# Patient Record
Sex: Female | Born: 1950 | Race: Black or African American | Hispanic: No | Marital: Single | Smoking: Never smoker
Health system: Southern US, Community
[De-identification: ages and names within clinical notes are randomized; demographics above are authoritative.]

## PROBLEM LIST (undated history)

## (undated) DIAGNOSIS — M797 Fibromyalgia: Secondary | ICD-10-CM

## (undated) DIAGNOSIS — G4730 Sleep apnea, unspecified: Secondary | ICD-10-CM

## (undated) DIAGNOSIS — E785 Hyperlipidemia, unspecified: Secondary | ICD-10-CM

## (undated) DIAGNOSIS — M545 Low back pain, unspecified: Secondary | ICD-10-CM

## (undated) DIAGNOSIS — Z8489 Family history of other specified conditions: Secondary | ICD-10-CM

## (undated) DIAGNOSIS — G8929 Other chronic pain: Secondary | ICD-10-CM

## (undated) DIAGNOSIS — E1142 Type 2 diabetes mellitus with diabetic polyneuropathy: Secondary | ICD-10-CM

## (undated) DIAGNOSIS — D573 Sickle-cell trait: Secondary | ICD-10-CM

## (undated) DIAGNOSIS — T4145XA Adverse effect of unspecified anesthetic, initial encounter: Secondary | ICD-10-CM

## (undated) DIAGNOSIS — I10 Essential (primary) hypertension: Secondary | ICD-10-CM

## (undated) DIAGNOSIS — F32A Depression, unspecified: Secondary | ICD-10-CM

## (undated) DIAGNOSIS — F329 Major depressive disorder, single episode, unspecified: Secondary | ICD-10-CM

## (undated) DIAGNOSIS — T8859XA Other complications of anesthesia, initial encounter: Secondary | ICD-10-CM

## (undated) DIAGNOSIS — E119 Type 2 diabetes mellitus without complications: Secondary | ICD-10-CM

## (undated) DIAGNOSIS — J45909 Unspecified asthma, uncomplicated: Secondary | ICD-10-CM

## (undated) DIAGNOSIS — R51 Headache: Secondary | ICD-10-CM

## (undated) DIAGNOSIS — F419 Anxiety disorder, unspecified: Secondary | ICD-10-CM

## (undated) DIAGNOSIS — J189 Pneumonia, unspecified organism: Secondary | ICD-10-CM

## (undated) DIAGNOSIS — F40240 Claustrophobia: Secondary | ICD-10-CM

## (undated) DIAGNOSIS — F319 Bipolar disorder, unspecified: Secondary | ICD-10-CM

## (undated) DIAGNOSIS — R519 Headache, unspecified: Secondary | ICD-10-CM

## (undated) DIAGNOSIS — D509 Iron deficiency anemia, unspecified: Secondary | ICD-10-CM

## (undated) DIAGNOSIS — K219 Gastro-esophageal reflux disease without esophagitis: Secondary | ICD-10-CM

## (undated) DIAGNOSIS — M1990 Unspecified osteoarthritis, unspecified site: Secondary | ICD-10-CM

## (undated) HISTORY — PX: ABDOMINAL HYSTERECTOMY: SHX81

## (undated) HISTORY — PX: LAPAROSCOPIC CHOLECYSTECTOMY: SUR755

## (undated) HISTORY — PX: TONSILLECTOMY AND ADENOIDECTOMY: SUR1326

## (undated) HISTORY — PX: TOE AMPUTATION: SHX809

## (undated) HISTORY — PX: APPENDECTOMY: SHX54

## (undated) HISTORY — PX: CATARACT EXTRACTION W/ INTRAOCULAR LENS  IMPLANT, BILATERAL: SHX1307

## (undated) HISTORY — PX: FRACTURE SURGERY: SHX138

## (undated) HISTORY — PX: OPEN REDUCTION INTERNAL FIXATION (ORIF) TIBIA/FIBULA FRACTURE: SHX5992

---

## 2013-05-09 HISTORY — PX: FOOT SURGERY: SHX648

## 2014-04-08 HISTORY — PX: FOOT SURGERY: SHX648

## 2016-04-25 ENCOUNTER — Encounter (HOSPITAL_COMMUNITY): Payer: Self-pay | Admitting: Emergency Medicine

## 2016-04-25 DIAGNOSIS — Z888 Allergy status to other drugs, medicaments and biological substances status: Secondary | ICD-10-CM

## 2016-04-25 DIAGNOSIS — Z961 Presence of intraocular lens: Secondary | ICD-10-CM | POA: Diagnosis present

## 2016-04-25 DIAGNOSIS — Z90710 Acquired absence of both cervix and uterus: Secondary | ICD-10-CM

## 2016-04-25 DIAGNOSIS — M86672 Other chronic osteomyelitis, left ankle and foot: Secondary | ICD-10-CM | POA: Diagnosis present

## 2016-04-25 DIAGNOSIS — Z8249 Family history of ischemic heart disease and other diseases of the circulatory system: Secondary | ICD-10-CM

## 2016-04-25 DIAGNOSIS — L97429 Non-pressure chronic ulcer of left heel and midfoot with unspecified severity: Secondary | ICD-10-CM | POA: Diagnosis present

## 2016-04-25 DIAGNOSIS — D573 Sickle-cell trait: Secondary | ICD-10-CM | POA: Diagnosis present

## 2016-04-25 DIAGNOSIS — E11621 Type 2 diabetes mellitus with foot ulcer: Secondary | ICD-10-CM | POA: Diagnosis present

## 2016-04-25 DIAGNOSIS — E1169 Type 2 diabetes mellitus with other specified complication: Principal | ICD-10-CM | POA: Diagnosis present

## 2016-04-25 DIAGNOSIS — Z833 Family history of diabetes mellitus: Secondary | ICD-10-CM

## 2016-04-25 DIAGNOSIS — G4730 Sleep apnea, unspecified: Secondary | ICD-10-CM | POA: Diagnosis present

## 2016-04-25 DIAGNOSIS — Z79899 Other long term (current) drug therapy: Secondary | ICD-10-CM

## 2016-04-25 DIAGNOSIS — Z9842 Cataract extraction status, left eye: Secondary | ICD-10-CM

## 2016-04-25 DIAGNOSIS — L089 Local infection of the skin and subcutaneous tissue, unspecified: Secondary | ICD-10-CM | POA: Diagnosis not present

## 2016-04-25 DIAGNOSIS — E11628 Type 2 diabetes mellitus with other skin complications: Secondary | ICD-10-CM | POA: Diagnosis present

## 2016-04-25 DIAGNOSIS — L97419 Non-pressure chronic ulcer of right heel and midfoot with unspecified severity: Secondary | ICD-10-CM | POA: Diagnosis present

## 2016-04-25 DIAGNOSIS — K219 Gastro-esophageal reflux disease without esophagitis: Secondary | ICD-10-CM | POA: Diagnosis present

## 2016-04-25 DIAGNOSIS — E1121 Type 2 diabetes mellitus with diabetic nephropathy: Secondary | ICD-10-CM | POA: Diagnosis present

## 2016-04-25 DIAGNOSIS — I10 Essential (primary) hypertension: Secondary | ICD-10-CM | POA: Diagnosis present

## 2016-04-25 DIAGNOSIS — M21962 Unspecified acquired deformity of left lower leg: Secondary | ICD-10-CM | POA: Diagnosis present

## 2016-04-25 DIAGNOSIS — L97519 Non-pressure chronic ulcer of other part of right foot with unspecified severity: Secondary | ICD-10-CM | POA: Diagnosis present

## 2016-04-25 DIAGNOSIS — E1142 Type 2 diabetes mellitus with diabetic polyneuropathy: Secondary | ICD-10-CM | POA: Diagnosis present

## 2016-04-25 DIAGNOSIS — E1129 Type 2 diabetes mellitus with other diabetic kidney complication: Secondary | ICD-10-CM | POA: Diagnosis present

## 2016-04-25 DIAGNOSIS — Z880 Allergy status to penicillin: Secondary | ICD-10-CM

## 2016-04-25 DIAGNOSIS — M14679 Charcot's joint, unspecified ankle and foot: Secondary | ICD-10-CM | POA: Diagnosis present

## 2016-04-25 DIAGNOSIS — E669 Obesity, unspecified: Secondary | ICD-10-CM | POA: Diagnosis present

## 2016-04-25 DIAGNOSIS — M797 Fibromyalgia: Secondary | ICD-10-CM | POA: Diagnosis present

## 2016-04-25 DIAGNOSIS — Z6838 Body mass index (BMI) 38.0-38.9, adult: Secondary | ICD-10-CM

## 2016-04-25 DIAGNOSIS — L97529 Non-pressure chronic ulcer of other part of left foot with unspecified severity: Secondary | ICD-10-CM | POA: Diagnosis present

## 2016-04-25 DIAGNOSIS — Z794 Long term (current) use of insulin: Secondary | ICD-10-CM

## 2016-04-25 DIAGNOSIS — Z9841 Cataract extraction status, right eye: Secondary | ICD-10-CM

## 2016-04-25 NOTE — ED Notes (Signed)
Due to having small veins and patient states she is a hard stick she opted to have her blood drawn when she has her IV started.

## 2016-04-25 NOTE — ED Triage Notes (Signed)
Patient with bilateral foot pain.  She states that she is a diabetic and that she has had surgery on both of the feet.  She states that the right foot pain is in the middle near the incision where it is opening up.  The left foot is also having pain and there is an opening on the heel.  Patient just rode 18hrs from [place].

## 2016-04-26 ENCOUNTER — Emergency Department (HOSPITAL_COMMUNITY): Payer: Medicare Other

## 2016-04-26 ENCOUNTER — Inpatient Hospital Stay (HOSPITAL_COMMUNITY)
Admission: EM | Admit: 2016-04-26 | Discharge: 2016-04-29 | DRG: 629 | Disposition: A | Discharge status: Home / Self Care | Payer: Medicare Other | Attending: Internal Medicine | Admitting: Internal Medicine

## 2016-04-26 ENCOUNTER — Inpatient Hospital Stay (HOSPITAL_COMMUNITY): Payer: Medicare Other

## 2016-04-26 ENCOUNTER — Encounter (HOSPITAL_COMMUNITY): Payer: Self-pay | Admitting: Internal Medicine

## 2016-04-26 DIAGNOSIS — I10 Essential (primary) hypertension: Secondary | ICD-10-CM | POA: Diagnosis present

## 2016-04-26 DIAGNOSIS — L089 Local infection of the skin and subcutaneous tissue, unspecified: Secondary | ICD-10-CM | POA: Diagnosis present

## 2016-04-26 DIAGNOSIS — M14679 Charcot's joint, unspecified ankle and foot: Secondary | ICD-10-CM | POA: Diagnosis present

## 2016-04-26 DIAGNOSIS — Z833 Family history of diabetes mellitus: Secondary | ICD-10-CM | POA: Diagnosis not present

## 2016-04-26 DIAGNOSIS — M797 Fibromyalgia: Secondary | ICD-10-CM | POA: Diagnosis present

## 2016-04-26 DIAGNOSIS — L97426 Non-pressure chronic ulcer of left heel and midfoot with bone involvement without evidence of necrosis: Secondary | ICD-10-CM

## 2016-04-26 DIAGNOSIS — E11621 Type 2 diabetes mellitus with foot ulcer: Secondary | ICD-10-CM | POA: Diagnosis present

## 2016-04-26 DIAGNOSIS — E11628 Type 2 diabetes mellitus with other skin complications: Secondary | ICD-10-CM | POA: Diagnosis present

## 2016-04-26 DIAGNOSIS — E1142 Type 2 diabetes mellitus with diabetic polyneuropathy: Secondary | ICD-10-CM | POA: Diagnosis present

## 2016-04-26 DIAGNOSIS — L97429 Non-pressure chronic ulcer of left heel and midfoot with unspecified severity: Secondary | ICD-10-CM | POA: Diagnosis present

## 2016-04-26 DIAGNOSIS — M21962 Unspecified acquired deformity of left lower leg: Secondary | ICD-10-CM | POA: Diagnosis present

## 2016-04-26 DIAGNOSIS — L0889 Other specified local infections of the skin and subcutaneous tissue: Secondary | ICD-10-CM | POA: Diagnosis not present

## 2016-04-26 DIAGNOSIS — D573 Sickle-cell trait: Secondary | ICD-10-CM | POA: Diagnosis present

## 2016-04-26 DIAGNOSIS — Z880 Allergy status to penicillin: Secondary | ICD-10-CM | POA: Diagnosis not present

## 2016-04-26 DIAGNOSIS — M869 Osteomyelitis, unspecified: Secondary | ICD-10-CM | POA: Diagnosis present

## 2016-04-26 DIAGNOSIS — M86672 Other chronic osteomyelitis, left ankle and foot: Secondary | ICD-10-CM | POA: Diagnosis present

## 2016-04-26 DIAGNOSIS — E669 Obesity, unspecified: Secondary | ICD-10-CM | POA: Diagnosis present

## 2016-04-26 DIAGNOSIS — Z888 Allergy status to other drugs, medicaments and biological substances status: Secondary | ICD-10-CM | POA: Diagnosis not present

## 2016-04-26 DIAGNOSIS — Z8249 Family history of ischemic heart disease and other diseases of the circulatory system: Secondary | ICD-10-CM | POA: Diagnosis not present

## 2016-04-26 DIAGNOSIS — Z794 Long term (current) use of insulin: Secondary | ICD-10-CM | POA: Diagnosis not present

## 2016-04-26 DIAGNOSIS — G4730 Sleep apnea, unspecified: Secondary | ICD-10-CM | POA: Diagnosis present

## 2016-04-26 DIAGNOSIS — Z79899 Other long term (current) drug therapy: Secondary | ICD-10-CM | POA: Diagnosis not present

## 2016-04-26 DIAGNOSIS — Z6838 Body mass index (BMI) 38.0-38.9, adult: Secondary | ICD-10-CM | POA: Diagnosis not present

## 2016-04-26 DIAGNOSIS — E1121 Type 2 diabetes mellitus with diabetic nephropathy: Secondary | ICD-10-CM | POA: Diagnosis present

## 2016-04-26 DIAGNOSIS — E1129 Type 2 diabetes mellitus with other diabetic kidney complication: Secondary | ICD-10-CM | POA: Diagnosis present

## 2016-04-26 DIAGNOSIS — L03116 Cellulitis of left lower limb: Secondary | ICD-10-CM

## 2016-04-26 DIAGNOSIS — E1169 Type 2 diabetes mellitus with other specified complication: Secondary | ICD-10-CM | POA: Diagnosis present

## 2016-04-26 DIAGNOSIS — L97419 Non-pressure chronic ulcer of right heel and midfoot with unspecified severity: Secondary | ICD-10-CM | POA: Diagnosis present

## 2016-04-26 DIAGNOSIS — K219 Gastro-esophageal reflux disease without esophagitis: Secondary | ICD-10-CM | POA: Diagnosis present

## 2016-04-26 DIAGNOSIS — L97511 Non-pressure chronic ulcer of other part of right foot limited to breakdown of skin: Secondary | ICD-10-CM

## 2016-04-26 HISTORY — DX: Pneumonia, unspecified organism: J18.9

## 2016-04-26 HISTORY — DX: Fibromyalgia: M79.7

## 2016-04-26 HISTORY — DX: Depression, unspecified: F32.A

## 2016-04-26 HISTORY — DX: Claustrophobia: F40.240

## 2016-04-26 HISTORY — DX: Headache, unspecified: R51.9

## 2016-04-26 HISTORY — DX: Headache: R51

## 2016-04-26 HISTORY — DX: Major depressive disorder, single episode, unspecified: F32.9

## 2016-04-26 HISTORY — DX: Other chronic pain: G89.29

## 2016-04-26 HISTORY — DX: Gastro-esophageal reflux disease without esophagitis: K21.9

## 2016-04-26 HISTORY — DX: Family history of other specified conditions: Z84.89

## 2016-04-26 HISTORY — DX: Unspecified asthma, uncomplicated: J45.909

## 2016-04-26 HISTORY — DX: Unspecified osteoarthritis, unspecified site: M19.90

## 2016-04-26 HISTORY — DX: Anxiety disorder, unspecified: F41.9

## 2016-04-26 HISTORY — DX: Type 2 diabetes mellitus without complications: E11.9

## 2016-04-26 HISTORY — DX: Type 2 diabetes mellitus with diabetic polyneuropathy: E11.42

## 2016-04-26 HISTORY — DX: Low back pain, unspecified: M54.50

## 2016-04-26 HISTORY — DX: Other complications of anesthesia, initial encounter: T88.59XA

## 2016-04-26 HISTORY — DX: Adverse effect of unspecified anesthetic, initial encounter: T41.45XA

## 2016-04-26 HISTORY — DX: Bipolar disorder, unspecified: F31.9

## 2016-04-26 HISTORY — DX: Sleep apnea, unspecified: G47.30

## 2016-04-26 HISTORY — DX: Essential (primary) hypertension: I10

## 2016-04-26 HISTORY — DX: Sickle-cell trait: D57.3

## 2016-04-26 HISTORY — DX: Low back pain: M54.5

## 2016-04-26 LAB — LACTIC ACID, PLASMA
Lactic Acid, Venous: 0.9 mmol/L (ref 0.5–1.9)
Lactic Acid, Venous: 0.6 mmol/L (ref 0.5–1.9)

## 2016-04-26 LAB — COMPREHENSIVE METABOLIC PANEL
ALT: 9 U/L — AB (ref 14–54)
AST: 20 U/L (ref 15–41)
Albumin: 3 g/dL — ABNORMAL LOW (ref 3.5–5.0)
Alkaline Phosphatase: 70 U/L (ref 38–126)
Anion gap: 13 (ref 5–15)
BUN: 31 mg/dL — AB (ref 6–20)
CHLORIDE: 103 mmol/L (ref 101–111)
CO2: 20 mmol/L — ABNORMAL LOW (ref 22–32)
CREATININE: 1.77 mg/dL — AB (ref 0.44–1.00)
Calcium: 9.2 mg/dL (ref 8.9–10.3)
GFR calc Af Amer: 34 mL/min — ABNORMAL LOW (ref >60)
GFR, EST NON AFRICAN AMERICAN: 29 mL/min — AB (ref >60)
Glucose, Bld: 130 mg/dL — ABNORMAL HIGH (ref 65–99)
Potassium: 4 mmol/L (ref 3.5–5.1)
Sodium: 136 mmol/L (ref 135–145)
Total Bilirubin: 0.6 mg/dL (ref 0.3–1.2)
Total Protein: 7.8 g/dL (ref 6.5–8.1)

## 2016-04-26 LAB — CBC WITH DIFFERENTIAL/PLATELET
BASOS ABS: 0.1 10*3/uL (ref 0.0–0.1)
Basophils Relative: 0 %
EOS PCT: 4 %
Eosinophils Absolute: 0.5 10*3/uL (ref 0.0–0.7)
HEMATOCRIT: 32 % — AB (ref 36.0–46.0)
HEMOGLOBIN: 10.5 g/dL — AB (ref 12.0–15.0)
LYMPHS PCT: 12 %
Lymphs Abs: 1.6 10*3/uL (ref 0.7–4.0)
MCH: 25.5 pg — ABNORMAL LOW (ref 26.0–34.0)
MCHC: 32.8 g/dL (ref 30.0–36.0)
MCV: 77.7 fL — AB (ref 78.0–100.0)
Monocytes Absolute: 0.8 10*3/uL (ref 0.1–1.0)
Monocytes Relative: 6 %
NEUTROS ABS: 10.3 10*3/uL — AB (ref 1.7–7.7)
NEUTROS PCT: 78 %
PLATELETS: 390 10*3/uL (ref 150–400)
RBC: 4.12 MIL/uL (ref 3.87–5.11)
RDW: 16.2 % — ABNORMAL HIGH (ref 11.5–15.5)
WBC: 13.2 10*3/uL — AB (ref 4.0–10.5)

## 2016-04-26 LAB — HEMOGLOBIN A1C
Hgb A1c MFr Bld: 7.6 % — ABNORMAL HIGH (ref 4.8–5.6)
MEAN PLASMA GLUCOSE: 171 mg/dL

## 2016-04-26 LAB — CBG MONITORING, ED: GLUCOSE-CAPILLARY: 216 mg/dL — AB (ref 65–99)

## 2016-04-26 LAB — SEDIMENTATION RATE: Sed Rate: 116 mm/hr — ABNORMAL HIGH (ref 0–22)

## 2016-04-26 LAB — GLUCOSE, CAPILLARY: GLUCOSE-CAPILLARY: 189 mg/dL — AB (ref 65–99)

## 2016-04-26 MED ORDER — VANCOMYCIN HCL IN DEXTROSE 1-5 GM/200ML-% IV SOLN
1000.0000 mg | Freq: Once | INTRAVENOUS | Status: AC
  Administered 2016-04-26: 1000 mg via INTRAVENOUS
  Filled 2016-04-26: qty 200

## 2016-04-26 MED ORDER — DEXTROSE 5 % IV SOLN
1.0000 g | Freq: Once | INTRAVENOUS | Status: AC
  Administered 2016-04-26: 1 g via INTRAVENOUS
  Filled 2016-04-26: qty 1

## 2016-04-26 MED ORDER — SODIUM CHLORIDE 0.9 % IV BOLUS (SEPSIS)
500.0000 mL | Freq: Once | INTRAVENOUS | Status: AC
  Administered 2016-04-26: 500 mL via INTRAVENOUS

## 2016-04-26 MED ORDER — INSULIN ASPART 100 UNIT/ML ~~LOC~~ SOLN
0.0000 [IU] | Freq: Three times a day (TID) | SUBCUTANEOUS | Status: DC
  Administered 2016-04-26: 2 [IU] via SUBCUTANEOUS
  Administered 2016-04-26: 3 [IU] via SUBCUTANEOUS
  Administered 2016-04-27 – 2016-04-29 (×5): 1 [IU] via SUBCUTANEOUS
  Filled 2016-04-26: qty 1

## 2016-04-26 MED ORDER — INSULIN ASPART 100 UNIT/ML ~~LOC~~ SOLN: 0.0000 [IU] | Freq: Every day | SUBCUTANEOUS | Status: DC

## 2016-04-26 MED ORDER — ACETAMINOPHEN 325 MG PO TABS: 650.0000 mg | ORAL_TABLET | Freq: Four times a day (QID) | ORAL | Status: DC | PRN

## 2016-04-26 MED ORDER — ENSURE ENLIVE PO LIQD
237.0000 mL | Freq: Two times a day (BID) | ORAL | Status: DC
  Administered 2016-04-27: 237 mL via ORAL

## 2016-04-26 MED ORDER — HEPARIN SODIUM (PORCINE) 5000 UNIT/ML IJ SOLN
5000.0000 [IU] | Freq: Three times a day (TID) | INTRAMUSCULAR | Status: DC
  Administered 2016-04-26 – 2016-04-29 (×8): 5000 [IU] via SUBCUTANEOUS
  Filled 2016-04-26 (×8): qty 1

## 2016-04-26 MED ORDER — HYDROCODONE-ACETAMINOPHEN 10-325 MG PO TABS
1.0000 | ORAL_TABLET | ORAL | Status: DC | PRN
  Administered 2016-04-26 – 2016-04-28 (×8): 1 via ORAL
  Filled 2016-04-26 (×8): qty 1

## 2016-04-26 MED ORDER — HYDROMORPHONE HCL 2 MG/ML IJ SOLN
0.5000 mg | Freq: Once | INTRAMUSCULAR | Status: AC
  Administered 2016-04-26: 0.5 mg via INTRAVENOUS
  Filled 2016-04-26: qty 1

## 2016-04-26 NOTE — Consult Note (Signed)
WOC Nurse wound consult note Reason for Consult:  Neuropathic foot ulcers  Treated by podiatrist in [place], has had HBO treatments to the left foot wound, has had NPWT VAC therapy to the left foot in the past. Wounds are chronic in nature both present greater than a year-2016 (right foot), 2015 (left foot). Foot deformity of left foot. Patient has not ever had forced compliance for offloading these ulcers, when I dicussed Total Contact casting she is not familiar with this treatment at all.  Wound type: neuropathic foot ulcers right and left  Measurement: Left foot: 2.0cm x 1.5cm x 3.5cm tunnel centrally Right foot: 3cm x3cm x 2.5cm  Wound bed: Left foot: clean-however tunnels and patient was not aware of that tunnel reports that MD was not aware of the tunnel. Some thicker yellow drainage in this tunnel, no odor Right foot wound, deeper, clean, non granular with some darkening of the wound edges consistent with hyperkertotic skin, does not appear necrotic. Not able to assess drainage, no dressing in place when I arrived.  Drainage (amount, consistency, odor) see above  Periwound: foot deformity noted left foot Dressing procedure/placement/frequency: Silver hydrofiber packed into tunneled area on the left foot and to the remainder of the wound bed for antimicrobial effects, and drainage, and recalcitrant wounds, wrap with kerlix and Coban. Silver hydrofiber packed into the wound on the right foot wound, cover with dry dressing, secure with kerlix and Coban.  CM will need to arrange follow up with wound care center here locally, she has just moved here from [place].  Patient has not had any form of total contact casting to force compliance with offloading of these ulcers in the past.  Discussed POC with patient and bedside nurse.  Re consult if needed, will not follow at this time. Thanks  Sara Keys [employer] MSN, RN,CWOCN, CNS [phone])

## 2016-04-26 NOTE — ED Notes (Signed)
Patient transported to X-ray 

## 2016-04-26 NOTE — Progress Notes (Signed)
Received medical chart from Dr. Kaleen Maskennis Marlin Chaney, DPM's office from [place].   Patient has had wound care every MWF with endoform and hydrofera blue. Saw this physician last on 04/15/16. Was given 14 day course of cefexime 100mg  bid and had ulcer debridgement of the right plantar surface wound. Also had previous debridements on 11/20 and 11/03 and 10/09.   Med list: clonidine, lisinopril, xanax, gabapentin, norco, lantus, iron, suprax, dosage is not provided.   Allergies: biaxin, erythromycin, feldene, tetracycline, penicillin, latex.    I have updated these information in the chart.

## 2016-04-26 NOTE — Care Management Note (Signed)
Case Management Note  Patient Details  Name: Diane MillinLinda K [name] MRN: [id number] Date of Birth: 1950/11/27  Subjective/Objective:                  65 yo female patient presents with complaint of foot wound, increasing pain, drainage and redness x 3 weeks. /[place] [facility] [place]  Action/Plan: Follow for disposition needs./ Admit to INPATIENT; possible home health needs and establishing PCP in [place].   Expected Discharge Date:  04/30/16               Expected Discharge Plan:  Home w Home Health Services  In-House Referral:  NA  Discharge planning Services  CM Consult  Post Acute Care Choice:  NA Choice offered to:  NA  DME Arranged:  N/A DME Agency:  NA  HH Arranged:  NA HH Agency:  NA  Status of Service:  In process, will continue to follow  If discussed at Long Length of Stay Meetings, dates discussed:    Additional Comments:  Oletta CohnWood, Tadan Shill, RN 04/26/2016, 9:34 AM

## 2016-04-26 NOTE — ED Provider Notes (Signed)
[facility]-EMER[facility]ENCY DEPT Provider Note   CSN: [id number] Arrival date & time: 04/25/16  2333     History   Chief Complaint Chief Complaint  Patient presents with  . Foot Pain    HPI Diane [name] is a 65 y.o. female.  Patient with a history of DM, HTN, diabetic neuropathy, fibromyalgia, arthritis presents with concern for left foot ulceration that has become progressively painful, with drainage over the last 3 weeks. She rode from [place] in a truck where she states there was heat directly on the sore area of the foot for the entire 18 hours ride and when she arrived here the pain was greater and there was dark "black" discharge on the bandage. She came straight to the hospital for evaluation. Until she left [place] to move here, her foot was being cared for by a podiatrist with frequent dressing changes with x3 per week in-home care. She denies awareness of any fever. No nausea or vomiting.    The history is provided by the patient. No language interpreter was used.  Foot Pain  Pertinent negatives include no chest pain and no shortness of breath.    Past Medical History:  Diagnosis Date  . Diabetes mellitus without complication (HCC)   . Hypertension     There are no active problems to display for this patient.   History reviewed. No pertinent surgical history.  OB History    No data available       Home Medications    Prior to Admission medications   Not on File    Family History No family history on file.  Social History Social History  Substance Use Topics  . Smoking status: Never Smoker  . Smokeless tobacco: Never Used  . Alcohol use No     Allergies   Biaxin [clarithromycin]; Erythromycin; Nsaids; Penicillins; and Tetracyclines & related   Review of Systems Review of Systems  Constitutional: Negative for chills and fever.  Respiratory: Negative.  Negative for shortness of breath.   Cardiovascular: Negative.  Negative for chest pain.    Gastrointestinal: Negative.  Negative for nausea and vomiting.  Musculoskeletal:       See HPI.  Skin: Positive for wound.  Neurological: Negative.  Negative for weakness.     Physical Exam Updated Vital Signs BP (!) 167/115 (BP Location: Right Arm)   Pulse 112   Temp 100.2 F (37.9 C) (Oral)   Resp 16   SpO2 98%   Physical Exam  Constitutional: She is oriented to person, place, and time. She appears well-developed and well-nourished. No distress.  HENT:  Head: Normocephalic.  Neck: Normal range of motion. Neck supple.  Cardiovascular: Normal rate and regular rhythm.   Pulmonary/Chest: Effort normal and breath sounds normal. She has no wheezes. She has no rales.  Abdominal: Soft. Bowel sounds are normal. There is no tenderness. There is no rebound and no guarding.  Musculoskeletal: Normal range of motion.  Chronic open wound plantar right foot without sign of infection. (See inserted picture). Left foot has a chronic valgus deformity of ankle. There is a full thickness ulceration to medial left heal with expressable purulent drainage. There is surrounding erythema and tenderness.   Neurological: She is alert and oriented to person, place, and time.  Skin: Skin is warm and dry. No rash noted.  Psychiatric: She has a normal mood and affect.     ED Treatments / Results  Labs (all labs ordered are listed, but only abnormal results are displayed) Labs  Reviewed  CBC WITH DIFFERENTIAL/PLATELET - Abnormal; Notable for the following:       Result Value   WBC 13.2 (*)    Hemoglobin 10.5 (*)    HCT 32.0 (*)    MCV 77.7 (*)    MCH 25.5 (*)    RDW 16.2 (*)    Neutro Abs 10.3 (*)    All other components within normal limits  COMPREHENSIVE METABOLIC PANEL - Abnormal; Notable for the following:    CO2 20 (*)    Glucose, Bld 130 (*)    BUN 31 (*)    Creatinine, Ser 1.77 (*)    Albumin 3.0 (*)    ALT 9 (*)    GFR calc non Af Amer 29 (*)    GFR calc Af Amer 34 (*)    All other  components within normal limits  LACTIC ACID, PLASMA  LACTIC ACID, PLASMA    EKG  EKG Interpretation None       Radiology No results found.  Procedures Procedures (including critical care time)  Medications Ordered in ED Medications  sodium chloride 0.9 % bolus 500 mL (not administered)     Initial Impression / Assessment and Plan / ED Course  I have reviewed the triage vital signs and the nursing notes.  Pertinent labs & imaging results that were available during my care of the patient were reviewed by me and considered in my medical decision making (see chart for details).  Clinical Course     Patient presents with complaint of foot wound, increasing pain, drainage and redness x 3 weeks. On antibiotics (Suprax) without improvement. Arrives mildly tachycardic, low grade fever but does not appear septic. Lactate normal.   IV abx started per pharmacy recommendation referring to patient's allergy list. Patient updated. Discussed admission with OPC (unassigned) who accepts the patient onto their service.   Final Clinical Impressions(s) / ED Diagnoses   Final diagnoses:  None   1. Left foot cellulitis 2. Diabetic neuropathy.  New Prescriptions New Prescriptions   No medications on file     Elpidio AnisShari Bonnee Zertuche, Cordelia Poche-C 04/26/16 0731    Zadie Rhineonald Wickline, MD 04/26/16 [phone]

## 2016-04-26 NOTE — ED Notes (Signed)
Pt [facility] another stick [facility]

## 2016-04-26 NOTE — ED Provider Notes (Signed)
     She reports wound on left foot is worsening with pain/redness/drainage Will admit  Patient gave verbal permission to utilize photo for medical documentation only The image was not stored on any personal device     Zadie Rhineonald Leslieanne Cobarrubias, MD 04/26/16 0410

## 2016-04-26 NOTE — ED Notes (Signed)
Pt used bedside commode.

## 2016-04-26 NOTE — H&P (Signed)
Date: 04/26/2016               Patient Name:  Diane [name] MRN: [id number]  DOB: 06/23/1950 Age / Sex: 65 y.o., female   PCP: No Pcp Per Patient         Medical Service: Internal Medicine Teaching Service         Attending Physician: Dr. Lucious Groves, DO    First Contact: Dr. Lovena Le Pager: [phone]  Second Contact: Dr. Marlowe Sax Pager: [phone]       After Hours (After 5p/  First Contact Pager: [phone]  weekends / holidays): Second Contact Pager: [phone]   Chief Complaint: feet infection  History of Present Illness:   65 yo female with DM II, diabetic polyneuropathy, HTN, kidney disease with unknown b/l crt, fibromyalgia presents here with feet infection. She had surgery on both feet in the past, left side was operated on 2015 and right side on December 29th 2016. Patient does not clearly know the reason for the surgery. 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 She lacks sensation on both lower exts upto her ankle. After surgery she was doing well as she was not putting that much weight on her feet. However, recently she wanted to move to [facility] from [place], [place] to live with her God son here. She was doing packing and noticed for the last several days her previous incision site on right foot started to open up and have purulent drainage. It worsened during her trip to [facility]. She got here yesterday and came straight to the hospital.  She was doing wound care on her own, had Home health previously to help her. She also noticed she had an ulcer on her left heel around the same time. She tooks some antibiotics from her surgeon due to this. Unclear which one. She does not remember all of her meds except knowing that she takes gabapentin and norco for her chronic neuropathic pain.   Denies any fever, n,v, diarrhea, or any other complaints. Denies any particular injury to the feet other than putting pressure on them for packing and moving.    I called  Dr. Harold Hedge practice in [place]. RN could not give me much history over the phone but she states she will fax some information over to [place].    Meds:  Current Meds  Medication Sig  . insulin aspart (NOVOLOG) 100 UNIT/ML injection Inject 1 Units into the skin 3 (three) times daily before meals.     Allergies: Allergies as of 04/25/2016 - Review Complete 04/25/2016  Allergen Reaction Noted  . Biaxin [clarithromycin]  04/25/2016  . Erythromycin  04/25/2016  . Nsaids  04/25/2016  . Penicillins  04/25/2016  . Tetracyclines & related  04/25/2016   Past Medical History:  Diagnosis Date  . Diabetes mellitus without complication ([place])   . Diabetic polyneuropathy ([place])   . Fibromyalgia   . Hypertension     Family History: Mother had heart disease, father had diabetes. No cancer hx in the family.   Social History:  Social History   Social History  . Marital status: Single    Spouse name: N/A  . Number of children: N/A  . Years of education: N/A   Social History Main Topics  . Smoking status: Never Smoker  . Smokeless tobacco: Never Used  . Alcohol use No  . Drug use: No  . Sexual activity: Not Asked   Other Topics  Concern  . None   Social History Narrative  . None     Review of Systems: Review of Systems  Constitutional: Negative for chills and fever.  Respiratory: Negative for cough and hemoptysis.   Cardiovascular: Negative for chest pain and palpitations.  Gastrointestinal: Negative for abdominal pain, heartburn, nausea and vomiting.  Genitourinary: Negative for dysuria.  Skin: Negative for rash.       Wounds on both feet.  Neurological: Negative for dizziness, sensory change and headaches.     Physical Exam: Blood pressure 123/60, pulse 86, temperature 100.2 F (37.9 C), temperature source Oral, resp. rate 18, SpO2 (!) 88 %. Physical Exam  Constitutional: She is oriented to person, place, and time. She appears well-developed and well-nourished.  Obese  female. NAD.  HENT:  Head: Normocephalic and atraumatic.  Eyes: Conjunctivae are normal. Pupils are equal, round, and reactive to light. Right eye exhibits no discharge. Left eye exhibits no discharge. No scleral icterus.  Neck: Normal range of motion.  Cardiovascular: Normal rate and regular rhythm.  Exam reveals no gallop and no friction rub.   No murmur heard. Respiratory: Effort normal and breath sounds normal. No respiratory distress. She has no wheezes.  GI: Soft. Bowel sounds are normal.  Musculoskeletal:  Right foot plantar surface has 3x2 opening at a previous incision site with serosanguinous drainage. Visible deeper muscle layers.   Left heel has ulcer with some yellow drainage.  Pulses intact distally bilaterally.    Neurological: She is alert and oriented to person, place, and time.  Lacks pinprick sensation upto her ankles, Left worse than right.  Also lacks pinprick sensation on both hands.          EKG: none  CXR: none  Left foot xray IMPRESSION: 1. Soft tissue ulceration at the posterior aspect of left foot/heel. No radiographic evidence for acute osteitis. 2. Diffuse soft tissue swelling about the foot/lower leg. No dissecting soft tissue emphysema. 3. Extensive postsurgical changes about the left foot with associated deformity as above.   Assessment & Plan by Problem: Active Problems:   Diabetic foot ulcer ([place])   Hypertension   DM (diabetes mellitus) type II controlled with renal manifestation (HCC)   Fibromyalgia   Diabetic polyneuropathy ([place])  65 yo f with DM II and significant polyneuropathy here with bilateral feet infection, R foot is invading to deeper muscles.  Diabetic feet infection - right foot and left heel Has leukocytosis to 13k, Tmax 100.2, vitals otherwise stable. Xray foot of left shows soft tissue ulceration with soft tissue edema without signs of gas formation or osteomyelitis. Xray was not done on the right foot.  Her right  foot looks worrisome for deeper infection deeper muscle layers are visible from the opening. She had surgical incision on this area in the past in [place] which opened up with pressure during her move from [place] to [place]. - Will need surgical evaluation for right foot wound. Paged Ortho, awaiting for return call. - Will check ESR and CRP, if elevated, may consider MRI to rule out osteomyelitis - Will hold off further abx for now as she is not septic in the hope of getting good intra-op cultures  Her left heel has a diabetic ulcer without signs of deeper infection. Has some yellow drainage. Will need abx for no surgical need right now.  -will ask wound care to see her - hold further abx as above.   DM II with diabetic polyneuropathy unknown hgba1c - will check hgba1c - put  on SSI. Unclear what her home regimen is.    HTN - monitor for now. BP is normal. Not sure what her outpatient regimen is.  CKD vs AKI on CKD - unclear baseline creat. Monitor kidney function for now. 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 Avoid contrasts.     Dispo: Admit patient to Inpatient with expected length of stay greater than 2 midnights.   Signed: Dellia Nims, MD 04/26/2016, 9:40 AM  Pager: [phone]

## 2016-04-26 NOTE — ED Notes (Signed)
Spoke to nutritional services; meal in process

## 2016-04-26 NOTE — Progress Notes (Signed)
Diane [name] [id number] Admission Data: 04/26/2016 8:01 PM Attending Provider: Gust RungErik C Hoffman, DO  PCP:No PCP Per Patient Consults/ Treatment Team:   Diane MillinLinda K [name] is a 65 y.o. female patient admitted from ED awake, alert  & orientated  X 3,  Full Code, VSS - Blood pressure (!) 146/75, pulse 93, temperature 97.7 F (36.5 C), temperature source Oral, resp. rate 15, SpO2 97 %. no c/o shortness of breath, no c/o chest pain, no distress noted.   IV site WDL:  antecubital right, condition patent and no redness with a transparent dsg that's clean dry and intact. Antecubital left, condition patent and no redness with a transparent dsg that's clean dry and intact.   Allergies:   Allergies  Allergen Reactions  . Erythromycin Anaphylaxis  . Biaxin [Clarithromycin] Other (See Comments)  . Feldene [Piroxicam] Diarrhea, Nausea And Vomiting and Other (See Comments)    And sores in mouth  . Lasix [Furosemide] Other (See Comments)    Sores all over body  . Nsaids Other (See Comments)    Contraindicated because of her other meds  . Tetracyclines & Related   . Latex Rash  . Penicillins Rash and Other (See Comments)    Has patient had a PCN reaction causing immediate rash, facial/tongue/throat swelling, SOB or lightheadedness with hypotension: Yes Has patient had a PCN reaction causing severe rash involving mucus membranes or skin necrosis: No Has patient had a PCN reaction that required hospitalization No Has patient had a PCN reaction occurring within the last 10 years: No If all of the above answers are "NO", then may proceed with Cephalosporin use. And sores in her mouth   . Tape Rash     Past Medical History:  Diagnosis Date  . Anxiety   . Arthritis    "everywhere" (04/26/2016)  . Childhood asthma   . Chronic lower back pain   . Claustrophobia   . Complication of anesthesia    "had metallic taste in my mouth for a year after one of my ORs; next OR they changed some stuff; no  problems since" (04/26/2016)  . Depression   . Diabetic polyneuropathy (HCC)   . Family history of adverse reaction to anesthesia    "mom; don't have an idea what it was" (04/26/2016)  . Fibromyalgia   . GERD (gastroesophageal reflux disease)   . Headache    "related to stress" (04/26/2016)  . Hypertension   . Manic, depressive (HCC)    "severe" (04/26/2016)  . Pneumonia    "long time ago" (04/26/2016)  . Sickle cell trait (HCC)   . Sleep apnea    "mask ordered; too claustrophobic to wear" (04/26/2016)  . Type II diabetes mellitus (HCC)     History:  obtained from chart review. Tobacco/alcohol: denied none  Pt orientation to unit, room and routine. Information packet given to patient/family and safety video watched.  Admission INP armband ID verified with patient/family, and in place. SR up x 2, fall risk assessment complete with Patient and family verbalizing understanding of risks associated with falls. Pt verbalizes an understanding of how to use the call bell and to call for help before getting out of bed.  Skin, clean-dry- intact without evidence of bruising, or skin tears.   No evidence of skin break down noted on exam. hyperpigmentation -  generalized, scattered Both feet diabetic ulcers.     Will cont to monitor and assist as needed.  Camillo FlamingVicki L Jaqwan Wieber, RN 04/26/2016 8:01 PM

## 2016-04-27 ENCOUNTER — Inpatient Hospital Stay (HOSPITAL_COMMUNITY): Payer: Medicare Other

## 2016-04-27 DIAGNOSIS — Z89411 Acquired absence of right great toe: Secondary | ICD-10-CM

## 2016-04-27 DIAGNOSIS — E11628 Type 2 diabetes mellitus with other skin complications: Secondary | ICD-10-CM

## 2016-04-27 DIAGNOSIS — E11610 Type 2 diabetes mellitus with diabetic neuropathic arthropathy: Secondary | ICD-10-CM

## 2016-04-27 DIAGNOSIS — Z886 Allergy status to analgesic agent status: Secondary | ICD-10-CM

## 2016-04-27 DIAGNOSIS — E1142 Type 2 diabetes mellitus with diabetic polyneuropathy: Secondary | ICD-10-CM

## 2016-04-27 DIAGNOSIS — Z880 Allergy status to penicillin: Secondary | ICD-10-CM

## 2016-04-27 DIAGNOSIS — B9689 Other specified bacterial agents as the cause of diseases classified elsewhere: Secondary | ICD-10-CM

## 2016-04-27 DIAGNOSIS — N289 Disorder of kidney and ureter, unspecified: Secondary | ICD-10-CM

## 2016-04-27 DIAGNOSIS — E11621 Type 2 diabetes mellitus with foot ulcer: Secondary | ICD-10-CM | POA: Diagnosis present

## 2016-04-27 DIAGNOSIS — E1129 Type 2 diabetes mellitus with other diabetic kidney complication: Secondary | ICD-10-CM

## 2016-04-27 DIAGNOSIS — L97429 Non-pressure chronic ulcer of left heel and midfoot with unspecified severity: Secondary | ICD-10-CM

## 2016-04-27 DIAGNOSIS — Z8249 Family history of ischemic heart disease and other diseases of the circulatory system: Secondary | ICD-10-CM

## 2016-04-27 DIAGNOSIS — Z881 Allergy status to other antibiotic agents status: Secondary | ICD-10-CM

## 2016-04-27 DIAGNOSIS — L0889 Other specified local infections of the skin and subcutaneous tissue: Secondary | ICD-10-CM

## 2016-04-27 DIAGNOSIS — Z833 Family history of diabetes mellitus: Secondary | ICD-10-CM

## 2016-04-27 DIAGNOSIS — M869 Osteomyelitis, unspecified: Secondary | ICD-10-CM

## 2016-04-27 DIAGNOSIS — Z89412 Acquired absence of left great toe: Secondary | ICD-10-CM

## 2016-04-27 DIAGNOSIS — I10 Essential (primary) hypertension: Secondary | ICD-10-CM

## 2016-04-27 DIAGNOSIS — L97426 Non-pressure chronic ulcer of left heel and midfoot with bone involvement without evidence of necrosis: Secondary | ICD-10-CM

## 2016-04-27 LAB — GLUCOSE, CAPILLARY
GLUCOSE-CAPILLARY: 144 mg/dL — AB (ref 65–99)
GLUCOSE-CAPILLARY: 126 mg/dL — AB (ref 65–99)
Glucose-Capillary: 150 mg/dL — ABNORMAL HIGH (ref 65–99)
Glucose-Capillary: 143 mg/dL — ABNORMAL HIGH (ref 65–99)
Glucose-Capillary: 162 mg/dL — ABNORMAL HIGH (ref 65–99)

## 2016-04-27 MED ORDER — GADOBENATE DIMEGLUMINE 529 MG/ML IV SOLN
10.0000 mL | Freq: Once | INTRAVENOUS | Status: AC
  Administered 2016-04-27: 10 mL via INTRAVENOUS

## 2016-04-27 MED ORDER — ADULT MULTIVITAMIN W/MINERALS CH
1.0000 | ORAL_TABLET | Freq: Every day | ORAL | Status: DC
  Administered 2016-04-27 – 2016-04-29 (×2): 1 via ORAL
  Filled 2016-04-27 (×2): qty 1

## 2016-04-27 NOTE — Progress Notes (Signed)
Orthopedic Tech Progress Note Patient Details:  [facility]inda K [facility] [birth year] [id number]  Ortho Devices Type of Ortho Device:  (prafo boot) Ortho Device/Splint Location: lle Ortho Device/Splint Interventions: Application   Lynsay Fesperman 04/27/2016, 8:29 AM

## 2016-04-27 NOTE — Progress Notes (Signed)
   Subjective: No acute events overnight. Not in any pain this morning. Overall, doing well.  Objective:  Vital signs in last 24 hours: Vitals:   04/26/16 1624 04/26/16 2148 04/27/16 0526 04/27/16 0813  BP: (!) 146/75 (!) 135/54 (!) 154/64   Pulse: 93 84 91   Resp: '15 20 20   '$ Temp: 97.7 F (36.5 C) 98.6 F (37 C) 98.6 F (37 C)   TempSrc: Oral Oral Oral   SpO2: 97% 94% 98%   Weight:    202 lb 3.2 oz (91.7 kg)  Height:    '5\' 1"'$  (1.549 m)   Physical Exam  Constitutional: She is oriented to person, place, and time. She appears well-developed.  Obese  HENT:  Head: Normocephalic and atraumatic.  Poor oral dentition  Cardiovascular: Normal rate and regular rhythm.  Exam reveals no gallop and no friction rub.   No murmur heard. Respiratory: Effort normal and breath sounds normal. No respiratory distress. She has no wheezes.  GI: Soft. Bowel sounds are normal. She exhibits no distension. There is no tenderness.  Musculoskeletal:  Left leg in boot. Right leg with wound with pictures in the medical record.   Neurological: She is alert and oriented to person, place, and time.     Assessment/Plan:  Active Problems:   Diabetic foot ulcer ([place])   Hypertension   DM (diabetes mellitus) type II controlled with renal manifestation (HCC)   Fibromyalgia   Diabetic polyneuropathy (HCC)   Osteomyelitis of ankle or foot (HCC)   Diabetic ulcer of left heel associated with type 2 diabetes mellitus, with bone involvement without evidence of necrosis ([place])  65 yo f with DM II and significant polyneuropathy here with bilateral feet infection, R foot is invading to deeper muscles.  # Diabetic feet infection - right foot and left heel Has leukocytosis to 13k, Tmax 100.2, vitals otherwise stable. Currently, afebrile. Xray foot of left shows soft tissue ulceration with soft tissue edema without signs of gas formation or osteomyelitis. Xray was not done on the right foot. Seen by orthopedic surgery  who recommends MRI of both feet. She'll have these MRI today which will determine further management. We'll follow-up results of MRI and orthopedic surgery recommendations. -- Follow-up MRI results of both feet -- Follow-up orthopedic surgery recommendations -- ESR 116   # DM II with diabetic polyneuropathy  - will check hgba1c- 7.6 - put on SSI. Unclear what her home regimen is.  # HTN Mmonitor for now. BP slightly elevated. -- Continue to monitor -- Avoid angiotensin converting enzyme inhibitors or angiotensin receptor blockers in the setting of elevated creatinine  # CKD vs AKI on CKD -- unclear baseline creat. Monitor kidney function for now. 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 Avoid contrasts. -- BMP tomorrow  DVT/PE prophylaxis: Heparin FEN/GI: Carbohydrate modified diet  Dispo: Anticipated discharge pending clinical course and potential need for surgery.  Ophelia Shoulder, MD 04/27/2016, 11:48 AM Pager: [phone]

## 2016-04-27 NOTE — Progress Notes (Signed)
Initial Nutrition Assessment  DOCUMENTATION CODES:   Obesity unspecified  INTERVENTION:   -Snacks TID -MVI daily -D/c Ensure Enlive po BID, each supplement provides 350 kcal and 20 grams of protein  NUTRITION DIAGNOSIS:   Increased nutrient needs related to wound healing as evidenced by estimated needs.  GOAL:   Patient will meet greater than or equal to 90% of their needs  MONITOR:   PO intake, Supplement acceptance, Labs, Weight trends, Skin, I & O's  REASON FOR ASSESSMENT:   Malnutrition Screening Tool, Consult Assessment of nutrition requirement/status  ASSESSMENT:   65 yo f with DM II and significant polyneuropathy here with bilateral feet infection, R foot is invading to deeper muscles.  Pt admitted with DM foot ulcers.   Reviewed CWOCN note from 04/26/16; pt with neuropathic ulcers to rt foot and lt heel. Pt was previously treated at wound care center in [place].   Spoke with pt at bedside, who expressed concern over DM diet. She shares that over the past year she has been working on improving DM control to assist with wound healing. Pt shares that she has cut out soft drinks and many sweets and has been able to decrease CBGS from the 300's down into the 140's. Pt reports that she typically has a fair appetite, but intake was limited over the past 3 days due to stress. Pt also shares that she tends to have hunger pains between meals that she is unable to control. Per DM coordinator note, outpatient medication regimen is as follows: 14 units insulin BID and 1-12 units Novolog BID.   Pt denies any recent wt loss.   Nutrition-Focused physical exam completed. Findings are no fat depletion, no muscle depletion, and no edema.   Discussed with pt increased protein needs and optimizing DM control to support wound healing. Pt seemed surprised by this information despite the fact that that she verbalized both of this principles during the interview. Pt was consuming Ensure  supplement at time of visit, however, amenable to d/c due to high carbohydrate content. Offered other low carbohydrate supplements, however, pt declined ("Glucerna is too watery and run right through me" and did not want to try Prostat). Pt amenable to high protein, low carbohydrate snacks, which RD will order. Also spent time with pt choosing menu items that pt enjoyed that were within the confines of her diet restrictions.   Labs reviewed: CBGS: 150-216. Hgb A1c: 7.6.   Diet Order:  Diet Carb Modified Fluid consistency: Thin; Room service appropriate? Yes  Skin:  Wound (see comment) (neuropathic foot ulcers on rt foot and lt heel)  Last BM:  04/26/16  Height:   Ht Readings from Last 1 Encounters:  04/27/16 5\' 1"  (1.549 m)    Weight:   Wt Readings from Last 1 Encounters:  04/27/16 202 lb 3.2 oz (91.7 kg)    Ideal Body Weight:  47.7 kg  BMI:  Body mass index is 38.21 kg/m.  Estimated Nutritional Needs:   Kcal:  1600-1800  Protein:  95-110 grams  Fluid:  1.6-1.8 L  EDUCATION NEEDS:   Education needs addressed  Wynell Halberg A. Mayford KnifeWilliams, RD, LDN, CDE Pager: [phone] After hours Pager: [phone]

## 2016-04-27 NOTE — H&P (Signed)
Internal Medicine Attending Admission Note  I saw and evaluated the patient. I reviewed the resident's note and I agree with the resident's findings and plan as documented in the resident's note.  Assessment & Plan by Problem:   Diabetic foot ulcer ([place]) - Agree with consult to orthopaedics and appreciate Dr Jess Barters consult. - Agree with MRI especially given elevated ESR - Agree with D/C of antibiotics given in ED, she is not septic. - If MRI shows osteomyelitis will need surgery, if not we will need to get her into the [facility] for further management. -Appreciate wound care consult inpatient.    Hypertension - Patient currently mildly hypertensive with BP of 142/77 OK to hold BP medications but we will need to continue to monitor.    DM (diabetes mellitus) type II controlled with renal manifestation and  Diabetic polyneuropathy -A1c 7.6, currently managed with SSI and at inpatient goal with minimal SS given.  If she becomes more hyperglycemic will need to add long acting coverage but ok to monitor for now.  Chief Complaint(s): foot infection  History - key components related to admission: Briefly Diane [name] his 65 year old female with past medical history of type 2 diabetes, complicated by diabetic neuropathy, renal disease, Charcot foot, as well as hypertension and fibromyalgia. She has recently moved to [place] one day prior to presentation in the ED as she noticed increased drainage from her foot infections while she was unpacking. Her foot infections have previously been treated by a podiatrist in [facility]. And she has recently been taking an unknown antibiotic for this infection. She denies any fever or chills or other systemic symptoms of infection. She reports that her diabetes is well controlled, she does not remember the medications she takes exactly.  Lab results: Reviewed in Epic  Physical Exam - key components related to admission: General:  resting in bed Cardiac: RRR, no rubs, murmurs or gallops Pulm: clear to auscultation bilaterally, moving normal volumes of air Abd: soft, nontender, nondistended, BS present Ext: both feet are bandaged on my exam today, I reviewed pictures in EPIC.  Her lower legs do not have hair growth, I am able to palpate a DP pulse on the right, I did not attempt on the left due to her offloading boot has already been applied. Neuro: alert and oriented X3  Vitals:   04/26/16 2148 04/27/16 0526 04/27/16 0813 04/27/16 1401  BP: (!) 135/54 (!) 154/64  (!) 142/77  Pulse: 84 91  84  Resp: 20 20  18   Temp: 98.6 F (37 C) 98.6 F (37 C)  97.8 F (36.6 C)  TempSrc: Oral Oral    SpO2: 94% 98%  100%  Weight:   202 lb 3.2 oz (91.7 kg)   Height:   5' 1"  (1.549 m)

## 2016-04-27 NOTE — Care Management Note (Signed)
Case Management Note  Patient Details  Name: Diane MillinLinda K [name] MRN: [id number] Date of Birth: 1950-08-02  Subjective/Objective:                 Patient admitted from home for B diabetic foot wounds. Recently moved from [place]. Consulted about HH needs and PCP. Discussed IM clinic with patient (listed as attending) patient understands that she may not see same MD every visit, and would see resident physicians, she would like to continue with care from team after DC, as well as DR Lajoyce Cornersuda. She states she was active with [facility] in [place] [facility]. She states she also has a RW, L foot currently in walking boot. Harlow MaresGodson or friend in [place] will provide transportation to follow up appointments. Patient waiting for MRI to B feet to r/o osteo assess how extensive wounds are.   Action/Plan:  [facility].   Expected Discharge Date:  04/30/16               Expected Discharge Plan:  Home w Home Health Services  In-House Referral:  NA  Discharge planning Services  CM Consult  Post Acute Care Choice:    Choice offered to:     DME Arranged:    DME Agency:     HH Arranged:    HH Agency:     Status of Service:  In process, will continue to follow  If discussed at Long Length of Stay Meetings, dates discussed:    Additional Comments:  Lawerance SabalDebbie Johnasia Liese, RN 04/27/2016, 2:25 PM

## 2016-04-27 NOTE — Consult Note (Signed)
ORTHOPAEDIC CONSULTATION  REQUESTING PHYSICIAN: Gust Rung, DO  Chief Complaint: Left heel ulcer chronic 3 years right foot ulcer chronic for 2 year.  HPI: Diane [name] is a 65 y.o. female who presents with  Left heel ulcer since 2015 and right heel ulcer since 2016. Patient states that she was at bed rest for all of 2016.   Past Medical History:  Diagnosis Date  . Anxiety   . Arthritis    "everywhere" (04/26/2016)  . Childhood asthma   . Chronic lower back pain   . Claustrophobia   . Complication of anesthesia    "had metallic taste in my mouth for a year after one of my ORs; next OR they changed some stuff; no problems since" (04/26/2016)  . Depression   . Diabetic polyneuropathy (HCC)   . Family history of adverse reaction to anesthesia    "mom; don't have an idea what it was" (04/26/2016)  . Fibromyalgia   . GERD (gastroesophageal reflux disease)   . Headache    "related to stress" (04/26/2016)  . Hypertension   . Manic, depressive (HCC)    "severe" (04/26/2016)  . Pneumonia    "long time ago" (04/26/2016)  . Sickle cell trait (HCC)   . Sleep apnea    "mask ordered; too claustrophobic to wear" (04/26/2016)  . Type II diabetes mellitus (HCC)    Past Surgical History:  Procedure Laterality Date  . ABDOMINAL HYSTERECTOMY     "partial"  . APPENDECTOMY    . CATARACT EXTRACTION W/ INTRAOCULAR LENS  IMPLANT, BILATERAL Bilateral   . FOOT SURGERY Left 2015  . FOOT SURGERY Right 04/2014   subtalar joint arthrodesis, talonavicualr joint arthrodesis, hammertoe repair 2,4,5 right foot  . FRACTURE SURGERY    . LAPAROSCOPIC CHOLECYSTECTOMY    . OPEN REDUCTION INTERNAL FIXATION (ORIF) TIBIA/FIBULA FRACTURE Left   . TOE AMPUTATION Bilateral    toe hallux   . TONSILLECTOMY AND ADENOIDECTOMY     Social History   Social History  . Marital status: Single    Spouse name: N/A  . Number of children: N/A  . Years of education: N/A   Social History Main Topics    . Smoking status: Never Smoker  . Smokeless tobacco: Never Used  . Alcohol use No  . Drug use: No  . Sexual activity: No   Other Topics Concern  . None   Social History Narrative  . None   History reviewed. No pertinent family history. - negative except otherwise stated in the family history section Allergies  Allergen Reactions  . Erythromycin Anaphylaxis  . Biaxin [Clarithromycin] Other (See Comments)  . Feldene [Piroxicam] Diarrhea, Nausea And Vomiting and Other (See Comments)    And sores in mouth  . Lasix [Furosemide] Other (See Comments)    Sores all over body  . Nsaids Other (See Comments)    Contraindicated because of her other meds  . Tetracyclines & Related   . Latex Rash  . Penicillins Rash and Other (See Comments)    Has patient had a PCN reaction causing immediate rash, facial/tongue/throat swelling, SOB or lightheadedness with hypotension: Yes Has patient had a PCN reaction causing severe rash involving mucus membranes or skin necrosis: No Has patient had a PCN reaction that required hospitalization No Has patient had a PCN reaction occurring within the last 10 years: No If all of the above answers are "NO", then may proceed with Cephalosporin use. And sores in her mouth   . Tape  Rash   Prior to Admission medications   Medication Sig Start Date End Date Taking? Authorizing Provider  ALPRAZolam Prudy Feeler(XANAX) 0.5 MG tablet Take 0.5 mg by mouth 3 (three) times daily.   Yes Historical Provider, MD  donepezil (ARICEPT) 5 MG tablet Take 5 mg by mouth at bedtime.   Yes Historical Provider, MD  ergocalciferol (VITAMIN D2) 50000 units capsule Take 50,000 Units by mouth once a week.   Yes Historical Provider, MD  gabapentin (NEURONTIN) 300 MG capsule Take 300 mg by mouth 3 (three) times daily.   Yes Historical Provider, MD  HYDROcodone-acetaminophen (NORCO) 10-325 MG tablet Take 2 tablets by mouth every 6 (six) hours as needed for moderate pain.   Yes Historical Provider, MD   insulin aspart (NOVOLOG) 100 UNIT/ML injection Inject 1 Units into the skin 3 (three) times daily before meals. Sliding scale; 12 units max per day   Yes Historical Provider, MD  insulin glargine (LANTUS) 100 UNIT/ML injection Inject 14 Units into the skin 2 (two) times daily. Per patients pharmacy records; this is twice daily. Can not confirm with patient   Yes Historical Provider, MD  losartan (COZAAR) 25 MG tablet Take 25 mg by mouth daily.   Yes Historical Provider, MD  RABEprazole (ACIPHEX) 20 MG tablet Take 20 mg by mouth 2 (two) times daily.   Yes Historical Provider, MD  sertraline (ZOLOFT) 100 MG tablet Take 100 mg by mouth daily.   Yes Historical Provider, MD   Dg Foot 2 Views Right  Result Date: 04/26/2016 CLINICAL DATA:  Right plantar foot infection. EXAM: RIGHT FOOT - 2 VIEW COMPARISON:  None. FINDINGS: Status post surgical amputation of left first toe. No acute fracture or dislocation is noted. Large ulceration is seen in plantar soft tissues. No lytic destruction is seen to suggest osteomyelitis. No radiopaque foreign body is noted. IMPRESSION: Large soft tissue ulceration seen in plantar region. No definite evidence of osteomyelitis. Electronically Signed   By: Lupita RaiderJames  Green Jr, M.D.   On: 04/26/2016 10:40   Dg Foot Complete Left  Result Date: 04/26/2016 CLINICAL DATA:  Initial evaluation for open wound. EXAM: LEFT FOOT - COMPLETE 3+ VIEW COMPARISON:  None available. FINDINGS: Extensive postsurgical changes present about the left foot, with partial amputation of the distal raise. Probable remotely healed fracture at the left second metatarsal. Postsurgical changes present within about the ankle, with intramedullary broad with interlocking screws present within the tibia. Osseous irregularity with deformity about the left midfoot. Pes planus foot deformity. Osteopenia. No acute fracture or dislocation. Soft tissue irregularity at the heel, suspicious for ulceration. No findings to  suggest underlying osteomyelitis. No soft tissue emphysema. Diffuse soft tissue swelling present about the foot. IMPRESSION: 1. Soft tissue ulceration at the posterior aspect of left foot/heel. No radiographic evidence for acute osteitis. 2. Diffuse soft tissue swelling about the foot/lower leg. No dissecting soft tissue emphysema. 3. Extensive postsurgical changes about the left foot with associated deformity as above. Electronically Signed   By: Rise MuBenjamin  McClintock M.D.   On: 04/26/2016 04:33   - pertinent xrays, CT, MRI studies were reviewed and independently interpreted  Positive ROS: All other systems have been reviewed and were otherwise negative with the exception of those mentioned in the HPI and as above.  Physical Exam: General: Alert, no acute distress Psychiatric: Patient is competent for consent with normal mood and affect Lymphatic: No axillary or cervical lymphadenopathy Cardiovascular: No pedal edema Respiratory: No cyanosis, no use of accessory musculature GI: No organomegaly,  abdomen is soft and non-tender  Skin: Patient has a ulcer proximal 3 cm in diameter on the left heel which tunnels to bone. SHe also has good granulation tissue no ascending cellulitis.  Right mid foot ulcers is 1 cm deep and 2 cm in diameter. This goes down to planar fascia and is chronic in nature no purulent drainage no cellulitis no ascending cellulitis   Neurologic: Patient does not have protective sensation bilateral lower extremities.   MUSCULOSKELETAL:  Patient is a strong dorsalis pedis pulse bilaterally. Chronic ulcers on both feet which are concerning for possible deep infection.patient is status post open reduction internal fixation for Charcot collapse of the left foot and also status post pantalar fusion. Of note patient states she has had a screw removed from the heel H would've been consistent with the subtalar fusion and this is the area of the chronic ulcer which is concerning for  chronic osteomyelitis.  Assessment: Assessment: Diabetic insensate neuropathy with chronic ulcers both feet, left heel and right midfoot, with ulcer which probes to bone on  the left with previous removal of deep retained hardware from the ulcerative site  Plan: Plan: Continue with wound care as per wound ostomy continence nursing. I have ordered an MRI scan to further evaluate both feet. I will also ordered a PRAFO to protect the left heel.   Thank you for the consult and the opportunity to see Diane [name]  Aldean BakerMarcus Duda, MD [facility] [phone] 7:53 AM

## 2016-04-27 NOTE — Progress Notes (Signed)
Inpatient Diabetes Program Recommendations  AACE/ADA: New Consensus Statement on Inpatient Glycemic Control (2015)  Target Ranges:  Prepandial:   less than 140 mg/dL      Peak postprandial:   less than 180 mg/dL (1-2 hours)      Critically ill patients:  140 - 180 mg/dL   Review of Glycemic Control  Diabetes history: DM 2 Outpatient Diabetes medications: Lantus 14 units BID, Novolog 1-12 units TID Current orders for Inpatient glycemic control: Novolog Sensitive TID + HS scale  Inpatient Diabetes Program Recommendations:   Diabetes Consult for DM Foot ulcer. Patient's A1c is 7.6% obtained 04/26/16, controlled at home on current medications. Glucose trends 150 and below since midnight when correction scale started. Will watch trends while inpatient.  Thanks,  Diane DeemShannon Daryl Quiros RN, MSN, [facility] Inpatient Diabetes Coordinator Team Pager [phone] (8a-5p)

## 2016-04-28 ENCOUNTER — Encounter (HOSPITAL_COMMUNITY): Payer: Self-pay | Admitting: Surgery

## 2016-04-28 ENCOUNTER — Inpatient Hospital Stay (HOSPITAL_COMMUNITY): Payer: Medicare Other | Admitting: Anesthesiology

## 2016-04-28 ENCOUNTER — Encounter (HOSPITAL_COMMUNITY): Admission: EM | Disposition: A | Discharge status: Home / Self Care | Payer: Self-pay | Source: Home / Self Care

## 2016-04-28 DIAGNOSIS — M86672 Other chronic osteomyelitis, left ankle and foot: Secondary | ICD-10-CM

## 2016-04-28 DIAGNOSIS — L02612 Cutaneous abscess of left foot: Secondary | ICD-10-CM

## 2016-04-28 DIAGNOSIS — F419 Anxiety disorder, unspecified: Secondary | ICD-10-CM

## 2016-04-28 DIAGNOSIS — E1169 Type 2 diabetes mellitus with other specified complication: Principal | ICD-10-CM

## 2016-04-28 HISTORY — PX: CALCANEAL OSTEOTOMY: SHX1281

## 2016-04-28 LAB — BASIC METABOLIC PANEL
Anion gap: 7 (ref 5–15)
BUN: 20 mg/dL (ref 6–20)
CALCIUM: 9.2 mg/dL (ref 8.9–10.3)
CO2: 28 mmol/L (ref 22–32)
CREATININE: 1.19 mg/dL — AB (ref 0.44–1.00)
Chloride: 106 mmol/L (ref 101–111)
GFR calc Af Amer: 55 mL/min — ABNORMAL LOW (ref >60)
GFR calc non Af Amer: 47 mL/min — ABNORMAL LOW (ref >60)
GLUCOSE: 120 mg/dL — AB (ref 65–99)
Potassium: 3.9 mmol/L (ref 3.5–5.1)
Sodium: 141 mmol/L (ref 135–145)

## 2016-04-28 LAB — CBC
HCT: 30.3 % — ABNORMAL LOW (ref 36.0–46.0)
Hemoglobin: 9.5 g/dL — ABNORMAL LOW (ref 12.0–15.0)
MCH: 24.4 pg — AB (ref 26.0–34.0)
MCHC: 31.4 g/dL (ref 30.0–36.0)
MCV: 77.7 fL — AB (ref 78.0–100.0)
PLATELETS: 404 10*3/uL — AB (ref 150–400)
RBC: 3.9 MIL/uL (ref 3.87–5.11)
RDW: 16 % — ABNORMAL HIGH (ref 11.5–15.5)
WBC: 10.1 10*3/uL (ref 4.0–10.5)

## 2016-04-28 LAB — GLUCOSE, CAPILLARY
GLUCOSE-CAPILLARY: 128 mg/dL — AB (ref 65–99)
GLUCOSE-CAPILLARY: 110 mg/dL — AB (ref 65–99)
GLUCOSE-CAPILLARY: 93 mg/dL (ref 65–99)
GLUCOSE-CAPILLARY: 85 mg/dL (ref 65–99)
Glucose-Capillary: 81 mg/dL (ref 65–99)
Glucose-Capillary: 74 mg/dL (ref 65–99)
Glucose-Capillary: 98 mg/dL (ref 65–99)

## 2016-04-28 LAB — SURGICAL PCR SCREEN
MRSA, PCR: POSITIVE — AB
Staphylococcus aureus: POSITIVE — AB

## 2016-04-28 SURGERY — OSTEOTOMY, CALCANEUS
Anesthesia: General | Site: Foot | Laterality: Left

## 2016-04-28 MED ORDER — METOCLOPRAMIDE HCL 5 MG/ML IJ SOLN: 5.0000 mg | Freq: Three times a day (TID) | INTRAMUSCULAR | Status: DC | PRN

## 2016-04-28 MED ORDER — POVIDONE-IODINE 10 % EX SWAB
2.0000 "application " | Freq: Once | CUTANEOUS | Status: AC
  Administered 2016-04-28: 2 via TOPICAL

## 2016-04-28 MED ORDER — ONDANSETRON HCL 4 MG/2ML IJ SOLN
INTRAMUSCULAR | Status: DC | PRN
  Administered 2016-04-28: 4 mg via INTRAVENOUS

## 2016-04-28 MED ORDER — METHOCARBAMOL 1000 MG/10ML IJ SOLN
500.0000 mg | Freq: Four times a day (QID) | INTRAMUSCULAR | Status: DC | PRN
  Filled 2016-04-28: qty 5

## 2016-04-28 MED ORDER — HYDROMORPHONE HCL 1 MG/ML IJ SOLN
1.0000 mg | INTRAMUSCULAR | Status: DC | PRN
  Administered 2016-04-28: 0.5 mg via INTRAVENOUS

## 2016-04-28 MED ORDER — LIDOCAINE HCL (CARDIAC) 20 MG/ML IV SOLN
INTRAVENOUS | Status: DC | PRN
  Administered 2016-04-28: 60 mg via INTRAVENOUS

## 2016-04-28 MED ORDER — CEFAZOLIN IN D5W 1 GM/50ML IV SOLN
1.0000 g | Freq: Four times a day (QID) | INTRAVENOUS | Status: AC
  Administered 2016-04-29 (×3): 1 g via INTRAVENOUS
  Filled 2016-04-28 (×5): qty 50

## 2016-04-28 MED ORDER — ONDANSETRON HCL 4 MG/2ML IJ SOLN
4.0000 mg | Freq: Four times a day (QID) | INTRAMUSCULAR | Status: DC | PRN
Start: 2016-04-28 — End: 2016-04-29

## 2016-04-28 MED ORDER — HYDROMORPHONE HCL 1 MG/ML IJ SOLN
INTRAMUSCULAR | Status: AC
  Filled 2016-04-28: qty 0.5

## 2016-04-28 MED ORDER — OXYCODONE HCL 5 MG/5ML PO SOLN: 5.0000 mg | Freq: Once | ORAL | Status: DC | PRN

## 2016-04-28 MED ORDER — LORAZEPAM 2 MG/ML IJ SOLN
0.5000 mg | Freq: Once | INTRAMUSCULAR | Status: AC
  Administered 2016-04-28: 0.5 mg via INTRAVENOUS
  Filled 2016-04-28: qty 1

## 2016-04-28 MED ORDER — FENTANYL CITRATE (PF) 100 MCG/2ML IJ SOLN
INTRAMUSCULAR | Status: DC | PRN
Start: 2016-04-28 — End: 2016-04-28
  Administered 2016-04-28 (×2): 50 ug via INTRAVENOUS
  Administered 2016-04-28: 100 ug via INTRAVENOUS

## 2016-04-28 MED ORDER — FENTANYL CITRATE (PF) 100 MCG/2ML IJ SOLN
INTRAMUSCULAR | Status: AC
  Filled 2016-04-28: qty 2

## 2016-04-28 MED ORDER — ACETAMINOPHEN 650 MG RE SUPP: 650.0000 mg | Freq: Four times a day (QID) | RECTAL | Status: DC | PRN

## 2016-04-28 MED ORDER — LACTATED RINGERS IV SOLN
INTRAVENOUS | Status: DC | PRN
  Administered 2016-04-28: 18:00:00 via INTRAVENOUS

## 2016-04-28 MED ORDER — FENTANYL CITRATE (PF) 100 MCG/2ML IJ SOLN: 25.0000 ug | INTRAMUSCULAR | Status: DC | PRN

## 2016-04-28 MED ORDER — 0.9 % SODIUM CHLORIDE (POUR BTL) OPTIME
TOPICAL | Status: DC | PRN
  Administered 2016-04-28: 1000 mL

## 2016-04-28 MED ORDER — METHOCARBAMOL 500 MG PO TABS: 500.0000 mg | ORAL_TABLET | Freq: Four times a day (QID) | ORAL | Status: DC | PRN

## 2016-04-28 MED ORDER — CHLORHEXIDINE GLUCONATE 4 % EX LIQD
60.0000 mL | Freq: Once | CUTANEOUS | Status: AC
  Administered 2016-04-28: 4 via TOPICAL
  Filled 2016-04-28: qty 60

## 2016-04-28 MED ORDER — SODIUM CHLORIDE 0.9 % IV SOLN
INTRAVENOUS | Status: DC
  Administered 2016-04-28: 05:00:00 via INTRAVENOUS

## 2016-04-28 MED ORDER — PROPOFOL 10 MG/ML IV BOLUS
INTRAVENOUS | Status: AC
  Filled 2016-04-28: qty 20

## 2016-04-28 MED ORDER — MIDAZOLAM HCL 5 MG/5ML IJ SOLN
INTRAMUSCULAR | Status: DC | PRN
  Administered 2016-04-28: 2 mg via INTRAVENOUS

## 2016-04-28 MED ORDER — OXYCODONE HCL 5 MG PO TABS
5.0000 mg | ORAL_TABLET | ORAL | Status: DC | PRN
  Administered 2016-04-29: 5 mg via ORAL
  Administered 2016-04-29 (×2): 10 mg via ORAL
  Filled 2016-04-28 (×2): qty 2
  Filled 2016-04-28: qty 1

## 2016-04-28 MED ORDER — MIDAZOLAM HCL 2 MG/2ML IJ SOLN
INTRAMUSCULAR | Status: AC
  Filled 2016-04-28: qty 2

## 2016-04-28 MED ORDER — ONDANSETRON HCL 4 MG PO TABS: 4.0000 mg | ORAL_TABLET | Freq: Four times a day (QID) | ORAL | Status: DC | PRN

## 2016-04-28 MED ORDER — HYDROMORPHONE HCL 2 MG/ML IJ SOLN
1.0000 mg | INTRAMUSCULAR | Status: DC | PRN
  Administered 2016-04-28 – 2016-04-29 (×3): 1 mg via INTRAVENOUS
  Filled 2016-04-28 (×3): qty 1

## 2016-04-28 MED ORDER — OXYCODONE HCL 5 MG PO TABS: 5.0000 mg | ORAL_TABLET | Freq: Once | ORAL | Status: DC | PRN

## 2016-04-28 MED ORDER — PHENYLEPHRINE 40 MCG/ML (10ML) SYRINGE FOR IV PUSH (FOR BLOOD PRESSURE SUPPORT)
PREFILLED_SYRINGE | INTRAVENOUS | Status: AC
  Filled 2016-04-28: qty 10

## 2016-04-28 MED ORDER — CEFAZOLIN SODIUM 1 G IJ SOLR
INTRAMUSCULAR | Status: DC | PRN
Start: 2016-04-28 — End: 2016-04-28
  Administered 2016-04-28: 2 g via INTRAMUSCULAR

## 2016-04-28 MED ORDER — PROPOFOL 10 MG/ML IV BOLUS
INTRAVENOUS | Status: DC | PRN
  Administered 2016-04-28: 150 mg via INTRAVENOUS

## 2016-04-28 MED ORDER — METOCLOPRAMIDE HCL 10 MG PO TABS: 5.0000 mg | ORAL_TABLET | Freq: Three times a day (TID) | ORAL | Status: DC | PRN

## 2016-04-28 MED ORDER — ACETAMINOPHEN 325 MG PO TABS: 650.0000 mg | ORAL_TABLET | Freq: Four times a day (QID) | ORAL | Status: DC | PRN

## 2016-04-28 MED ORDER — LOSARTAN POTASSIUM 50 MG PO TABS
25.0000 mg | ORAL_TABLET | Freq: Every day | ORAL | Status: DC
  Administered 2016-04-29: 25 mg via ORAL
  Filled 2016-04-28: qty 1

## 2016-04-28 SURGICAL SUPPLY — 45 items
BANDAGE ACE 4X5 VEL STRL LF (GAUZE/BANDAGES/DRESSINGS) IMPLANT
BANDAGE ACE 6X5 VEL STRL LF (GAUZE/BANDAGES/DRESSINGS) IMPLANT
BANDAGE ESMARK 6X9 LF (GAUZE/BANDAGES/DRESSINGS) IMPLANT
BLADE SAW SGTL 18.5X63.X.64 HD (BLADE) ×3 IMPLANT
BNDG COHESIVE 6X5 TAN STRL LF (GAUZE/BANDAGES/DRESSINGS) ×3 IMPLANT
BNDG ESMARK 6X9 LF (GAUZE/BANDAGES/DRESSINGS)
BNDG GAUZE ELAST 4 BULKY (GAUZE/BANDAGES/DRESSINGS) ×3 IMPLANT
BNDG GAUZE STRTCH 6 (GAUZE/BANDAGES/DRESSINGS) ×9 IMPLANT
COTTON STERILE ROLL (GAUZE/BANDAGES/DRESSINGS) ×3 IMPLANT
COVER SURGICAL LIGHT HANDLE (MISCELLANEOUS) ×6 IMPLANT
CUFF TOURNIQUET SINGLE 34IN LL (TOURNIQUET CUFF) IMPLANT
CUFF TOURNIQUET SINGLE 44IN (TOURNIQUET CUFF) IMPLANT
DRAPE INCISE IOBAN 66X45 STRL (DRAPES) ×3 IMPLANT
DRAPE OEC MINIVIEW 54X84 (DRAPES) ×3 IMPLANT
DRAPE PROXIMA HALF (DRAPES) ×3 IMPLANT
DRAPE U-SHAPE 47X51 STRL (DRAPES) ×3 IMPLANT
DRSG ADAPTIC 3X8 NADH LF (GAUZE/BANDAGES/DRESSINGS) ×3 IMPLANT
DURAPREP 26ML APPLICATOR (WOUND CARE) ×3 IMPLANT
ELECT REM PT RETURN 9FT ADLT (ELECTROSURGICAL) ×3
ELECTRODE REM PT RTRN 9FT ADLT (ELECTROSURGICAL) ×1 IMPLANT
GAUZE SPONGE 4X4 12PLY STRL (GAUZE/BANDAGES/DRESSINGS) ×3 IMPLANT
GLOVE BIOGEL PI IND STRL 9 (GLOVE) ×1 IMPLANT
GLOVE BIOGEL PI INDICATOR 9 (GLOVE) ×2
GLOVE SURG ORTHO 9.0 STRL STRW (GLOVE) ×3 IMPLANT
GOWN STRL REUS W/ TWL XL LVL3 (GOWN DISPOSABLE) ×3 IMPLANT
GOWN STRL REUS W/TWL XL LVL3 (GOWN DISPOSABLE) ×6
KIT BASIN OR (CUSTOM PROCEDURE TRAY) ×3 IMPLANT
KIT ROOM TURNOVER OR (KITS) ×3 IMPLANT
MANIFOLD NEPTUNE II (INSTRUMENTS) ×3 IMPLANT
NS IRRIG 1000ML POUR BTL (IV SOLUTION) ×3 IMPLANT
PACK ORTHO EXTREMITY (CUSTOM PROCEDURE TRAY) ×3 IMPLANT
PAD ABD 8X10 STRL (GAUZE/BANDAGES/DRESSINGS) ×3 IMPLANT
PAD ARMBOARD 7.5X6 YLW CONV (MISCELLANEOUS) ×6 IMPLANT
PADDING CAST COTTON 6X4 STRL (CAST SUPPLIES) ×3 IMPLANT
SPONGE LAP 18X18 X RAY DECT (DISPOSABLE) ×3 IMPLANT
STAPLER VISISTAT 35W (STAPLE) IMPLANT
SUCTION FRAZIER HANDLE 10FR (MISCELLANEOUS) ×2
SUCTION TUBE FRAZIER 10FR DISP (MISCELLANEOUS) ×1 IMPLANT
SUT ETHILON 2 0 PSLX (SUTURE) ×6 IMPLANT
SUT VIC AB 2-0 CTB1 (SUTURE) ×6 IMPLANT
TOWEL OR 17X24 6PK STRL BLUE (TOWEL DISPOSABLE) ×3 IMPLANT
TOWEL OR 17X26 10 PK STRL BLUE (TOWEL DISPOSABLE) ×3 IMPLANT
TUBE CONNECTING 12'X1/4 (SUCTIONS) ×1
TUBE CONNECTING 12X1/4 (SUCTIONS) ×2 IMPLANT
WATER STERILE IRR 1000ML POUR (IV SOLUTION) ×3 IMPLANT

## 2016-04-28 NOTE — Transfer of Care (Signed)
Immediate Anesthesia Transfer of Care Note  Patient: Diane [name]  Procedure(s) Performed: Procedure(s): CALCANEAL OSTEOTOMY (Left)  Patient Location: PACU  Anesthesia Type:General  Level of Consciousness: awake, alert  and oriented  Airway & Oxygen Therapy: Patient Spontanous Breathing and Patient connected to nasal cannula oxygen  Post-op Assessment: Report given to RN, Post -op Vital signs reviewed and stable and Patient moving all extremities  Post vital signs: Reviewed and stable  Last Vitals:  Vitals:   04/28/16 0533 04/28/16 1412  BP: (!) 160/84 (!) 153/67  Pulse: 78 88  Resp: 17 20  Temp: 37.2 C 36.9 C    Last Pain:  Vitals:   04/28/16 1412  TempSrc: Oral  PainSc:       Patients Stated Pain Goal: 3 (04/28/16 0229)  Complications: No apparent anesthesia complications

## 2016-04-28 NOTE — Anesthesia Postprocedure Evaluation (Signed)
Anesthesia Post Note  Patient: Diane [name]  Procedure(s) Performed: Procedure(s) (LRB): CALCANEAL OSTEOTOMY (Left)  Patient location during evaluation: PACU Anesthesia Type: General Level of consciousness: awake and alert Pain management: pain level controlled Vital Signs Assessment: post-procedure vital signs reviewed and stable Respiratory status: spontaneous breathing, nonlabored ventilation, respiratory function stable and patient connected to nasal cannula oxygen Cardiovascular status: blood pressure returned to baseline and stable Postop Assessment: no signs of nausea or vomiting Anesthetic complications: no       Last Vitals:  Vitals:   04/28/16 1930 04/28/16 2002  BP:  137/61  Pulse:  87  Resp:  17  Temp: 36.7 C 36.9 C    Last Pain:  Vitals:   04/28/16 2002  TempSrc: Oral  PainSc:                  Shelton SilvasKevin D Claryce Friel

## 2016-04-28 NOTE — Progress Notes (Signed)
Patient ID: Diane MillinLinda K Hitt, female   DOB: [birth year], 65 y.o.   MRN: [id number] Patient status post partial calcaneal excision on the left. The soft tissue margins were healthy viable there is no evidence of infection of the calcaneus at the level the resection. Patient may discharge to home once she is safe with ambulation nonweightbearing on the left. I will follow-up in the office in 2 weeks.

## 2016-04-28 NOTE — Progress Notes (Signed)
   Subjective: No acute events overnight. Patient informed she will need surgery on her Left foot for chronic osteomyelitis. She is very anxious for this and became tearful in the room. She said her anxiety is really high. She has not had her home anxiety medications that she says she normally takes. She is currently NPO for surgery this afternoon. I will give her a small dose of an IV anxiolytic. She is otherwise doing well.  Objective:  Vital signs in last 24 hours: Vitals:   04/27/16 1401 04/27/16 2225 04/28/16 0159 04/28/16 0533  BP: (!) 142/77 (!) 145/68  (!) 160/84  Pulse: 84 84  78  Resp: 18 17  17   Temp: 97.8 F (36.6 C) 99.1 F (37.3 C) 97.5 F (36.4 C) 99 F (37.2 C)  TempSrc:  Oral Oral Oral  SpO2: 100% 98%  97%  Weight:      Height:       Physical Exam  Constitutional: She is oriented to person, place, and time. She appears well-developed.  Obese, very anxious, became tearful this morning  HENT:  Head: Normocephalic and atraumatic.  Poor oral dentition  Cardiovascular: Normal rate and regular rhythm.  Exam reveals no gallop and no friction rub.   No murmur heard. Respiratory: Effort normal and breath sounds normal. No respiratory distress. She has no wheezes.  GI: Soft. Bowel sounds are normal. She exhibits no distension. There is no tenderness.  Musculoskeletal:  Left leg in boot. Right leg with wound with pictures in the medical record.   Neurological: She is alert and oriented to person, place, and time.     Assessment/Plan:  Active Problems:   Diabetic foot ulcer (HCC)   Hypertension   DM (diabetes mellitus) type II controlled with renal manifestation (HCC)   Fibromyalgia   Diabetic polyneuropathy (HCC)   Osteomyelitis of ankle or foot (HCC)   Diabetic ulcer of left heel associated with type 2 diabetes mellitus, with bone involvement without evidence of necrosis (HCC)  65 yo f with DM II and significant polyneuropathy here with bilateral feet infection,  R foot is invading to deeper muscles.  # Bilateral diabetic foot infection with Left osteomyelitis  -- MRI shows deep infection and changes c/w chronic osteomyelitis of the left calcaneous. Right foot has no direct communication or signs of osteo. Will proceed with partial calcaneal excision on the left today with orthopedic surgery. Will be able to d/c to home s/p procedure and when ambulating well.  -- Follow-up orthopedic surgery recommendations -- NPO for surgery this afternoon   # DM II with diabetic polyneuropathy  - will check hgba1c- 7.6 - put on SSI. Unclear what her home regimen is.  # HTN Became hypertensive. Cr improved. Will restart home losartan.  -- Start home losartan  # CKD vs AKI on CKD Cr improved. Most recent Cr of 1.19. Suspect AKI 2/2 prerenal etiologies most likely dehydration.  -- continue to monitor   # Anxiety  Patient says she normally takes xanax .25mg  TID. She was extremely anxious for surgery this afternoon. She is currently NPO. -- IV ativan .5mg  x 1 for anxiety this am   DVT/PE prophylaxis: Heparin FEN/GI: Carbohydrate modified diet  Dispo: Anticipated discharge pending clinical course and potential need for surgery.  Thomasene LotJames Erez Mccallum, MD 04/28/2016, 7:56 AM Pager: [phone]

## 2016-04-28 NOTE — Discharge Summary (Signed)
Name: Diane [name] MRN: [id number] DOB: 08-Oct-1950 65 y.o. PCP: No Pcp Per Patient  Date of Admission: 04/26/2016  1:12 AM Date of Discharge: 04/29/2016 Attending Physician: Gust RungErik C Hoffman, DO  Discharge Diagnosis: 1. 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, with bone involvement without evidence of necrosis [facility](HCC)   Discharge Medications: Allergies as of 04/29/2016      Reactions   Erythromycin Anaphylaxis   Biaxin [clarithromycin] Other (See Comments)   Feldene [piroxicam] Diarrhea, Nausea And Vomiting, Other (See Comments)   And sores in mouth   Lasix [furosemide] Other (See Comments)   Sores all over body   Nsaids Other (See Comments)   Contraindicated because of her other meds   Tetracyclines & Related    Latex Rash   Penicillins Rash, Other (See Comments)   Has patient had a PCN reaction causing immediate rash, facial/tongue/throat swelling, SOB or lightheadedness with hypotension: Yes Has patient had a PCN reaction causing severe rash involving mucus membranes or skin necrosis: No Has patient had a PCN reaction that required hospitalization No Has patient had a PCN reaction occurring within the last 10 years: No If all of the above answers are "NO", then may proceed with Cephalosporin use. And sores in her mouth   Tape Rash      Medication List    STOP taking these medications   insulin glargine 100 UNIT/ML injection Commonly known as:  LANTUS     TAKE these medications   ALPRAZolam 0.5 MG tablet Commonly known as:  XANAX Take 0.5 mg by mouth 3 (three) times daily.   donepezil 5 MG tablet Commonly known as:  ARICEPT Take 5 mg  by mouth at bedtime.   ergocalciferol 50000 units capsule Commonly known as:  VITAMIN D2 Take 50,000 Units by mouth once a week.   gabapentin 300 MG capsule Commonly known as:  NEURONTIN Take 300 mg by mouth 3 (three) times daily.   HYDROcodone-acetaminophen 10-325 MG tablet Commonly known as:  NORCO Take 2 tablets by mouth every 6 (six) hours as needed for moderate pain.   insulin aspart 100 UNIT/ML injection Commonly known as:  novoLOG Inject 1 Units into the skin 3 (three) times daily before meals. Sliding scale; 12 units max per day   losartan 25 MG tablet Commonly known as:  COZAAR Take 25 mg by mouth daily.   oxyCODONE 5 MG immediate release tablet Commonly known as:  Oxy IR/ROXICODONE Take 1 tablet (5 mg total) by mouth every 4 (four) hours as needed for breakthrough pain.   RABEprazole 20 MG tablet Commonly known as:  ACIPHEX Take 20 mg by mouth 2 (two) times daily.   sertraline 100 MG tablet Commonly known as:  ZOLOFT Take 100 mg by mouth daily.       Disposition and follow-up:   Ms.Diane [name] [facility] in Good condition.  At the hospital follow up visit please address:  1.  Please address her diabetes. Her exact home regimen was difficult to determine. Her sugars maintained near euglycemia in the hospital and she will only be discharged on sliding scale insulin.  2.  Labs / imaging needed at time of  follow-up: None  3.  Pending labs/ test needing follow-up: None  Follow-up Appointments: Follow-up Information    Nadara Mustard, MD In 2 weeks.   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 She recently moved here from [place] and noticed that one of these wounds opened up while she was moving. This prompted her to come to the emergency department. At the time of admission she denied any fever, nausea, vomiting, diarrhea, chills or other complaints. She was admitted and orthopedic surgery was consulted for further evaluation and management of her lower extremity foot infections. Left foot radiograph was performed which showed extensive soft tissue ulceration at the posterior aspect of the left foot/heel. There is no radiographic evidence of acute osteitis. Right foot radiograph demonstrated a large soft tissue ulceration seen in the plantar region. No evidence of osteomyelitis. These imaging studies were followed up with MRIs of both the right foot and left foot. Left foot MRI was positive for marrow edema suggesting osteomyelitis. Right foot MRI did not show evidence of osteomyelitis. Orthopedic surgery reviewed the films and the patient was taken to the operating room on 04/28/2016 for left calcaneal debridement of osteomyelitis. She will proceed with advanced wound care for her right foot and left foot following the surgical procedure. Status post procedure the patient was afebrile and hemodynamically stable. Her pain was well-controlled. She was having good by mouth intake. She states that she was ready to go home and had good support at home to help her recover. She was medically appropriate for discharge.  2. Diabetes mellitus The patient has a history of diabetes with a complication of bilateral lower extremity feet infection. Her exact diabetic  regimen is unknown. During her hospital stay she maintained very close to a euglycemic state. We will discharge her on sliding scale insulin only. She would benefit from further titration of her diabetes regimen in the outpatient setting. Please discuss with her more thoroughly what she was taking at home and make adjustments as needed.  Discharge Vitals:   BP 132/63 (BP Location: Left Arm)   Pulse 85   Temp 98.6 F (37 C) (Oral)   Resp 18   Ht 5\' 1"  (1.549 m)   Wt 202 lb 3.2 oz (91.7 kg)   SpO2 95%   BMI 38.21 kg/m   Pertinent Labs, Studies, and Procedures:  1. Right foot radiograph-no evidence of osteomyelitis 2. Left foot radiograph-no evidence of osteomyelitis 3. Right foot MRI-no evidence of osteomyelitis 4. Left foot MRI-marrow edema consistent with chronic and subacute osteomyelitis prominent in the calcaneal region ** For additional radiographic details please see the official imaging reports in Epic 5. Status post left calcaneal incision and debridement  Discharge Instructions: Discharge Instructions    Diet - low sodium heart healthy    Complete by:  As directed    Discharge instructions    Complete  by:  As directed    Please ensure you follow up with your orthopedic surgeon at your scheduled appointment.  Please ensure you make appointment with a primary care doctor through [facility].  Please continue to take all of your medications as prescribed.   Elevate operative extremity    Complete by:  As directed    Increase activity slowly    Complete by:  As directed    Non weight bearing    Complete by:  As directed    Laterality:  left   Extremity:  Lower      Signed: Thomasene LotJames Malvika Tung, MD 04/29/2016, 12:12 PM   Pager: [phone]

## 2016-04-28 NOTE — Anesthesia Procedure Notes (Signed)
Procedure Name: LMA Insertion Date/Time: 04/28/2016 6:04 PM Performed by: Orvilla FusATO, Myrka Sylva A Pre-anesthesia Checklist: Patient identified, Emergency Drugs available, Suction available and Patient being monitored Patient Re-evaluated:Patient Re-evaluated prior to inductionOxygen Delivery Method: Circle System Utilized Preoxygenation: Pre-oxygenation with 100% oxygen Intubation Type: IV induction Ventilation: Mask ventilation without difficulty LMA: LMA with gastric port inserted LMA Size: 4.0 Number of attempts: 1 Airway Equipment and Method: Bite block Placement Confirmation: positive ETCO2 Tube secured with: Tape Dental Injury: Teeth and Oropharynx as per pre-operative assessment

## 2016-04-28 NOTE — Op Note (Signed)
04/26/2016 - 04/28/2016  6:33 PM  PATIENT:  Diane [name]    PRE-OPERATIVE DIAGNOSIS:  Left Calcaneal Osteomyelitis  POST-OPERATIVE DIAGNOSIS:  Same  PROCEDURE:  CALCANEAL OSTEOTOMY  SURGEON:  Nadara MustardMarcus V Duda, MD  PHYSICIAN ASSISTANT:None ANESTHESIA:   General  PREOPERATIVE INDICATIONS:  Diane [name] is a  65 y.o. female with a diagnosis of Left Calcaneal Osteomyelitis who failed conservative measures and elected for surgical management.    The risks benefits and alternatives were discussed with the patient preoperatively including but not limited to the risks of infection, bleeding, nerve injury, cardiopulmonary complications, the need for revision surgery, among others, and the patient was willing to proceed.  OPERATIVE IMPLANTS: None  OPERATIVE FINDINGS: Abscess involving left calcaneus  OPERATIVE PROCEDURE: Patient brought the operating room and underwent a general anesthetic. After adequate levels anesthesia obtained patient's left lower extremity was prepped using DuraPrep draped into a sterile field. A longitudinal incision was made to ellipse out the ulcer. A soft tissue defect was created that was 9 x 4 cm. This was carried sharply down to bone the distal 2 cm of the calcaneus was excised. There was no visible infection at the level of the ostectomy. The calcaneus was removed in 1 block of tissue. Electrocautery was used for hemostasis. The wound was irrigated with normal saline. Local tissue rearrangement was used for wound closure. A sterile compressive dressing was applied. Patient was extubated taken the PACU in stable condition.

## 2016-04-28 NOTE — Anesthesia Preprocedure Evaluation (Signed)
Anesthesia Evaluation  Patient identified by MRN, date of birth, ID band Patient awake    Reviewed: Allergy & Precautions, NPO status , Patient's Chart, lab work & pertinent test results  History of Anesthesia Complications Negative for: history of anesthetic complications  Airway Mallampati: II  TM Distance: >3 FB Neck ROM: Full    Dental  (+) Missing   Pulmonary sleep apnea ,    breath sounds clear to auscultation       Cardiovascular hypertension, Pt. on medications (-) angina(-) Past MI and (-) CHF  Rhythm:Regular     Neuro/Psych  Headaches, PSYCHIATRIC DISORDERS Anxiety Depression Bipolar Disorder  Neuromuscular disease    GI/Hepatic Neg liver ROS, GERD  Controlled,  Endo/Other  diabetes, Type 2, Insulin Dependent  Renal/GU Renal InsufficiencyRenal disease     Musculoskeletal  (+) Arthritis , Fibromyalgia -  Abdominal   Peds  Hematology   Anesthesia Other Findings   Reproductive/Obstetrics                             Anesthesia Physical Anesthesia Plan  ASA: III  Anesthesia Plan: General   Post-op Pain Management:    Induction: Intravenous  Airway Management Planned: LMA  Additional Equipment: None  Intra-op Plan:   Post-operative Plan: Extubation in OR  Informed Consent: I have reviewed the patients History and Physical, chart, labs and discussed the procedure including the risks, benefits and alternatives for the proposed anesthesia with the patient or authorized representative who has indicated his/her understanding and acceptance.   Dental advisory given  Plan Discussed with: CRNA and Surgeon  Anesthesia Plan Comments:         Anesthesia Quick Evaluation

## 2016-04-28 NOTE — Progress Notes (Signed)
Patient ID: Diane MillinLinda K Sinyard, female   DOB: 1950-10-04, 65 y.o.   MRN: [id number] Patient has persistent left heel ulcer and right foot ulcer. I have reviewed the MRI scan which shows no deep abscess on the right foot no direct communicating bony infection on the right foot. Left foot does show a deep abscess with communication with the calcaneus most likely chronic osteomyelitis of the calcaneus. I recommend the patient proceed with wound care for the right foot and we will need to proceed with a partial calcaneal excision on the left. Discussed risks of surgery with risk of the wound not healing need for additional surgery also the potential the patient would need a transtibial amputation  We will plan for partial calcaneal excision as an add-on surgery today. Patient may be discharged to home when she is safe with ambulation.

## 2016-04-29 DIAGNOSIS — Z91048 Other nonmedicinal substance allergy status: Secondary | ICD-10-CM

## 2016-04-29 DIAGNOSIS — Z91040 Latex allergy status: Secondary | ICD-10-CM

## 2016-04-29 LAB — GLUCOSE, CAPILLARY
Glucose-Capillary: 119 mg/dL — ABNORMAL HIGH (ref 65–99)
Glucose-Capillary: 149 mg/dL — ABNORMAL HIGH (ref 65–99)

## 2016-04-29 MED ORDER — OXYCODONE HCL 5 MG PO TABS: 5.0000 mg | ORAL_TABLET | ORAL | 0 refills | Status: DC | PRN

## 2016-04-29 NOTE — Care Management Note (Signed)
Case Management Note  Patient Details  Name: Diane MillinLinda K [name] MRN: [id number] Date of Birth: 24-Dec-1950  Subjective/Objective:                 Pt admitted  with a diagnosis of Left Calcaneal Osteomyelitis who failed conservative measures and now s/p calcaneal ostomy 04/28/2016   Action/Plan: Plan is to d/c to home today with hospital follow up with Dr.Duda in 2 weeks.  Expected Discharge Date:  04/29/2016           Expected Discharge Plan:  Home with self care  In-House Referral:  NA  Discharge planning Services  CM Consult  Post Acute Care Choice:    Choice offered to:  Patient  DME Arranged:  Dan HumphreysWalker rolling DME Agency:  [facility]  [facility]    [facility]:     Status of Service:  Completed, signed off  If discussed at Long Length of Stay Meetings, dates discussed:    Additional Comments:  Epifanio LeschesCole, Dare Sanger Hudson, RN 04/29/2016, 3:37 PM

## 2016-04-29 NOTE — Evaluation (Signed)
Physical Therapy Evaluation Patient Details Name: Diane [name] MRN: [id number] DOB: 1951/05/07 Today's Date: 04/29/2016   History of Present Illness  65 y.o. female with a diagnosis of Left Calcaneal Osteomyelitis who failed conservative measures and elected for surgical management, now s/p calcaneal ostomy.  Clinical Impression  Patient seen for mobility assessment and education in preparation for discharge home. Patient able to perform transfers and transitional movements well. Tolerated some step to ambulation NWBing LLE with RW. Educated regarding restrictions, use of PRAFO and safety with mobility. Patient will have adequate assist at discharge, will defer all further needs to HHPT at this time.    Follow Up Recommendations Home health PT;Supervision/Assistance - 24 hour    Equipment Recommendations  Rolling walker with 5" wheels    Recommendations for Other Services       Precautions / Restrictions Precautions Precautions: Fall Restrictions Weight Bearing Restrictions: Yes LLE Weight Bearing: Non weight bearing      Mobility  Bed Mobility Overal bed mobility: Needs Assistance Bed Mobility: Supine to Sit;Sit to Supine     Supine to sit: Modified independent (Device/Increase time) Sit to supine: Modified independent (Device/Increase time)   General bed mobility comments: increased time and effort to perform, no physical assist required at this time  Transfers Overall transfer level: Needs assistance Equipment used: Rolling walker (2 wheeled) Transfers: Sit to/from [facility] Sit to Stand: Min guard Stand pivot transfers: Supervision       General transfer comment: min guard to stand using RW, supervision for transfer to [facility]  Ambulation/Gait Ambulation/Gait assistance: Min guard Ambulation Distance (Feet): 10 Feet Assistive device: Rolling walker (2 wheeled) Gait Pattern/deviations: Step-to pattern     General Gait Details: non weight  bearing LLE, educated on technique and sequencing for NWBing with hop to method ambulation forward and backward using RW  Stairs            Wheelchair Mobility    Modified Rankin (Stroke Patients Only)       Balance Overall balance assessment: Needs assistance Sitting-balance support: Bilateral upper extremity supported Sitting balance-Leahy Scale: Good     Standing balance support: During functional activity;Bilateral upper extremity supported Standing balance-Leahy Scale: Poor Standing balance comment: reliance on UE support due to [place]                             Pertinent Vitals/Pain Pain Assessment: 0-10 Pain Score: 3  Pain Location: left foot Pain Descriptors / Indicators: Grimacing;Guarding;Sore Pain Intervention(s): Monitored during session;Repositioned    Home Living Family/patient expects to be discharged to:: Private residence Living Arrangements: Non-relatives/Friends Available Help at Discharge: Friend(s);Family;Available 24 hours/day Type of Home: House Home Access: Stairs to enter Entrance Stairs-Rails: None Entrance Stairs-Number of Steps: 2 Home Layout: Able to live on main level with bedroom/bathroom Home Equipment: Walker - 4 wheels;Bedside commode;Tub bench;Wheelchair - manual      Prior Function Level of Independence: Independent with assistive device(s)         Comments: uses a rollator at times     Hand Dominance   Dominant Hand: Right    Extremity/Trunk Assessment   Upper Extremity Assessment Upper Extremity Assessment: Overall WFL for tasks assessed    Lower Extremity Assessment Lower Extremity Assessment: Generalized weakness;LLE deficits/detail LLE: Unable to fully assess due to pain;Unable to fully assess due to immobilization (distal extremity in PRAFO)    Cervical / Trunk Assessment Cervical / Trunk Assessment:  (increased body  habitus)  Communication   Communication: Expressive difficulties (word  finding difficulties)  Cognition Arousal/Alertness: Awake/alert Behavior During Therapy: Anxious Overall Cognitive Status: No family/caregiver present to determine baseline cognitive functioning                      General Comments      Exercises     Assessment/Plan    PT Assessment All further PT needs can be met in the next venue of care  PT Problem List Decreased strength;Decreased activity tolerance;Decreased balance;Decreased mobility;Decreased knowledge of use of DME;Pain          PT Treatment Interventions      PT Goals (Current goals can be found in the Care Plan section)  Acute Rehab PT Goals Patient Stated Goal: to go home PT Goal Formulation: With patient Time For Goal Achievement: 05/13/16 Potential to Achieve Goals: Good    Frequency     Barriers to discharge        Co-evaluation               End of Session Equipment Utilized During Treatment: Gait belt (PRAFO) Activity Tolerance: Patient tolerated treatment well Patient left: in bed;with call bell/phone within reach Nurse Communication: Mobility status         Time: 1359-1420 PT Time Calculation (min) (ACUTE ONLY): 21 min   Charges:   PT Evaluation $PT Eval Moderate Complexity: 1 Procedure     PT G Codes:        Duncan Dull May 20, 2016, 2:36 PM  Alben Deeds, [place] DPT  [phone]

## 2016-04-29 NOTE — Progress Notes (Signed)
   Subjective: No acute events overnight. She is postop day 1. She is afebrile and hemodynamically stable. She has good by mouth intake. She is able to move around in bed. She has follow-up planned with orthopedic surgery in approximately 2 weeks. We'll plan for discharge this afternoon.  Objective:  Vital signs in last 24 hours: Vitals:   04/28/16 1915 04/28/16 1930 04/28/16 2002 04/29/16 0535  BP: (!) 156/64  137/61 132/63  Pulse: 91  87 85  Resp: 19  17 18   Temp:  98.1 F (36.7 C) 98.5 F (36.9 C) 98.6 F (37 C)  TempSrc:   Oral Oral  SpO2: 97%  98% 95%  Weight:      Height:       Physical Exam  Constitutional: She is oriented to person, place, and time. She appears well-developed.  Obese,   HENT:  Head: Normocephalic and atraumatic.  Poor oral dentition  Cardiovascular: Normal rate and regular rhythm.  Exam reveals no gallop and no friction rub.   No murmur heard. Respiratory: Effort normal and breath sounds normal. No respiratory distress. She has no wheezes.  GI: Soft. Bowel sounds are normal. She exhibits no distension. There is no tenderness.  Musculoskeletal:  Left leg in boot. Right leg with wound with pictures in the medical record.   Neurological: She is alert and oriented to person, place, and time.     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  In summary, patient is status post left partial calcaneal excision. Surgery was without complication. She is postop day 1 and is afebrile and hemodynamiclly stable. She  has good by mouth intake. Her pain is well-controlled. We'll plan for discharge today.  # Bilateral diabetic foot infection with Left osteomyelitis  Status post left partial calcaneal excision. Planned follow-up with orthopedic surgery.   # DM II with diabetic polyneuropathy  - will check hgba1c- 7.6 - put on SSI. Unclear what her home regimen is.  # HTN -- Losartan 25 mg once daily  # CKD vs AKI on CKD Cr improved. Most recent Cr of 1.19. Suspect AKI 2/2 prerenal etiologies most likely dehydration.  -- continue to monitor   # Anxiety  Patient says she normally takes xanax .25mg  TID. Anxiety improved today. I think most of her anxiety yesterday was due to anticipation of surgery.    DVT/PE prophylaxis: Heparin FEN/GI: Carbohydrate modified diet  Dispo: Discharge this afternoon.  Thomasene LotJames Timothy Trudell, MD 04/29/2016, 11:29 AM Pager: [phone]

## 2016-04-29 NOTE — Progress Notes (Signed)
Pt given discharge instructions, prescriptions, and care notes. Pt verbalized understanding AEB no further questions or concerns at this time. IV was discontinued, no redness, pain, or swelling noted at this time. Pt left the floor via wheelchair with staff in stable condition. 

## 2016-05-01 LAB — CULTURE, BLOOD (ROUTINE X 2): Culture: NO GROWTH

## 2016-05-05 ENCOUNTER — Encounter (HOSPITAL_COMMUNITY): Payer: Self-pay | Admitting: Orthopedic Surgery

## 2016-05-05 ENCOUNTER — Emergency Department (HOSPITAL_COMMUNITY): Payer: Medicare Other

## 2016-05-05 DIAGNOSIS — D649 Anemia, unspecified: Secondary | ICD-10-CM | POA: Diagnosis not present

## 2016-05-05 DIAGNOSIS — Z91040 Latex allergy status: Secondary | ICD-10-CM | POA: Insufficient documentation

## 2016-05-05 DIAGNOSIS — Z794 Long term (current) use of insulin: Secondary | ICD-10-CM | POA: Insufficient documentation

## 2016-05-05 DIAGNOSIS — E1122 Type 2 diabetes mellitus with diabetic chronic kidney disease: Secondary | ICD-10-CM | POA: Insufficient documentation

## 2016-05-05 DIAGNOSIS — D72829 Elevated white blood cell count, unspecified: Secondary | ICD-10-CM | POA: Diagnosis not present

## 2016-05-05 DIAGNOSIS — E1140 Type 2 diabetes mellitus with diabetic neuropathy, unspecified: Secondary | ICD-10-CM | POA: Diagnosis not present

## 2016-05-05 DIAGNOSIS — M79672 Pain in left foot: Secondary | ICD-10-CM | POA: Diagnosis present

## 2016-05-05 DIAGNOSIS — I129 Hypertensive chronic kidney disease with stage 1 through stage 4 chronic kidney disease, or unspecified chronic kidney disease: Secondary | ICD-10-CM | POA: Insufficient documentation

## 2016-05-05 DIAGNOSIS — M9689 Other intraoperative and postprocedural complications and disorders of the musculoskeletal system: Secondary | ICD-10-CM | POA: Diagnosis not present

## 2016-05-05 DIAGNOSIS — N189 Chronic kidney disease, unspecified: Secondary | ICD-10-CM | POA: Diagnosis not present

## 2016-05-05 DIAGNOSIS — G8918 Other acute postprocedural pain: Secondary | ICD-10-CM | POA: Insufficient documentation

## 2016-05-05 MED ORDER — OXYCODONE-ACETAMINOPHEN 5-325 MG PO TABS
1.0000 | ORAL_TABLET | Freq: Once | ORAL | Status: AC
  Administered 2016-05-05: 1 via ORAL

## 2016-05-05 MED ORDER — OXYCODONE-ACETAMINOPHEN 5-325 MG PO TABS
ORAL_TABLET | ORAL | Status: AC
  Filled 2016-05-05: qty 1

## 2016-05-05 NOTE — ED Triage Notes (Signed)
Pt. reports left foot pain with mild bleeding at dressing onset last night , denies injury , foot surgery last week 04/28/16 by Dr. Lajoyce Cornersuda. Denies fever or chills .

## 2016-05-05 NOTE — ED Notes (Signed)
Pt. refused blood specimen collected at triage .

## 2016-05-06 ENCOUNTER — Emergency Department (HOSPITAL_COMMUNITY)
Admission: EM | Admit: 2016-05-06 | Discharge: 2016-05-06 | Disposition: A | Discharge status: Home / Self Care | Payer: Medicare Other | Attending: Emergency Medicine | Admitting: Emergency Medicine

## 2016-05-06 DIAGNOSIS — M9689 Other intraoperative and postprocedural complications and disorders of the musculoskeletal system: Secondary | ICD-10-CM | POA: Diagnosis not present

## 2016-05-06 DIAGNOSIS — D72829 Elevated white blood cell count, unspecified: Secondary | ICD-10-CM

## 2016-05-06 DIAGNOSIS — L089 Local infection of the skin and subcutaneous tissue, unspecified: Secondary | ICD-10-CM

## 2016-05-06 DIAGNOSIS — D649 Anemia, unspecified: Secondary | ICD-10-CM

## 2016-05-06 DIAGNOSIS — N189 Chronic kidney disease, unspecified: Secondary | ICD-10-CM

## 2016-05-06 DIAGNOSIS — G8918 Other acute postprocedural pain: Secondary | ICD-10-CM

## 2016-05-06 LAB — COMPREHENSIVE METABOLIC PANEL
ALT: 9 U/L — AB (ref 14–54)
AST: 14 U/L — AB (ref 15–41)
Albumin: 3.2 g/dL — ABNORMAL LOW (ref 3.5–5.0)
Alkaline Phosphatase: 69 U/L (ref 38–126)
Anion gap: 9 (ref 5–15)
BUN: 23 mg/dL — AB (ref 6–20)
CHLORIDE: 102 mmol/L (ref 101–111)
CO2: 26 mmol/L (ref 22–32)
CREATININE: 1.95 mg/dL — AB (ref 0.44–1.00)
Calcium: 9.2 mg/dL (ref 8.9–10.3)
GFR calc Af Amer: 30 mL/min — ABNORMAL LOW (ref >60)
GFR, EST NON AFRICAN AMERICAN: 26 mL/min — AB (ref >60)
Glucose, Bld: 129 mg/dL — ABNORMAL HIGH (ref 65–99)
Potassium: 4.5 mmol/L (ref 3.5–5.1)
SODIUM: 137 mmol/L (ref 135–145)
Total Bilirubin: 0.2 mg/dL — ABNORMAL LOW (ref 0.3–1.2)
Total Protein: 7.6 g/dL (ref 6.5–8.1)

## 2016-05-06 LAB — CBC WITH DIFFERENTIAL/PLATELET
BASOS ABS: 0.1 10*3/uL (ref 0.0–0.1)
Basophils Relative: 1 %
EOS PCT: 3 %
Eosinophils Absolute: 0.4 10*3/uL (ref 0.0–0.7)
HCT: 34.1 % — ABNORMAL LOW (ref 36.0–46.0)
HEMOGLOBIN: 10.7 g/dL — AB (ref 12.0–15.0)
LYMPHS PCT: 21 %
Lymphs Abs: 2.7 10*3/uL (ref 0.7–4.0)
MCH: 24.7 pg — ABNORMAL LOW (ref 26.0–34.0)
MCHC: 31.4 g/dL (ref 30.0–36.0)
MCV: 78.8 fL (ref 78.0–100.0)
Monocytes Absolute: 0.9 10*3/uL (ref 0.1–1.0)
Monocytes Relative: 7 %
NEUTROS PCT: 68 %
Neutro Abs: 8.8 10*3/uL — ABNORMAL HIGH (ref 1.7–7.7)
PLATELETS: 449 10*3/uL — AB (ref 150–400)
RBC: 4.33 MIL/uL (ref 3.87–5.11)
RDW: 16.5 % — ABNORMAL HIGH (ref 11.5–15.5)
WBC: 12.7 10*3/uL — AB (ref 4.0–10.5)

## 2016-05-06 MED ORDER — SULFAMETHOXAZOLE-TRIMETHOPRIM 800-160 MG PO TABS: 1.0000 | ORAL_TABLET | Freq: Two times a day (BID) | ORAL | 0 refills | Status: AC

## 2016-05-06 MED ORDER — HYDROCODONE-ACETAMINOPHEN 5-325 MG PO TABS
2.0000 | ORAL_TABLET | Freq: Once | ORAL | Status: AC
  Administered 2016-05-06: 2 via ORAL
  Filled 2016-05-06: qty 2

## 2016-05-06 MED ORDER — SULFAMETHOXAZOLE-TRIMETHOPRIM 800-160 MG PO TABS
1.0000 | ORAL_TABLET | Freq: Once | ORAL | Status: AC
  Administered 2016-05-06: 1 via ORAL
  Filled 2016-05-06: qty 1

## 2016-05-06 NOTE — Care Management Note (Signed)
Case Management Note  Patient Details  Name: Diane MillinLinda K [name] MRN: [id number] Date of Birth: 1951-02-02  Subjective/Objective:                  From home with godson. /65 y.o. female patient with multiple past medical problems comes to the ER with left foot pain.  Action/Plan: Follow for disposition needs. /Home with home health services, possible wheelchair and Rx assistance for abx.   Expected Discharge Date:  05/06/16               Expected Discharge Plan:  Home w Home Health Services  In-House Referral:  NA  Discharge planning Services  CM Consult  Post Acute Care Choice:  Durable Medical Equipment, Home Health Choice offered to:  Patient    HH Arranged:  [facility]  Status of Service:  Completed, signed off  If discussed at Long Length of Stay Meetings, dates discussed:    Additional Comments: Ziggy Chanthavong J. Lucretia RoersWood, RN, BSN, [employer]CM [phone] Spoke with pt at bedside regarding [facility]. Offered pt list of home health agencies to choose from.  [facility]. [name].   DME needs identified at this time include wheelchair.  Pt condition has progressed to her needing a wheelchair.  Contacted Shannon of [facility] to order wheelchair.  Carollee HerterShannon will submit request to insurance for approval.  [facility] will await the decision of the insurance company.  EDCM consulted in regards to medicaction assistance.  Pt has insurance coverage and is not eligible for [facility].  NCM searched for Rx discount cards and found through [employer], pt abx will be $10 and no coupon is needed.     Oletta CohnWood, Phong Isenberg, RN 05/06/2016, 10:20 AM

## 2016-05-06 NOTE — ED Provider Notes (Signed)
Care assumed from Diane Peliffany Greene, PA-C, at shift change. Pt here with post op issue from recent calcaneal surgery, pt noncompliant with nonweightbearing status, appears there's an infection in the wound now but xrays reveal no osteomyelitis; full HPI and work up performed by prior provider, see their notes for further details. Plan is to await case manager consult to ensure pt can get bactrim x1 month supply (per prior provider's sign out, she consulted Dr. Lajoyce Cornersuda and this was the recommendation). Awaiting case management to arrive for consult.  Physical Exam  BP 150/73   Pulse 81   Temp 98.5 F (36.9 C) (Oral)   Resp 20   SpO2 94%   Physical Exam Gen: afebrile, VSS, NAD HEENT: EOMI, MMM Resp: no resp distress CV: rate WNL Abd: appearance normal, nondistended MsK: L foot in large postop shoe with padding, 1st digit amputated, chronic appearing deformity of 2nd digit. Did not remove shoe for full examination, please refer to prior provider's notes for full documentation Neuro: A&O x4  ED Course  Procedures Results for orders placed or performed during the hospital encounter of 05/06/16  CBC with Differential  Result Value Ref Range   WBC 12.7 (H) 4.0 - 10.5 K/uL   RBC 4.33 3.87 - 5.11 MIL/uL   Hemoglobin 10.7 (L) 12.0 - 15.0 g/dL   HCT 78.234.1 (L) 95.636.0 - 21.346.0 %   MCV 78.8 78.0 - 100.0 fL   MCH 24.7 (L) 26.0 - 34.0 pg   MCHC 31.4 30.0 - 36.0 g/dL   RDW 08.616.5 (H) 57.811.5 - 46.915.5 %   Platelets 449 (H) 150 - 400 K/uL   Neutrophils Relative % 68 %   Neutro Abs 8.8 (H) 1.7 - 7.7 K/uL   Lymphocytes Relative 21 %   Lymphs Abs 2.7 0.7 - 4.0 K/uL   Monocytes Relative 7 %   Monocytes Absolute 0.9 0.1 - 1.0 K/uL   Eosinophils Relative 3 %   Eosinophils Absolute 0.4 0.0 - 0.7 K/uL   Basophils Relative 1 %   Basophils Absolute 0.1 0.0 - 0.1 K/uL  Comprehensive metabolic panel  Result Value Ref Range   Sodium 137 135 - 145 mmol/L   Potassium 4.5 3.5 - 5.1 mmol/L   Chloride 102 101 - 111 mmol/L    CO2 26 22 - 32 mmol/L   Glucose, Bld 129 (H) 65 - 99 mg/dL   BUN 23 (H) 6 - 20 mg/dL   Creatinine, Ser 6.291.95 (H) 0.44 - 1.00 mg/dL   Calcium 9.2 8.9 - 52.810.3 mg/dL   Total Protein 7.6 6.5 - 8.1 g/dL   Albumin 3.2 (L) 3.5 - 5.0 g/dL   AST 14 (L) 15 - 41 U/L   ALT 9 (L) 14 - 54 U/L   Alkaline Phosphatase 69 38 - 126 U/L   Total Bilirubin 0.2 (L) 0.3 - 1.2 mg/dL   GFR calc non Af Amer 26 (L) >60 mL/min   GFR calc Af Amer 30 (L) >60 mL/min   Anion gap 9 5 - 15    Dg Foot Complete Left Result Date: 05/05/2016 CLINICAL DATA:  Acute onset of left foot pain and bleeding. Recent left foot surgery. Initial encounter. EXAM: LEFT FOOT - COMPLETE 3+ VIEW COMPARISON:  Left foot radiographs performed 04/26/2016, and MRI of the left ankle performed 04/27/2016 FINDINGS: The previously noted large soft tissue ulceration overlying the calcaneus is difficult to fully characterize on radiograph. The patient is status post resection of the inferior posterior calcaneus. No definite new osseous  erosion is seen to suggest osteomyelitis. Mild chronic lucency is noted about the patient's talocalcaneal screws. Midfoot and distal tibial hardware are grossly unremarkable in appearance. There is chronic resorption at the proximal interphalangeal joints. There is chronic deformity at the second metatarsal, reflecting remote injury. Diffuse soft tissue swelling is noted about the foot and ankle. IMPRESSION: 1. No definite new osseous erosion seen to suggest osteomyelitis. Status post resection of inferior posterior calcaneus. Previously noted large soft tissue ulceration overlying the calcaneus is not well characterized on radiograph. 2. Mild chronic lucency about talocalcaneal screws. Midfoot and distal tibial hardware are grossly unremarkable. 3. Chronic resorption at the proximal interphalangeal joints. Electronically Signed   By: Roanna RaiderJeffery  Chang M.D.   On: 05/05/2016 23:23     Meds ordered this encounter  Medications  .  oxyCODONE-acetaminophen (PERCOCET/ROXICET) 5-325 MG per tablet 1 tablet  . oxyCODONE-acetaminophen (PERCOCET/ROXICET) 5-325 MG per tablet    Sangalang, Diane [name]   : cabinet override  . HYDROcodone-acetaminophen (NORCO/VICODIN) 5-325 MG per tablet 2 tablet  . sulfamethoxazole-trimethoprim (BACTRIM DS,SEPTRA DS) 800-160 MG per tablet 1 tablet  . sulfamethoxazole-trimethoprim (BACTRIM DS,SEPTRA DS) 800-160 MG tablet    Sig: Take 1 tablet by mouth 2 (two) times daily. x1 month    Dispense:  60 tablet    Refill:  0    Order Specific Question:   Supervising Provider    Answer:   Diane [name], BRIAN [[id number]]     MDM:   ICD-9-CM ICD-10-CM   1. Post-operative pain 338.18 G89.18   2. Postoperative surgical complication involving musculoskeletal system associated with musculoskeletal procedure, unspecified complication 997.99 M96.89   3. Left foot infection 686.9 L08.9   4. Chronic anemia 285.9 D64.9   5. Leukocytosis, unspecified type 288.60 D72.829   6. Chronic kidney disease, unspecified CKD stage 585.9 N18.9    7:30 AM: awaiting social work. Pt updated with plan, acknowledges plan and has no questions at this time. Will continue to monitor and reassess after social work consult.   9:34 AM: social work saw pt, she reports she CAN afford her meds. Wants DME wheelchair rx, which has been ordered; social work also recommends home health nurse face-to-face eval, which has been ordered. Instructed pt to f/up with Dr. Lajoyce Cornersuda in 1wk for recheck and ongoing management. I explained the diagnosis and have given explicit precautions to return to the ER including for any other new or worsening symptoms. The patient understands and accepts the medical plan as it's been dictated and I have answered their questions. Discharge instructions concerning home care and prescriptions have been given. The patient is STABLE and is discharged to home in good condition.    Kavin Weckwerth Camprubi-Soms, PA-C 05/06/16 0935    Layla MawKristen N  Ward, DO 05/06/16 2312

## 2016-05-06 NOTE — Discharge Planning (Signed)
PT was discharged prior to receiving wheelchair.

## 2016-05-06 NOTE — ED Notes (Signed)
Pt requesting wheel chair for home use.

## 2016-05-06 NOTE — ED Notes (Signed)
Pt is in stable condition upon d/c and is escorted from ED via wheelchair. 

## 2016-05-06 NOTE — ED Provider Notes (Signed)
[facility]-EMERGENCY DEPT Provider Note   CSN: [id number] Arrival date & time: 05/05/16  2154     History   Chief Complaint Chief Complaint  Patient presents with  . Post-op Problem    HPI Diane [name] is a 65 y.o. female.  HPI   Patient with multiple past medical problems comes to the ER with left foot pain. She was in the ER and admitted on 12/21 receiving bilateral foot surgeries for wounds. She had calcaneal partial resection of the left foot and surgery on the right foot as well. Patient states that she is from [place] and has only been here for less than 2 weeks. She says that she has not been on her blood pressure medication or anxiety medications because she cannot afford them. Patient states that she's not taking any medication except for her hydrocodone at home. At discharge she was instructed very very low weightbearing but patient states that because her floor goes from carpet the hardwood the walker she has been provided requires her to put pressure on her feet. When she gets up to get out of bed she has put pressure on her feet. She states that today's ago when she went to put pressure on her foot she had a large pop in the foot started bleeding. He since then has been having pain and comes in for evaluation. No fevers, no weakness, no nausea, vomiting, diarrhea.       Past Medical History:  Diagnosis Date  . Anxiety   . Arthritis    "everywhere" (04/26/2016)  . Childhood asthma   . Chronic lower back pain   . Claustrophobia   . Complication of anesthesia    "had metallic taste in my mouth for a year after one of my ORs; next OR they changed some stuff; no problems since" (04/26/2016)  . Depression   . Diabetic polyneuropathy (HCC)   . Family history of adverse reaction to anesthesia    "mom; don't have an idea what it was" (04/26/2016)  . Fibromyalgia   . GERD (gastroesophageal reflux disease)   . Headache    "related to stress" (04/26/2016)  . Hypertension    . Manic, depressive (HCC)    "severe" (04/26/2016)  . Pneumonia    "long time ago" (04/26/2016)  . Sickle cell trait (HCC)   . Sleep apnea    "mask ordered; too claustrophobic to wear" (04/26/2016)  . Type II diabetes mellitus [facility])     Patient Active Problem List   Diagnosis Date Noted  . Osteomyelitis of ankle or foot (HCC)   . 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 Hypertension 04/26/2016  . DM (diabetes mellitus) type II controlled with renal manifestation (HCC) 04/26/2016  . Fibromyalgia 04/26/2016  . Diabetic polyneuropathy (HCC) 04/26/2016    Past Surgical History:  Procedure Laterality Date  . ABDOMINAL HYSTERECTOMY     "partial"  . APPENDECTOMY    . CALCANEAL OSTEOTOMY Left 04/28/2016   Procedure: CALCANEAL OSTEOTOMY;  Surgeon: Nadara Mustard, MD;  Location: [facility];  Service: Orthopedics;  Laterality: Left;  . CATARACT EXTRACTION W/ INTRAOCULAR LENS  IMPLANT, BILATERAL Bilateral   . FOOT SURGERY Left 2015  . FOOT SURGERY Right 04/2014   subtalar joint arthrodesis, talonavicualr joint arthrodesis, hammertoe repair 2,4,5 right foot  . FRACTURE SURGERY    . LAPAROSCOPIC CHOLECYSTECTOMY    . OPEN REDUCTION INTERNAL FIXATION (ORIF) TIBIA/FIBULA FRACTURE Left   .  TOE AMPUTATION Bilateral    toe hallux   . TONSILLECTOMY AND ADENOIDECTOMY      OB History    No data available       Home Medications    Prior to Admission medications   Medication Sig Start Date End Date Taking? Authorizing Provider  ALPRAZolam Prudy Feeler(XANAX) 0.5 MG tablet Take 0.5 mg by mouth 3 (three) times daily.   Yes Historical Provider, MD  donepezil (ARICEPT) 5 MG tablet Take 5 mg by mouth at bedtime.   Yes Historical Provider, MD  ergocalciferol (VITAMIN D2) 50000 units capsule Take 50,000 Units by mouth once a week.   Yes Historical Provider, MD  gabapentin (NEURONTIN) 300 MG capsule Take 300 mg by mouth 3  (three) times daily.   Yes Historical Provider, MD  HYDROcodone-acetaminophen (NORCO) 10-325 MG tablet Take 2 tablets by mouth every 6 (six) hours as needed for moderate pain.   Yes Historical Provider, MD  insulin aspart (NOVOLOG) 100 UNIT/ML injection Inject 1 Units into the skin 3 (three) times daily before meals. Sliding scale; 12 units max per day   Yes Historical Provider, MD  losartan (COZAAR) 25 MG tablet Take 25 mg by mouth daily.   Yes Historical Provider, MD  RABEprazole (ACIPHEX) 20 MG tablet Take 20 mg by mouth 2 (two) times daily.   Yes Historical Provider, MD  sertraline (ZOLOFT) 100 MG tablet Take 100 mg by mouth daily.   Yes Historical Provider, MD  oxyCODONE (OXY IR/ROXICODONE) 5 MG immediate release tablet Take 1 tablet (5 mg total) by mouth every 4 (four) hours as needed for breakthrough pain. 04/29/16   Thomasene LotJames Taylor, MD    Family History No family history on file.  Social History Social History  Substance Use Topics  . Smoking status: Never Smoker  . Smokeless tobacco: Never Used  . Alcohol use No     Allergies   Erythromycin; Biaxin [clarithromycin]; Feldene [piroxicam]; Lasix [furosemide]; Nsaids; Tetracyclines & related; Latex; Penicillins; and Tape   Review of Systems Review of Systems Review of Systems All other systems negative except as documented in the HPI. All pertinent positives and negatives as reviewed in the HPI.   Physical Exam Updated Vital Signs BP 140/72 (BP Location: Right Arm)   Pulse 81   Temp 98.5 F (36.9 C) (Oral)   Resp 20   SpO2 95%   Physical Exam  Constitutional: She is oriented to person, place, and time. She appears well-developed and well-nourished. No distress.  HENT:  Head: Normocephalic.  Neck: Normal range of motion. Neck supple.  Cardiovascular: Normal rate and regular rhythm.   Pulmonary/Chest: Effort normal and breath sounds normal. She has no wheezes. She has no rales.  Abdominal: Soft. Bowel sounds are normal.  There is no tenderness. There is no rebound and no guarding.  Musculoskeletal: Normal range of motion. a (See inserted picture). Left foot has large surgical incision with foul smell emitting from wound. Sutures are intact. The foot is not actively bleeding, pulses are palpable Neurological: She is alert and oriented to person, place, and time.  Skin: Skin is warm and dry. No rash noted.  Psychiatric: Anxious.          ED Treatments / Results  Labs (all labs ordered are listed, but only abnormal results are displayed) Labs Reviewed  CBC WITH DIFFERENTIAL/PLATELET - Abnormal; Notable for the following:       Result Value   WBC 12.7 (*)    Hemoglobin 10.7 (*)  HCT 34.1 (*)    MCH 24.7 (*)    RDW 16.5 (*)    Platelets 449 (*)    Neutro Abs 8.8 (*)    All other components within normal limits  COMPREHENSIVE METABOLIC PANEL - Abnormal; Notable for the following:    Glucose, Bld 129 (*)    BUN 23 (*)    Creatinine, Ser 1.95 (*)    Albumin 3.2 (*)    AST 14 (*)    ALT 9 (*)    Total Bilirubin 0.2 (*)    GFR calc non Af Amer 26 (*)    GFR calc Af Amer 30 (*)    All other components within normal limits    EKG  EKG Interpretation None       Radiology Dg Foot Complete Left  Result Date: 05/05/2016 CLINICAL DATA:  Acute onset of left foot pain and bleeding. Recent left foot surgery. Initial encounter. EXAM: LEFT FOOT - COMPLETE 3+ VIEW COMPARISON:  Left foot radiographs performed 04/26/2016, and MRI of the left ankle performed 04/27/2016 FINDINGS: The previously noted large soft tissue ulceration overlying the calcaneus is difficult to fully characterize on radiograph. The patient is status post resection of the inferior posterior calcaneus. No definite new osseous erosion is seen to suggest osteomyelitis. Mild chronic lucency is noted about the patient's talocalcaneal screws. Midfoot and distal tibial hardware are grossly unremarkable in appearance. There is chronic  resorption at the proximal interphalangeal joints. There is chronic deformity at the second metatarsal, reflecting remote injury. Diffuse soft tissue swelling is noted about the foot and ankle. IMPRESSION: 1. No definite new osseous erosion seen to suggest osteomyelitis. Status post resection of inferior posterior calcaneus. Previously noted large soft tissue ulceration overlying the calcaneus is not well characterized on radiograph. 2. Mild chronic lucency about talocalcaneal screws. Midfoot and distal tibial hardware are grossly unremarkable. 3. Chronic resorption at the proximal interphalangeal joints. Electronically Signed   By: Roanna RaiderJeffery  Chang M.D.   On: 05/05/2016 23:23    Procedures Procedures (including critical care time)  Medications Ordered in ED Medications  oxyCODONE-acetaminophen (PERCOCET/ROXICET) 5-325 MG per tablet 1 tablet (1 tablet Oral Given 05/05/16 2216)  HYDROcodone-acetaminophen (NORCO/VICODIN) 5-325 MG per tablet 2 tablet (2 tablets Oral Given 05/06/16 0354)  sulfamethoxazole-trimethoprim (BACTRIM DS,SEPTRA DS) 800-160 MG per tablet 1 tablet (1 tablet Oral Given 05/06/16 0452)     Initial Impression / Assessment and Plan / ED Course  I have reviewed the triage vital signs and the nursing notes.  Pertinent labs & imaging results that were available during my care of the patient were reviewed by me and considered in my medical decision making (see chart for details).  Clinical Course     I spoke with DR. Lajoyce Cornersuda regarding patient. He recommends dry dressing from now on and that she follow-up in the office. He recommended I prescribe doxycycline for 1 month but did not realize until after hanging up that she is allergic to that class of medications. Discussed with Dr. Elesa MassedWard, will rx Bactrim.  The patient says that she needs a wheel chair for home, she says that she will be unable to get her Bactrim rx and that she has been unable to get her anxiety medication and blood  pressure medications. Dry dressing placed, bactrim and pain medication given in the ED.   Pt sign out to Street, VF CorporationPA-C at end of shift. Patient needs Case Manager consult in the morning, for wheel chair- so that she can stay off  of her feet. She also needs medication assistance. Pharmacy reconciliation ordered to determine home meds. It is unclear if she has made PCP appointment or not, she needs to see Dr. Lajoyce Corners in 1 week.    Final Clinical Impressions(s) / ED Diagnoses   Final diagnoses:  Post-operative pain  Postoperative surgical complication involving musculoskeletal system associated with musculoskeletal procedure, unspecified complication    New Prescriptions New Prescriptions   No medications on file     Marlon Pel, PA-C 05/06/16 0530    Marlon Pel, PA-C 05/06/16 0544    Layla Maw Ward, DO 05/06/16 1610

## 2016-05-06 NOTE — ED Notes (Signed)
Patient refused to have labs drawn. 

## 2016-05-06 NOTE — Discharge Instructions (Signed)
There is an order for a wheelchair in your chart. Use your walker as directed by your surgeon. Take bactrim as directed until completed for your wound infection. Use all other home medications as instructed by your doctor. Follow up with Dr. Lajoyce Cornersuda in 1 week for recheck of wound and ongoing management of your wound/foot pain. Return to the ER for emergent changes or worsening symptoms.

## 2016-05-06 NOTE — ED Notes (Signed)
Pt states she is having pain and bleeding to the left foot.

## 2016-05-11 ENCOUNTER — Telehealth (INDEPENDENT_AMBULATORY_CARE_PROVIDER_SITE_OTHER): Payer: Self-pay | Admitting: Orthopedic Surgery

## 2016-05-11 NOTE — Telephone Encounter (Signed)
[name]  [facility] Said pt non compliant and only takes meds when she wants them and refuses to check her blood sugar, non compliant with diet, etc.  She had a lot to advise about this pt. She just wanted to inform us but if we needed to call back for more information we can.  She is seeing 3x times wk for wound care.   [facility]   Barth KirksLottie [phone]   Message originally transposed under incorrect chart. I called and clarified with Diane [name] the patients first name and dob. Patient has appointment tomorrow to follow up with PCP. Per MD patient will follow up with us for orthopedics.

## 2016-05-12 ENCOUNTER — Other Ambulatory Visit: Payer: Self-pay | Admitting: Physician Assistant

## 2016-05-12 ENCOUNTER — Ambulatory Visit: Payer: Medicare PPO | Attending: Internal Medicine | Admitting: Physician Assistant

## 2016-05-12 ENCOUNTER — Telehealth: Payer: Self-pay | Admitting: Internal Medicine

## 2016-05-12 ENCOUNTER — Ambulatory Visit (INDEPENDENT_AMBULATORY_CARE_PROVIDER_SITE_OTHER): Payer: Medicare PPO | Admitting: Family

## 2016-05-12 ENCOUNTER — Encounter: Payer: Self-pay | Admitting: Physician Assistant

## 2016-05-12 VITALS — BP 120/81 | HR 88 | Temp 98.3°F | Resp 16

## 2016-05-12 DIAGNOSIS — E11621 Type 2 diabetes mellitus with foot ulcer: Secondary | ICD-10-CM | POA: Diagnosis not present

## 2016-05-12 DIAGNOSIS — Z5189 Encounter for other specified aftercare: Secondary | ICD-10-CM | POA: Insufficient documentation

## 2016-05-12 DIAGNOSIS — Z79899 Other long term (current) drug therapy: Secondary | ICD-10-CM | POA: Diagnosis not present

## 2016-05-12 DIAGNOSIS — I10 Essential (primary) hypertension: Secondary | ICD-10-CM

## 2016-05-12 DIAGNOSIS — L97415 Non-pressure chronic ulcer of right heel and midfoot with muscle involvement without evidence of necrosis: Secondary | ICD-10-CM

## 2016-05-12 DIAGNOSIS — E1129 Type 2 diabetes mellitus with other diabetic kidney complication: Secondary | ICD-10-CM

## 2016-05-12 DIAGNOSIS — F411 Generalized anxiety disorder: Secondary | ICD-10-CM

## 2016-05-12 DIAGNOSIS — L97411 Non-pressure chronic ulcer of right heel and midfoot limited to breakdown of skin: Secondary | ICD-10-CM

## 2016-05-12 DIAGNOSIS — L97426 Non-pressure chronic ulcer of left heel and midfoot with bone involvement without evidence of necrosis: Secondary | ICD-10-CM

## 2016-05-12 DIAGNOSIS — Z794 Long term (current) use of insulin: Secondary | ICD-10-CM | POA: Diagnosis not present

## 2016-05-12 DIAGNOSIS — Z880 Allergy status to penicillin: Secondary | ICD-10-CM | POA: Insufficient documentation

## 2016-05-12 LAB — CBC WITH DIFFERENTIAL/PLATELET
BASOS ABS: 61 {cells}/uL (ref 0–200)
BASOS PCT: 1 %
EOS ABS: 244 {cells}/uL (ref 15–500)
Eosinophils Relative: 4 %
HCT: 34.4 % — ABNORMAL LOW (ref 35.0–45.0)
HEMOGLOBIN: 10.6 g/dL — AB (ref 11.7–15.5)
Lymphocytes Relative: 23 %
Lymphs Abs: 1403 cells/uL (ref 850–3900)
MCH: 24.4 pg — AB (ref 27.0–33.0)
MCHC: 30.8 g/dL — ABNORMAL LOW (ref 32.0–36.0)
MCV: 79.1 fL — AB (ref 80.0–100.0)
MONO ABS: 366 {cells}/uL (ref 200–950)
MPV: 9.1 fL (ref 7.5–12.5)
Monocytes Relative: 6 %
NEUTROS ABS: 4026 {cells}/uL (ref 1500–7800)
Neutrophils Relative %: 66 %
Platelets: 351 10*3/uL (ref 140–400)
RBC: 4.35 MIL/uL (ref 3.80–5.10)
RDW: 17 % — ABNORMAL HIGH (ref 11.0–15.0)
WBC: 6.1 10*3/uL (ref 3.8–10.8)

## 2016-05-12 LAB — BASIC METABOLIC PANEL
BUN: 25 mg/dL (ref 7–25)
CHLORIDE: 107 mmol/L (ref 98–110)
CO2: 18 mmol/L — AB (ref 20–31)
Calcium: 9 mg/dL (ref 8.6–10.4)
Creat: 1.55 mg/dL — ABNORMAL HIGH (ref 0.50–0.99)
Glucose, Bld: 128 mg/dL — ABNORMAL HIGH (ref 65–99)
Potassium: 4.6 mmol/L (ref 3.5–5.3)
Sodium: 139 mmol/L (ref 135–146)

## 2016-05-12 LAB — GLUCOSE, POCT (MANUAL RESULT ENTRY): POC GLUCOSE: 165 mg/dL — AB (ref 70–99)

## 2016-05-12 MED ORDER — GABAPENTIN 300 MG PO CAPS: 300.0000 mg | ORAL_CAPSULE | Freq: Three times a day (TID) | ORAL | 3 refills | Status: DC

## 2016-05-12 NOTE — Patient Instructions (Signed)
Check blood sugar three times daily and record and bring to next visit.   

## 2016-05-12 NOTE — Telephone Encounter (Signed)
Could you clarify if able because they are wanting to know these things and patient is not establish with a pcp yet not until 01//25/2018

## 2016-05-12 NOTE — Telephone Encounter (Signed)
Pt would like it rxs sent to [facility] on [address] all of them

## 2016-05-12 NOTE — Telephone Encounter (Signed)
Which pharmacy does she want gabapentin sent to?

## 2016-05-12 NOTE — Telephone Encounter (Signed)
Will forward to stacey 

## 2016-05-12 NOTE — Progress Notes (Signed)
Laiana Fratus, is a 66 y.o. female  ZOX:[id number]  WJX:[id number]  DOB - 10/14/1950  Subjective:  Chief Complaint and HPI: Mazella Deen is a 66 y.o. 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  She was in the process of relocating here from [place] when she started having worsening problems with her foot and had to be hospitalized.  She has been diabetic and in poor health for many years.  She was on Lantus and sliding scale prior to hospitalization and was discharged only on sliding scale.  She is on a 1 month prescription of Septra DS.  She needs a prescription for a wheel chair today as she is ordered to be non-weight bearing.  She sees Dr Lajoyce Corners (ortho) today immediately following her visit here to have her feet checked.  She denies any f/c.  She also wants a handicap placard.  She is requesting a referral to a psychiatrist so she can get her xanax filled. She denies SI/HI.    ED/Hospital notes reviewed.    ROS:   Constitutional:  No f/c, No night sweats, No unexplained weight loss. EENT:  No vision changes, No blurry vision, No hearing changes. No mouth, throat, or ear problems.  Respiratory: No cough, No SOB Cardiac: No CP, no palpitations GI:  No abd pain, No N/V/D. GU: No Urinary s/sx Musculoskeletal: No joint pain Neuro: No headache, no dizziness, no motor weakness.  Skin: No rash Endocrine:  No polydipsia. No polyuria.  Psych: Denies SI/HI  No problems updated.  ALLERGIES: Allergies  Allergen Reactions  . Erythromycin Anaphylaxis  . Biaxin [Clarithromycin] Other (See Comments)  . Feldene [Piroxicam] Diarrhea, Nausea And Vomiting and Other (See Comments)    And sores in mouth  . Lasix [Furosemide] Other (See Comments)    Sores all over body  . Nsaids Other (See Comments)    Contraindicated because of her other meds  . Tetracyclines &  Related   . Latex Rash  . Penicillins Rash and Other (See Comments)    Has patient had a PCN reaction causing immediate rash, facial/tongue/throat swelling, SOB or lightheadedness with hypotension: Yes Has patient had a PCN reaction causing severe rash involving mucus membranes or skin necrosis: No Has patient had a PCN reaction that required hospitalization No Has patient had a PCN reaction occurring within the last 10 years: No If all of the above answers are "NO", then may proceed with Cephalosporin use. And sores in her mouth   . Tape Rash    PAST MEDICAL HISTORY: Past Medical History:  Diagnosis Date  . Anxiety   . Arthritis    "everywhere" (04/26/2016)  . Childhood asthma   . Chronic lower back pain   . Claustrophobia   . Complication of anesthesia    "had metallic taste in my mouth for a year after one of my ORs; next OR they changed some stuff; no problems since" (04/26/2016)  . Depression   . Diabetic polyneuropathy (HCC)   . Family history of adverse reaction to anesthesia    "mom; don't have an idea what it was" (04/26/2016)  . Fibromyalgia   . GERD (gastroesophageal reflux disease)   . Headache    "related to stress" (04/26/2016)  . Hypertension   . Manic, depressive (HCC)    "severe" (04/26/2016)  . Pneumonia    "long time ago" (04/26/2016)  . Sickle cell trait (HCC)   .  Sleep apnea    "mask ordered; too claustrophobic to wear" (04/26/2016)  . Type II diabetes mellitus (HCC)     MEDICATIONS AT HOME: Prior to Admission medications   Medication Sig Start Date End Date Taking? Authorizing Provider  ALPRAZolam Prudy Feeler) 0.5 MG tablet Take 0.5 mg by mouth 3 (three) times daily.    Historical Provider, MD  donepezil (ARICEPT) 5 MG tablet Take 5 mg by mouth at bedtime.    Historical Provider, MD  ergocalciferol (VITAMIN D2) 50000 units capsule Take 50,000 Units by mouth once a week.    Historical Provider, MD  gabapentin (NEURONTIN) 300 MG capsule Take 300 mg by  mouth 3 (three) times daily.    Historical Provider, MD  HYDROcodone-acetaminophen (NORCO) 10-325 MG tablet Take 2 tablets by mouth every 6 (six) hours as needed for moderate pain.    Historical Provider, MD  insulin aspart (NOVOLOG) 100 UNIT/ML injection Inject 1 Units into the skin 3 (three) times daily before meals. Sliding scale; 12 units max per day    Historical Provider, MD  losartan (COZAAR) 25 MG tablet Take 25 mg by mouth daily.    Historical Provider, MD  oxyCODONE (OXY IR/ROXICODONE) 5 MG immediate release tablet Take 1 tablet (5 mg total) by mouth every 4 (four) hours as needed for breakthrough pain. 04/29/16   Thomasene Lot, MD  RABEprazole (ACIPHEX) 20 MG tablet Take 20 mg by mouth 2 (two) times daily.    Historical Provider, MD  sertraline (ZOLOFT) 100 MG tablet Take 100 mg by mouth daily.    Historical Provider, MD  sulfamethoxazole-trimethoprim (BACTRIM DS,SEPTRA DS) 800-160 MG tablet Take 1 tablet by mouth 2 (two) times daily. x1 month 05/06/16 06/05/16  Mercedes Camprubi-Soms, PA-C     Objective:  EXAM:   Vitals:   05/12/16 0938  BP: 120/81  Pulse: 88  Resp: 16  Temp: 98.3 F (36.8 C)  TempSrc: Oral  SpO2: 96%    General appearance : A&OX3. NAD. Non-toxic-appearing.  In a wheel chair for visit.  HEENT: Atraumatic and Normocephalic.  PERRLA. EOM intact.  TM clear B. Mouth-MMM, post pharynx WNL w/o erythema, No PND. Neck: supple, no JVD. No cervical lymphadenopathy. No thyromegaly Chest/Lungs:  Breathing-non-labored, Good air entry bilaterally, breath sounds normal without rales, rhonchi, or wheezing  CVS: S1 S2 regular, no murmurs, gallops, rubs  Extremities: Bilateral Lower Ext shows no edema, both legs are warm to touch with = pulse throughout.  L leg not unwrapped-she is on time constraints to get to her appt with Dr Lajoyce Corners and they will be assessing this there.   Neurology:  CN II-XII grossly intact, Non focal.   Psych:  TP linear. J/I WNL. Speech is tangential.  Appropriate eye contact and affect.  Skin:  No Rash  Data Review Lab Results  Component Value Date   HGBA1C 7.6 (H) 04/26/2016     Assessment & Plan   1. Anxiety state - Ambulatory referral to Psychiatry-info for [facility] also given  2. Essential hypertension Controlled.  3. 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  Check sugars 3X daily and record and bring to next visit.  Rx for wheel chair and disability placard filled out. ROI  for medical records from [place].   Patient have been counseled extensively about nutrition and exercise  Return in about 3 weeks (around 06/02/2016) for establish with PCP; DM management.  The patient was given clear instructions to go to ER or return to medical center if symptoms don't improve, worsen or new problems develop. The patient verbalized understanding. The patient was told to call to get lab results if they haven't heard anything in the next week.     Georgian CoAngela Rodney Wigger, PA-C [facility] [place], [place] [phone]   05/12/2016, 10:14 AMPatient ID: Kela MillinLinda K Alen, female   DOB: 06-Aug-1950, 66 y.o.   MRN: [id number]

## 2016-05-12 NOTE — Telephone Encounter (Signed)
Patient was seen by Dr. Sharon SellerMcClung, and was prescribed her gabapentin. Pt needs to speak with nurse regarding the remaining of her medication. Pt needs refill on all of them. Please follow up with patient.   Thank you.

## 2016-05-12 NOTE — Progress Notes (Signed)
Office Visit Note   Patient: Diane [name]           Date of Birth: 1950/08/07           MRN: [id number] Visit Date: 05/12/2016              Requested by: No referring provider defined for this encounter. PCP: No PCP Per Patient  Chief Complaint  Patient presents with  . Right Foot - Wound Check  . Left Foot - Routine Post Op    HPI: The patient is a 66 year old woman who is seen today in follow-up 2 weeks status post partial calcaneal excision on the left also seen in follow-up for a heel ulcer on the right. Home health has been doing dressing changes with dry gauze 3 times a week to both wounds. Copious drainage from the right heel wound.  States she just moved here from [place]. Has a order for a wheelchair in her hand is wondering where she can take this to get a wheelchair. Has been using the right foot to get around the house.    Assessment & Plan: Visit Diagnoses:  1. 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 Diabetic ulcer of right heel associated with type 2 diabetes mellitus, with muscle involvement without evidence of necrosis (HCC)     Plan: dial soap cleansing daily to bilateral foot ulcers. Dry dressings to incision over the left heel. Elevate this no weightbearing. To the right foot him there will continue doing daily dressing changes Silvadene was called into pharmacy will pack this wound with Silvadene and gauze. Advised not to weight-bear on this foot either. She'll continue her course of Bactrim.  Follow-Up Instructions: Return in about 2 weeks (around 05/26/2016).   Physical Exam: Patient is alert and oriented. No adenopathy. Well-dressed. Normal affect. Respirations easy. Ambulatory in a wheelchair today. The incision to the left heel is well approximated with sutures. There is no drainage or surrounding erythema. Does have moderate swelling to the foot and ankle. The  right heel, plantar aspect also has ulceration. This is 35 mmx 25 mm and is 15 mm deep. Does not probe to bone. Is filled in with 90% granulation tissue. There is copious serosanguinous drainage. No surrounding erythema or cellulitis.    Imaging: No results found.  Orders:  No orders of the defined types were placed in this encounter.  Meds ordered this encounter  Medications  . gabapentin (NEURONTIN) 300 MG capsule    Sig: Take 1 capsule (300 mg total) by mouth 3 (three) times daily.    Dispense:  90 capsule    Refill:  3  . gabapentin (NEURONTIN) 300 MG capsule    Sig: Take 1 capsule (300 mg total) by mouth 3 (three) times daily.    Dispense:  90 capsule    Refill:  3     Procedures: No procedures performed  Clinical Data: No additional findings.  Subjective: Review of Systems  Constitutional: Negative for chills and fever.  Skin: Positive for wound. Negative for color change.    Objective: Vital Signs: There were no vitals taken for this visit.  Specialty Comments:  No specialty comments available.  PMFS History: Patient Active Problem List   Diagnosis Date Noted  . Diabetic ulcer of right heel associated with type 2 diabetes mellitus (HCC) 05/12/2016  . Osteomyelitis of ankle or foot (HCC)   . Diabetic ulcer  of left heel associated with type 2 diabetes mellitus, with bone involvement without evidence of necrosis (HCC)   . Hypertension 04/26/2016  . DM (diabetes mellitus) type II controlled with renal manifestation (HCC) 04/26/2016  . Fibromyalgia 04/26/2016  . Diabetic polyneuropathy (HCC) 04/26/2016   Past Medical History:  Diagnosis Date  . Anxiety   . Arthritis    "everywhere" (04/26/2016)  . Childhood asthma   . Chronic lower back pain   . Claustrophobia   . Complication of anesthesia    "had metallic taste in my mouth for a year after one of my ORs; next OR they changed some stuff; no problems since" (04/26/2016)  . Depression   . Diabetic  polyneuropathy (HCC)   . Family history of adverse reaction to anesthesia    "mom; don't have an idea what it was" (04/26/2016)  . Fibromyalgia   . GERD (gastroesophageal reflux disease)   . Headache    "related to stress" (04/26/2016)  . Hypertension   . Manic, depressive (HCC)    "severe" (04/26/2016)  . Pneumonia    "long time ago" (04/26/2016)  . Sickle cell trait (HCC)   . Sleep apnea    "mask ordered; too claustrophobic to wear" (04/26/2016)  . Type II diabetes mellitus (HCC)     No family history on file.  Past Surgical History:  Procedure Laterality Date  . ABDOMINAL HYSTERECTOMY     "partial"  . APPENDECTOMY    . CALCANEAL OSTEOTOMY Left 04/28/2016   Procedure: CALCANEAL OSTEOTOMY;  Surgeon: Nadara MustardMarcus V Duda, MD;  [facility];  Service: Orthopedics;  Laterality: Left;  . CATARACT EXTRACTION W/ INTRAOCULAR LENS  IMPLANT, BILATERAL Bilateral   . FOOT SURGERY Left 2015  . FOOT SURGERY Right 04/2014   subtalar joint arthrodesis, talonavicualr joint arthrodesis, hammertoe repair 2,4,5 right foot  . FRACTURE SURGERY    . LAPAROSCOPIC CHOLECYSTECTOMY    . OPEN REDUCTION INTERNAL FIXATION (ORIF) TIBIA/FIBULA FRACTURE Left   . TOE AMPUTATION Bilateral    toe hallux   . TONSILLECTOMY AND ADENOIDECTOMY     Social History   Occupational History  . Not on file.   Social History Main Topics  . Smoking status: Never Smoker  . Smokeless tobacco: Never Used  . Alcohol use No  . Drug use: No  . Sexual activity: No

## 2016-05-12 NOTE — Telephone Encounter (Signed)
Discussed with Georgian CoAngela McClung,  We do not have a set sliding scale for her, she was admitted to the hospital with one and discharged with the same and patient was to continue that until she could be established with a primary care provider.  Marylene Landngela told her to check her CBGs three times daily.  No Lantus for now- it was discontinued in the hospital.

## 2016-05-12 NOTE — Telephone Encounter (Signed)
Lyla SonCarrie from [facility] calling requesting a list of pts medications to be faxed to : [phone]  Lyla SonCarrie also requesting clarification on the sliding scale of the Novolog, clarification on how many times a day the pt should be monitoring her blood sugar and if she was instructed to take the Lantus 14 units two times a day--states pt says she was not instructed of this

## 2016-05-12 NOTE — Telephone Encounter (Signed)
Will forward to angela 

## 2016-05-13 ENCOUNTER — Other Ambulatory Visit: Payer: Self-pay | Admitting: Physician Assistant

## 2016-05-13 ENCOUNTER — Telehealth: Payer: Self-pay | Admitting: Licensed Clinical Social Worker

## 2016-05-13 MED ORDER — INSULIN ASPART 100 UNIT/ML ~~LOC~~ SOLN: 1.0000 [IU] | Freq: Three times a day (TID) | SUBCUTANEOUS | 2 refills | Status: DC

## 2016-05-13 MED ORDER — DONEPEZIL HCL 5 MG PO TABS: 5.0000 mg | ORAL_TABLET | Freq: Every day | ORAL | 5 refills | Status: AC

## 2016-05-13 MED ORDER — LOSARTAN POTASSIUM 25 MG PO TABS: 25.0000 mg | ORAL_TABLET | Freq: Every day | ORAL | 1 refills | Status: DC

## 2016-05-13 MED ORDER — ERGOCALCIFEROL 1.25 MG (50000 UT) PO CAPS: 50000.0000 [IU] | ORAL_CAPSULE | ORAL | 0 refills | Status: AC

## 2016-05-13 MED ORDER — GABAPENTIN 300 MG PO CAPS: 300.0000 mg | ORAL_CAPSULE | Freq: Three times a day (TID) | ORAL | 3 refills | Status: DC

## 2016-05-13 MED ORDER — SERTRALINE HCL 100 MG PO TABS: 100.0000 mg | ORAL_TABLET | Freq: Every day | ORAL | 1 refills | Status: DC

## 2016-05-13 MED ORDER — ALPRAZOLAM 0.5 MG PO TABS: 0.5000 mg | ORAL_TABLET | Freq: Three times a day (TID) | ORAL | 0 refills | Status: DC

## 2016-05-13 MED ORDER — RABEPRAZOLE SODIUM 20 MG PO TBEC: 20.0000 mg | DELAYED_RELEASE_TABLET | Freq: Two times a day (BID) | ORAL | 5 refills | Status: DC

## 2016-05-13 NOTE — Telephone Encounter (Signed)
I spoke to patient and she is aware of the rx for xanax. Pt states she would like a referral to psychiatry because she know that is what she needing. I explained to patient that it does take 1-2 weeks for the referral. Pt states she understands and doesn't have any questions or concerns. Pt states she will come by Monday morning to pick up rx and handicap form.

## 2016-05-13 NOTE — Telephone Encounter (Signed)
LCSWA introduced self and explained role at [facility]. LCSWA disclosed that she received a consult from PA-C McMclung to address anxiety and depression.   Pt reported that she has been experiencing anxiety and depression for "many years". She stated that her symptoms are difficulty sleeping, decreased appetite, and difficulty concentrating. Pt's triggers are her neuropathy in her hands/feets, in addition, to the recent surgeries on her feet. Pt [place]located to [place], [place] from [place] and is currently residing with her long-time friend, Harlow Maresgodson, and his three year old child.   LCSWA educated pt on the cycle of depression and anxiety and discussed healthy coping skills to decrease symptoms. Pt verbalized understanding of how mental and physical health correlates to one another, in addition, to the importance of medication compliance to manage diabetes. She has started to check her blood sugar 3 x daily and is trying to eat a healthier diet.   LCSWA educated pt on community resources for food assistance (i.e. [employer]) and psychotherapy. Pt agreed to schedule appointment with LCSWA should her symptoms worsen or fail to improve. No additional concerns noted.

## 2016-05-13 NOTE — Telephone Encounter (Signed)
I sent all prescriptions to [facility].  I printed her a one time prescription for Xanax that she can pick up at her convenience, but please let her know that this may not be something her doctor here will prescribe on an ongoing basis.  Her doctor may prefer this be handled by Psychiatry.  She should take this medication sparingly as it does have chemical dependence properties.   Thanks, Georgian CoAngela Jama Krichbaum, PA-C

## 2016-05-13 NOTE — Telephone Encounter (Signed)
Will forward to kim 

## 2016-05-14 NOTE — Telephone Encounter (Signed)
Her referral was put in the day of her office visit as noted on her AVS.  She was also given information on [facility].

## 2016-05-16 ENCOUNTER — Telehealth: Payer: Self-pay | Admitting: Internal Medicine

## 2016-05-16 NOTE — Telephone Encounter (Signed)
Contacted Carried from [facility] on 05/13/2016 and explained what Misty StanleyStacey and [place] response. After I explained the response she got mad and stated well how she is suppose to recon. Pt medication and she doesn't have the all the information I informed her that she went into the hospital with the sliding scale and she was discharged. Carrie asked when is she suppose to see her pcp and who will the pcp be. I informed carrie that patient has to call 2 weeks before the 25th to schedule that appointment and I wouldn't know who her pcp will be because it will be with a provider that has an opening around that time. Lyla SonCarrie than stated that this is ridiculous and we are given poor patient satification and pt is not going to be able to wait that long and the pt will have to find another pcp to go to. Lyla SonCarrie than stated she is going to talk to her supervisor cause this doesn't make sense made kim, stacey and angela aware

## 2016-05-16 NOTE — Telephone Encounter (Signed)
Pt came in to pick up Xanax Rx and stated she is in pain from her recent foot surgery as well as from her fibromyalgia and neuropathy and requested a referral to a pain management clinic due to clinic restrictions on certain pain medications   Pt has not established with a PCP yet

## 2016-05-18 ENCOUNTER — Telehealth: Payer: Self-pay | Admitting: General Practice

## 2016-05-18 NOTE — Telephone Encounter (Signed)
Patient lost prescription for Xanax and would like another prescription
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
## 2016-05-19 NOTE — Telephone Encounter (Signed)
Could you call pt and make aware

## 2016-05-19 NOTE — Telephone Encounter (Signed)
The patient is supposed to be taking her sliding scale as she was prior to hospital admission.  I was unable to get a clear answer from the patient as to exactly how it was dosed, but her blood sugars were running around 140s; A1C was not dangerously high.    We have requested her records from [place].  We have recommended she take her sliding scale insulin as she was prior to admission and record her blood sugars 4 times daily while we obtain records.  The plan was to adjust her doses after a few weeks based on readings since we have little history to go on.

## 2016-05-19 NOTE — Telephone Encounter (Signed)
Pt was informed that another Xanax Rx would not be placed

## 2016-05-19 NOTE — Telephone Encounter (Signed)
The surgeons will need to manage the pain prescriptions.  I am unable to provide an additional xanax prescription due to a lost prescription since this is a controlled substance.

## 2016-05-20 ENCOUNTER — Telehealth (INDEPENDENT_AMBULATORY_CARE_PROVIDER_SITE_OTHER): Payer: Self-pay | Admitting: Orthopedic Surgery

## 2016-05-20 NOTE — Telephone Encounter (Signed)
I called and left voicemail advising verbal ok per MD for message below.

## 2016-05-20 NOTE — Telephone Encounter (Signed)
Diane LankJacqueline ( Medical Social worker) with [facility] called needing verbal orders 1 wk 3  for Scientist, product/process developmentcommunity resource management. The number to contact her is [phone]

## 2016-05-26 ENCOUNTER — Ambulatory Visit (INDEPENDENT_AMBULATORY_CARE_PROVIDER_SITE_OTHER): Payer: Self-pay | Admitting: Orthopedic Surgery

## 2016-05-27 ENCOUNTER — Telehealth (INDEPENDENT_AMBULATORY_CARE_PROVIDER_SITE_OTHER): Payer: Self-pay | Admitting: *Deleted

## 2016-05-27 ENCOUNTER — Telehealth (INDEPENDENT_AMBULATORY_CARE_PROVIDER_SITE_OTHER): Payer: Self-pay | Admitting: Orthopedic Surgery

## 2016-05-27 NOTE — Telephone Encounter (Signed)
[facility] calling for verbal for another visit, she missed her last visit because of the weather. Asking to add visit to end of plan of care

## 2016-05-27 NOTE — Telephone Encounter (Signed)
I called and sw HHN to advise that we are an orthopedic surgical office and can not advise insulin instructions beyond what the d/c summary had advised. The nurse states that the pt moved here from [place] and that she has schizophrenia and is not a good historian. The pt instructions were for 1ml with meals no more than 12 per day. The nurse is trying to reconcile the meds and is not able to do so based off of this med history. I advised that if there is anything that I can do to please let me know but we can not give insulin instructions and that the attending in the hospital maybe who she should call for directions.

## 2016-05-27 NOTE — Telephone Encounter (Signed)
Carie from [facility] calling for pt insulin directions?    [phone]

## 2016-05-27 NOTE — Telephone Encounter (Signed)
Called to give verbal ok.

## 2016-05-28 ENCOUNTER — Encounter (INDEPENDENT_AMBULATORY_CARE_PROVIDER_SITE_OTHER): Payer: Self-pay | Admitting: Orthopedic Surgery

## 2016-05-28 ENCOUNTER — Ambulatory Visit (INDEPENDENT_AMBULATORY_CARE_PROVIDER_SITE_OTHER): Payer: Medicare PPO | Admitting: Orthopedic Surgery

## 2016-05-28 DIAGNOSIS — E11621 Type 2 diabetes mellitus with foot ulcer: Secondary | ICD-10-CM | POA: Diagnosis not present

## 2016-05-28 DIAGNOSIS — E1129 Type 2 diabetes mellitus with other diabetic kidney complication: Secondary | ICD-10-CM

## 2016-05-28 DIAGNOSIS — L97511 Non-pressure chronic ulcer of other part of right foot limited to breakdown of skin: Secondary | ICD-10-CM

## 2016-05-28 DIAGNOSIS — M869 Osteomyelitis, unspecified: Secondary | ICD-10-CM

## 2016-05-28 DIAGNOSIS — L97415 Non-pressure chronic ulcer of right heel and midfoot with muscle involvement without evidence of necrosis: Secondary | ICD-10-CM | POA: Diagnosis not present

## 2016-05-28 DIAGNOSIS — Z794 Long term (current) use of insulin: Secondary | ICD-10-CM

## 2016-05-28 MED ORDER — GABAPENTIN 300 MG PO CAPS: 600.0000 mg | ORAL_CAPSULE | Freq: Three times a day (TID) | ORAL | 3 refills | Status: DC

## 2016-05-28 NOTE — Progress Notes (Signed)
Office Visit Note   Patient: Diane [name]           Date of Birth: [birth year]           MRN: [id number] Visit Date: 05/28/2016              Requested by: No referring provider defined for this encounter. PCP: No PCP Per Patient  Chief Complaint  Patient presents with  . Left Foot - Routine Post Op  . Right Foot - Open Wound, Pain    [id number] presents for 2 separate problems #1 status post Partial calcaneal excision on the left #2 chronic Wagner grade 1 ulcer plantar aspect of the right foot. Patient states that her right foot ulcer is getting larger. She states that her Neurontin does not help with her neuropathic pain she requests oxycodone. HPI  Assessment & Plan: Visit Diagnoses:  1. 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 Diabetic ulcer of right heel associated with type 2 diabetes mellitus, with muscle involvement without evidence of necrosis (HCC)   3. Osteomyelitis of ankle or foot (HCC)   4. Right foot ulcer, limited to breakdown of skin (HCC)     Plan: We'll increase her Neurontin to 600 mg 3 times a day prescription was called in discussed the importance of not using narcotic for her neuropathic pain. She will continue with the silver alginate dressing changes to the right foot currently has extra-depth shoes custom orthotics and double upright brace on the right. Continue with the PRA AFO on the left. We will harvest the sutures today left heel incision. Right foot ulcer debrided of skin and soft tissue.  Follow-Up Instructions: Return in about 3 weeks (around 06/18/2016).   Ortho Exam On examination patient is alert oriented no adenopathy well-dressed normal affect normal respiratory effort she had was in a wheelchair. Examination the left heel surgical incision has gaped open a little bit the wound is flat there is no drainage no cellulitis no signs of infection. We will  harvest the sutures today. Examination the right heel she has a chronic mid foot ulcer on the right with easy granulation tissue. This was probed and did not probe down to bone or tendon. After informed consent a 10 blade knife was used to debride the skin and soft tissue back to bleeding viable granulation tissue. Silver nitrate was used for hemostasis a dry 4 x 4 dressing was applied. Ulcer measures 4 cm in diameter and 5 mm deep after debridement.  Imaging: No results found.  Orders:  No orders of the defined types were placed in this encounter.  Meds ordered this encounter  Medications  . gabapentin (NEURONTIN) 300 MG capsule    Sig: Take 2 capsules (600 mg total) by mouth 3 (three) times daily.    Dispense:  180 capsule    Refill:  3     Procedures: No procedures performed  Clinical Data: No additional findings.  Subjective: Review of Systems  Objective: Vital Signs: There were no vitals taken for this visit.  Specialty Comments:  No specialty comments available.  PMFS History: Patient Active Problem List   Diagnosis Date Noted  . Diabetic ulcer of right heel associated with type 2 diabetes mellitus (HCC) 05/12/2016  . Osteomyelitis of ankle or foot (HCC)   . 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  Right foot ulcer, limited to breakdown of skin (HCC) 04/26/2016  . Hypertension 04/26/2016  . DM (diabetes mellitus) type II controlled with renal manifestation (HCC) 04/26/2016  . Fibromyalgia 04/26/2016  . Diabetic polyneuropathy (HCC) 04/26/2016   Past Medical History:  Diagnosis Date  . Anxiety   . Arthritis    "everywhere" (04/26/2016)  . Childhood asthma   . Chronic lower back pain   . Claustrophobia   . Complication of anesthesia    "had metallic taste in my mouth for a year after one of my ORs; next OR they changed some stuff; no problems since" (04/26/2016)  .  Depression   . Diabetic polyneuropathy (HCC)   . Family history of adverse reaction to anesthesia    "mom; don't have an idea what it was" (04/26/2016)  . Fibromyalgia   . GERD (gastroesophageal reflux disease)   . Headache    "related to stress" (04/26/2016)  . Hypertension   . Manic, depressive (HCC)    "severe" (04/26/2016)  . Pneumonia    "long time ago" (04/26/2016)  . Sickle cell trait (HCC)   . Sleep apnea    "mask ordered; too claustrophobic to wear" (04/26/2016)  . Type II diabetes mellitus (HCC)     No family history on file.  Past Surgical History:  Procedure Laterality Date  . ABDOMINAL HYSTERECTOMY     "partial"  . APPENDECTOMY    . CALCANEAL OSTEOTOMY Left 04/28/2016   Procedure: CALCANEAL OSTEOTOMY;  Surgeon: Nadara MustardMarcus V Shaneal Barasch, MD;  [facility];  Service: Orthopedics;  Laterality: Left;  . CATARACT EXTRACTION W/ INTRAOCULAR LENS  IMPLANT, BILATERAL Bilateral   . FOOT SURGERY Left 2015  . FOOT SURGERY Right 04/2014   subtalar joint arthrodesis, talonavicualr joint arthrodesis, hammertoe repair 2,4,5 right foot  . FRACTURE SURGERY    . LAPAROSCOPIC CHOLECYSTECTOMY    . OPEN REDUCTION INTERNAL FIXATION (ORIF) TIBIA/FIBULA FRACTURE Left   . TOE AMPUTATION Bilateral    toe hallux   . TONSILLECTOMY AND ADENOIDECTOMY     Social History   Occupational History  . Not on file.   Social History Main Topics  . Smoking status: Never Smoker  . Smokeless tobacco: Never Used  . Alcohol use No  . Drug use: No  . Sexual activity: No

## 2016-05-31 ENCOUNTER — Ambulatory Visit: Payer: Medicare PPO | Attending: Internal Medicine | Admitting: Internal Medicine

## 2016-05-31 ENCOUNTER — Encounter: Payer: Self-pay | Admitting: Licensed Clinical Social Worker

## 2016-05-31 ENCOUNTER — Encounter: Payer: Self-pay | Admitting: Internal Medicine

## 2016-05-31 ENCOUNTER — Telehealth: Payer: Self-pay | Admitting: Internal Medicine

## 2016-05-31 VITALS — BP 146/83 | HR 91 | Temp 98.2°F | Resp 16

## 2016-05-31 DIAGNOSIS — K5900 Constipation, unspecified: Secondary | ICD-10-CM | POA: Diagnosis not present

## 2016-05-31 DIAGNOSIS — Z794 Long term (current) use of insulin: Secondary | ICD-10-CM

## 2016-05-31 DIAGNOSIS — M797 Fibromyalgia: Secondary | ICD-10-CM | POA: Diagnosis not present

## 2016-05-31 DIAGNOSIS — Z79899 Other long term (current) drug therapy: Secondary | ICD-10-CM | POA: Diagnosis not present

## 2016-05-31 DIAGNOSIS — I10 Essential (primary) hypertension: Secondary | ICD-10-CM | POA: Diagnosis not present

## 2016-05-31 DIAGNOSIS — E1129 Type 2 diabetes mellitus with other diabetic kidney complication: Secondary | ICD-10-CM | POA: Diagnosis not present

## 2016-05-31 DIAGNOSIS — F331 Major depressive disorder, recurrent, moderate: Secondary | ICD-10-CM

## 2016-05-31 DIAGNOSIS — G4700 Insomnia, unspecified: Secondary | ICD-10-CM

## 2016-05-31 DIAGNOSIS — E11621 Type 2 diabetes mellitus with foot ulcer: Secondary | ICD-10-CM | POA: Diagnosis not present

## 2016-05-31 DIAGNOSIS — M1990 Unspecified osteoarthritis, unspecified site: Secondary | ICD-10-CM | POA: Diagnosis not present

## 2016-05-31 DIAGNOSIS — E118 Type 2 diabetes mellitus with unspecified complications: Secondary | ICD-10-CM | POA: Diagnosis not present

## 2016-05-31 DIAGNOSIS — Z1239 Encounter for other screening for malignant neoplasm of breast: Secondary | ICD-10-CM

## 2016-05-31 DIAGNOSIS — E875 Hyperkalemia: Secondary | ICD-10-CM

## 2016-05-31 DIAGNOSIS — Z1231 Encounter for screening mammogram for malignant neoplasm of breast: Secondary | ICD-10-CM

## 2016-05-31 DIAGNOSIS — L97426 Non-pressure chronic ulcer of left heel and midfoot with bone involvement without evidence of necrosis: Secondary | ICD-10-CM

## 2016-05-31 DIAGNOSIS — M869 Osteomyelitis, unspecified: Secondary | ICD-10-CM

## 2016-05-31 LAB — POCT URINALYSIS DIPSTICK
Bilirubin, UA: NEGATIVE
Glucose, UA: NEGATIVE
Ketones, UA: NEGATIVE
Leukocytes, UA: NEGATIVE
NITRITE UA: NEGATIVE
PH UA: 5
PROTEIN UA: 100
Spec Grav, UA: 1.01
UROBILINOGEN UA: 0.2

## 2016-05-31 LAB — BASIC METABOLIC PANEL WITH GFR
BUN: 29 mg/dL — AB (ref 7–25)
CHLORIDE: 114 mmol/L — AB (ref 98–110)
CO2: 18 mmol/L — ABNORMAL LOW (ref 20–31)
Calcium: 8.8 mg/dL (ref 8.6–10.4)
Creat: 1.4 mg/dL — ABNORMAL HIGH (ref 0.50–0.99)
GFR, Est African American: 46 mL/min — ABNORMAL LOW (ref >=60)
GFR, Est Non African American: 40 mL/min — ABNORMAL LOW (ref >=60)
Glucose, Bld: 143 mg/dL — ABNORMAL HIGH (ref 65–99)
POTASSIUM: 6.6 mmol/L — AB (ref 3.5–5.3)
Sodium: 139 mmol/L (ref 135–146)

## 2016-05-31 MED ORDER — OXYBUTYNIN CHLORIDE ER 5 MG PO TB24: 5.0000 mg | ORAL_TABLET | Freq: Every day | ORAL | 3 refills | Status: DC

## 2016-05-31 MED ORDER — AMITRIPTYLINE HCL 50 MG PO TABS: 50.0000 mg | ORAL_TABLET | Freq: Every day | ORAL | 2 refills | Status: DC

## 2016-05-31 MED ORDER — LACTULOSE 10 GM/15ML PO SOLN: 30.0000 g | Freq: Every day | ORAL | 2 refills | Status: DC | PRN

## 2016-05-31 NOTE — Telephone Encounter (Signed)
Called by oncall rn for critical k of 6.6, repeated, not hemolyzed per rn.  Asked Oncall RN to call pt to be evaluated in ER. May need ekg, and emergent treatment for hyperkalemia.  I attempted to call pt as well at 1025pm, unable to get through. Left urgent vm on her phn.  I remember she told me today at clinic that she turns off her phone at 7pm to save minutes/batttery life.  Asked her to come in our clinic as soon as it opens 830 in am tomorrow morning as well once she gets this message tomorw am.  Needs repeat labs. Likely Lactulose, ekg.  Further wkup.

## 2016-05-31 NOTE — Patient Instructions (Addendum)
- pt request  get med records - please give her form to fill out to get from her old pcp.    - Diabetes Mellitus and Food It is important for you to manage your blood sugar (glucose) level. Your blood glucose level can be greatly affected by what you eat. Eating healthier foods in the appropriate amounts throughout the day at about the same time each day will help you control your blood glucose level. It can also help slow or prevent worsening of your diabetes mellitus. Healthy eating may even help you improve the level of your blood pressure and reach or maintain a healthy weight. General recommendations for healthful eating and cooking habits include:  Eating meals and snacks regularly. Avoid going long periods of time without eating to lose weight.  Eating a diet that consists mainly of plant-based foods, such as fruits, vegetables, nuts, legumes, and whole grains.  Using low-heat cooking methods, such as baking, instead of high-heat cooking methods, such as deep frying. Work with your dietitian to make sure you understand how to use the Nutrition Facts information on food labels. How can food affect me? Carbohydrates  Carbohydrates affect your blood glucose level more than any other type of food. Your dietitian will help you determine how many carbohydrates to eat at each meal and teach you how to count carbohydrates. Counting carbohydrates is important to keep your blood glucose at a healthy level, especially if you are using insulin or taking certain medicines for diabetes mellitus. Alcohol  Alcohol can cause sudden decreases in blood glucose (hypoglycemia), especially if you use insulin or take certain medicines for diabetes mellitus. Hypoglycemia can be a life-threatening condition. Symptoms of hypoglycemia (sleepiness, dizziness, and disorientation) are similar to symptoms of having too much alcohol. If your health care provider has given you approval to drink alcohol, do so in  moderation and use the following guidelines:  Women should not have more than one drink per day, and men should not have more than two drinks per day. One drink is equal to:  12 oz of beer.  5 oz of wine.  1 oz of hard liquor.  Do not drink on an empty stomach.  Keep yourself hydrated. Have water, diet soda, or unsweetened iced tea.  Regular soda, juice, and other mixers might contain a lot of carbohydrates and should be counted. What foods are not recommended? As you make food choices, it is important to remember that all foods are not the same. Some foods have fewer nutrients per serving than other foods, even though they might have the same number of calories or carbohydrates. It is difficult to get your body what it needs when you eat foods with fewer nutrients. Examples of foods that you should avoid that are high in calories and carbohydrates but low in nutrients include:  Trans fats (most processed foods list trans fats on the Nutrition Facts label).  Regular soda.  Juice.  Candy.  Sweets, such as cake, pie, doughnuts, and cookies.  Fried foods. What foods can I eat? Eat nutrient-rich foods, which will nourish your body and keep you healthy. The food you should eat also will depend on several factors, including:  The calories you need.  The medicines you take.  Your weight.  Your blood glucose level.  Your blood pressure level.  Your cholesterol level. You should eat a variety of foods, including:  Protein.  Lean cuts of meat.  Proteins low in saturated fats, such as fish, egg whites,  and beans. Avoid processed meats.  Fruits and vegetables.  Fruits and vegetables that may help control blood glucose levels, such as apples, mangoes, and yams.  Dairy products.  Choose fat-free or low-fat dairy products, such as milk, yogurt, and cheese.  Grains, bread, pasta, and rice.  Choose whole grain products, such as multigrain bread, whole oats, and brown  rice. These foods may help control blood pressure.  Fats.  Foods containing healthful fats, such as nuts, avocado, olive oil, canola oil, and fish. Does everyone with diabetes mellitus have the same meal plan? Because every person with diabetes mellitus is different, there is not one meal plan that works for everyone. It is very important that you meet with a dietitian who will help you create a meal plan that is just right for you. This information is not intended to replace advice given to you by your health care provider. Make sure you discuss any questions you have with your health care provider. Document Released: 01/20/2005 Document Revised: 10/01/2015 Document Reviewed: 03/22/2013 Elsevier Interactive Patient Education  2017 Elsevier Inc.   -  Low-Sodium Eating Plan Sodium raises blood pressure and causes water to be held in the body. Getting less sodium from food will help lower your blood pressure, reduce any swelling, and protect your heart, liver, and kidneys. We get sodium by adding salt (sodium chloride) to food. Most of our sodium comes from canned, boxed, and frozen foods. Restaurant foods, fast foods, and pizza are also very high in sodium. Even if you take medicine to lower your blood pressure or to reduce fluid in your body, getting less sodium from your food is important. What is my plan? Most people should limit their sodium intake to 2,300 mg a day. Your health care provider recommends that you limit your sodium intake to __________ a day. What do I need to know about this eating plan? For the low-sodium eating plan, you will follow these general guidelines:  Choose foods with a % Daily Value for sodium of less than 5% (as listed on the food label).  Use salt-free seasonings or herbs instead of table salt or sea salt.  Check with your health care provider or pharmacist before using salt substitutes.  Eat fresh foods.  Eat more vegetables and fruits.  Limit canned  vegetables. If you do use them, rinse them well to decrease the sodium.  Limit cheese to 1 oz (28 g) per day.  Eat lower-sodium products, often labeled as "lower sodium" or "no salt added."  Avoid foods that contain monosodium glutamate (MSG). MSG is sometimes added to [place] food and some canned foods.  Check food labels (Nutrition Facts labels) on foods to learn how much sodium is in one serving.  Eat more home-cooked food and less restaurant, buffet, and fast food.  When eating at a restaurant, ask that your food be prepared with less salt, or no salt if possible. How do I read food labels for sodium information? The Nutrition Facts label lists the amount of sodium in one serving of the food. If you eat more than one serving, you must multiply the listed amount of sodium by the number of servings. Food labels may also identify foods as:  Sodium free-Less than 5 mg in a serving.  Very low sodium-35 mg or less in a serving.  Low sodium-140 mg or less in a serving.  Light in sodium-50% less sodium in a serving. For example, if a food that usually has 300 mg of  sodium is changed to become light in sodium, it will have 150 mg of sodium.  Reduced sodium-25% less sodium in a serving. For example, if a food that usually has 400 mg of sodium is changed to reduced sodium, it will have 300 mg of sodium. What foods can I eat? Grains  Low-sodium cereals, including oats, puffed wheat and rice, and shredded wheat cereals. Low-sodium crackers. Unsalted rice and pasta. Lower-sodium bread. Vegetables  Frozen or fresh vegetables. Low-sodium or reduced-sodium canned vegetables. Low-sodium or reduced-sodium tomato sauce and paste. Low-sodium or reduced-sodium tomato and vegetable juices. Fruits  Fresh, frozen, and canned fruit. Fruit juice. Meat and Other Protein Products  Low-sodium canned tuna and salmon. Fresh or frozen meat, poultry, seafood, and fish. Lamb. Unsalted nuts. Dried beans, peas, and  lentils without added salt. Unsalted canned beans. Homemade soups without salt. Eggs. Dairy  Milk. Soy milk. Ricotta cheese. Low-sodium or reduced-sodium cheeses. Yogurt. Condiments  Fresh and dried herbs and spices. Salt-free seasonings. Onion and garlic powders. Low-sodium varieties of mustard and ketchup. Fresh or refrigerated horseradish. Lemon juice. Fats and Oils  Reduced-sodium salad dressings. Unsalted butter. Other  Unsalted popcorn and pretzels. The items listed above may not be a complete list of recommended foods or beverages. Contact your dietitian for more options.  What foods are not recommended? Grains  Instant hot cereals. Bread stuffing, pancake, and biscuit mixes. Croutons. Seasoned rice or pasta mixes. Noodle soup cups. Boxed or frozen macaroni and cheese. Self-rising flour. Regular salted crackers. Vegetables  Regular canned vegetables. Regular canned tomato sauce and paste. Regular tomato and vegetable juices. Frozen vegetables in sauces. Salted [place] fries. Olives. Rosita Fire. Relishes. Sauerkraut. Salsa. Meat and Other Protein Products  Salted, canned, smoked, spiced, or pickled meats, seafood, or fish. Bacon, ham, sausage, hot dogs, corned beef, chipped beef, and packaged luncheon meats. Salt pork. Jerky. Pickled herring. Anchovies, regular canned tuna, and sardines. Salted nuts. Dairy  Processed cheese and cheese spreads. Cheese curds. Blue cheese and cottage cheese. Buttermilk. Condiments  Onion and garlic salt, seasoned salt, table salt, and sea salt. Canned and packaged gravies. Worcestershire sauce. Tartar sauce. Barbecue sauce. Teriyaki sauce. Soy sauce, including reduced sodium. Steak sauce. Fish sauce. Oyster sauce. Cocktail sauce. Horseradish that you find on the shelf. Regular ketchup and mustard. Meat flavorings and tenderizers. Bouillon cubes. Hot sauce. Tabasco sauce. Marinades. Taco seasonings. Relishes. Fats and Oils  Regular salad dressings. Salted butter.  Margarine. Ghee. Bacon fat. Other  Potato and tortilla chips. Corn chips and puffs. Salted popcorn and pretzels. Canned or dried soups. Pizza. Frozen entrees and pot pies. The items listed above may not be a complete list of foods and beverages to avoid. Contact your dietitian for more information.  This information is not intended to replace advice given to you by your health care provider. Make sure you discuss any questions you have with your health care provider. Document Released: 10/15/2001 Document Revised: 10/01/2015 Document Reviewed: 02/27/2013 Elsevier Interactive Patient Education  2017 Elsevier Inc.   -  Hypertension Hypertension is another name for high blood pressure. High blood pressure forces your heart to work harder to pump blood. A blood pressure reading has two numbers, which includes a higher number over a lower number (example: 110/72). Follow these instructions at home:  Have your blood pressure rechecked by your doctor.  Only take medicine as told by your doctor. Follow the directions carefully. The medicine does not work as well if you skip doses. Skipping doses also puts you at  risk for problems.  Do not smoke.  Monitor your blood pressure at home as told by your doctor. Contact a doctor if:  You think you are having a reaction to the medicine you are taking.  You have repeat headaches or feel dizzy.  You have puffiness (swelling) in your ankles.  You have trouble with your vision. Get help right away if:  You get a very bad headache and are confused.  You feel weak, numb, or faint.  You get chest or belly (abdominal) pain.  You throw up (vomit).  You cannot breathe very well. This information is not intended to replace advice given to you by your health care provider. Make sure you discuss any questions you have with your health care provider. Document Released: 10/12/2007 Document Revised: 10/01/2015 Document Reviewed: 02/15/2013 Elsevier  Interactive Patient Education  2017 Elsevier Inc.  -  Generalized Anxiety Disorder Generalized anxiety disorder (GAD) is a mental disorder. It interferes with life functions, including relationships, work, and school. GAD is different from normal anxiety, which everyone experiences at some point in their lives in response to specific life events and activities. Normal anxiety actually helps [place] prepare for and get through these life events and activities. Normal anxiety goes away after the event or activity is over.  GAD causes anxiety that is not necessarily related to specific events or activities. It also causes excess anxiety in proportion to specific events or activities. The anxiety associated with GAD is also difficult to control. GAD can vary from mild to severe. People with severe GAD can have intense waves of anxiety with physical symptoms (panic attacks).  SYMPTOMS The anxiety and worry associated with GAD are difficult to control. This anxiety and worry are related to many life events and activities and also occur more days than not for 6 months or longer. People with GAD also have three or more of the following symptoms (one or more in children):  Restlessness.   Fatigue.  Difficulty concentrating.   Irritability.  Muscle tension.  Difficulty sleeping or unsatisfying sleep. DIAGNOSIS GAD is diagnosed through an assessment by your health care provider. Your health care provider will ask you questions aboutyour mood,physical symptoms, and events in your life. Your health care provider may ask you about your medical history and use of alcohol or drugs, including prescription medicines. Your health care provider may also do a physical exam and blood tests. Certain medical conditions and the use of certain substances can cause symptoms similar to those associated with GAD. Your health care provider may refer you to a mental health specialist for further evaluation. TREATMENT The  following therapies are usually used to treat GAD:   Medication. Antidepressant medication usually is prescribed for long-term daily control. Antianxiety medicines may be added in severe cases, especially when panic attacks occur.   Talk therapy (psychotherapy). Certain types of talk therapy can be helpful in treating GAD by providing support, education, and guidance. A form of talk therapy called cognitive behavioral therapy can teach you healthy ways to think about and react to daily life events and activities.  Stress managementtechniques. These include yoga, meditation, and exercise and can be very helpful when they are practiced regularly. A mental health specialist can help determine which treatment is best for you. Some people see improvement with one therapy. However, other people require a combination of therapies. This information is not intended to replace advice given to you by your health care provider. Make sure you discuss any questions you  have with your health care provider. Document Released: 08/20/2012 Document Revised: 05/16/2014 Document Reviewed: 08/20/2012 Elsevier Interactive Patient Education  2017 [employer].  -

## 2016-05-31 NOTE — Progress Notes (Signed)
Diane [name], is a 66 y.o. female  ZOX:[id number]  WJX:[id number]  DOB - 1951/04/19  CC:  Chief Complaint  Patient presents with  . Referral  . Anxiety       HPI: Diane [name] is a 67 y.o. female here today to establish medical care, w/ significant hx of dm, osteomyelitis left calcaneous sp recent surgical debridement, "arthritis", MDD/depression/anxiety.  She states at one time also had dx of Bipolar as well.    Recently saw PA on 05/13/15. Of note, pt recently relocated here for [facility], [place], and was recently hospitalized 04/26/2016-04/29/2016 for osteomyelitis of the L calcaneus with surgical debridement.  She recently saw Dr Lajoyce Corners ortho on 05/29/15 and was instructed gabapentin 600mg  tid for neuropathic pain.  Was told the importance of not being on narcotics for neuropathic pain.    Of note, for her MDD/anxiety/depression, possible bipolar, she has tried numerous rx. She stopped taking seroquel b/c it did not work for her.  She has tried welbutrin, buspar, prozac, lexapro and celexa w/o improvement. Lithium actually helped to steady her mood some.  Denies si/hi/avh, but very angry about her current health and living conditions. Wants to move again in next 6months once things are more stable for her health. Apparently, she blames her move here all based on "lies."  Co of constipation, use to be on lactulose, which helped greatly. She is currently in wheelchair due to her immobility.  Patient has No headache, No chest pain, No abdominal pain - No Nausea, No new weakness tingling or numbness, No Cough - SOB.    Review of Systems: Per hpi, o/w all systems reviewed and negative.    Allergies  Allergen Reactions  . Erythromycin Anaphylaxis  . Biaxin [Clarithromycin] Other (See Comments)  . Feldene [Piroxicam] Diarrhea, Nausea And Vomiting and Other (See Comments)    And sores in mouth  . Lasix [Furosemide] Other (See Comments)    Sores all over body  . Nsaids Other  (See Comments)    Contraindicated because of her other meds  . Tetracyclines & Related   . Latex Rash  . Penicillins Rash and Other (See Comments)    Has patient had a PCN reaction causing immediate rash, facial/tongue/throat swelling, SOB or lightheadedness with hypotension: Yes Has patient had a PCN reaction causing severe rash involving mucus membranes or skin necrosis: No Has patient had a PCN reaction that required hospitalization No Has patient had a PCN reaction occurring within the last 10 years: No If all of the above answers are "NO", then may proceed with Cephalosporin use. And sores in her mouth   . Tape Rash   Past Medical History:  Diagnosis Date  . Anxiety   . Arthritis    "everywhere" (04/26/2016)  . Childhood asthma   . Chronic lower back pain   . Claustrophobia   . Complication of anesthesia    "had metallic taste in my mouth for a year after one of my ORs; next OR they changed some stuff; no problems since" (04/26/2016)  . Depression   . Diabetic polyneuropathy (HCC)   . Family history of adverse reaction to anesthesia    "mom; don't have an idea what it was" (04/26/2016)  . Fibromyalgia   . GERD (gastroesophageal reflux disease)   . Headache    "related to stress" (04/26/2016)  . Hypertension   . Manic, depressive (HCC)    "severe" (04/26/2016)  . Pneumonia    "long time ago" (04/26/2016)  . Sickle cell  trait (HCC)   . Sleep apnea    "mask ordered; too claustrophobic to wear" (04/26/2016)  . Type II diabetes mellitus (HCC)    Current Outpatient Prescriptions on File Prior to Visit  Medication Sig Dispense Refill  . ALPRAZolam (XANAX) 0.5 MG tablet Take 1 tablet (0.5 mg total) by mouth 3 (three) times daily. Use sparingly as needed for anxiety 40 tablet 0  . donepezil (ARICEPT) 5 MG tablet Take 1 tablet (5 mg total) by mouth at bedtime. 30 tablet 5  . ergocalciferol (VITAMIN D2) 50000 units capsule Take 1 capsule (50,000 Units total) by mouth once a  week. 12 capsule 0  . gabapentin (NEURONTIN) 300 MG capsule Take 2 capsules (600 mg total) by mouth 3 (three) times daily. 180 capsule 3  . HYDROcodone-acetaminophen (NORCO) 10-325 MG tablet Take 2 tablets by mouth every 6 (six) hours as needed for moderate pain.    Marland Kitchen insulin aspart (NOVOLOG) 100 UNIT/ML injection Inject 1 Units into the skin 3 (three) times daily before meals. Sliding scale; 12 units max per day 10 mL 2  . losartan (COZAAR) 25 MG tablet Take 1 tablet (25 mg total) by mouth daily. 90 tablet 1  . oxyCODONE (OXY IR/ROXICODONE) 5 MG immediate release tablet Take 1 tablet (5 mg total) by mouth every 4 (four) hours as needed for breakthrough pain. (Patient not taking: Reported on 05/28/2016) 30 tablet 0  . RABEprazole (ACIPHEX) 20 MG tablet Take 1 tablet (20 mg total) by mouth 2 (two) times daily. 60 tablet 5  . sulfamethoxazole-trimethoprim (BACTRIM DS,SEPTRA DS) 800-160 MG tablet Take 1 tablet by mouth 2 (two) times daily. x1 month 60 tablet 0   No current facility-administered medications on file prior to visit.    No family history on file. Social History   Social History  . Marital status: Single    Spouse name: N/A  . Number of children: N/A  . Years of education: N/A   Occupational History  . Not on file.   Social History Main Topics  . Smoking status: Never Smoker  . Smokeless tobacco: Never Used  . Alcohol use No  . Drug use: No  . Sexual activity: No   Other Topics Concern  . Not on file   Social History Narrative  . No narrative on file    Objective:   Vitals:   05/31/16 1205  BP: (!) 146/83  Pulse: 91  Resp: 16  Temp: 98.2 F (36.8 C)    There were no vitals filed for this visit.  BP Readings from Last 3 Encounters:  05/31/16 (!) 146/83  05/12/16 120/81  05/06/16 165/89    Physical Exam: Constitutional: Patient appears well-developed and well-nourished.  AAOx3, Sitting in wheelchair, limited exam due to this and body habitus. HENT:  Normocephalic, atraumatic, External right and left ear normal. Oropharynx is clear and moist.  Eyes: Conjunctivae and EOM are normal. PERRL, no scleral icterus. Neck: Normal ROM. Neck supple. No JVD.  CVS: RRR, S1/S2 +, no murmurs, no gallops, no carotid bruit.  Pulmonary: Effort and breath sounds normal, no stridor, rhonchi, wheezes, rales.  Abdominal: Soft. BS +, obese, no distension, tenderness, rebound or guarding.  Musculoskeletal: Normal range of motion. No edema and no tenderness.  LE: bilat/ no c/c/e, pulses 2+ bilateral.  Her left foot is in foot boot, right foot no edema noted. Neuro: Alert.  muscle tone coordination wnl. No cranial nerve deficit grossly.  Skin: Skin is warm and dry. No rash noted. Not  diaphoretic. No erythema. No pallor. Psychiatric: Behavior, judgment, thought content normal.  Very angry, speech if very forced and almost manic in my estimation.  Tangential thought processes, poor eye contact. Difficult to get answers to questions on exam.     Lab Results  Component Value Date   WBC 6.1 05/12/2016   HGB 10.6 (L) 05/12/2016   HCT 34.4 (L) 05/12/2016   MCV 79.1 (L) 05/12/2016   PLT 351 05/12/2016   Lab Results  Component Value Date   CREATININE 1.55 (H) 05/12/2016   BUN 25 05/12/2016   NA 139 05/12/2016   K 4.6 05/12/2016   CL 107 05/12/2016   CO2 18 (L) 05/12/2016    Lab Results  Component Value Date   HGBA1C 7.6 (H) 04/26/2016   Lipid Panel  No results found for: CHOL, TRIG, HDL, CHOLHDL, VLDL, LDLCALC      Depression screen [facility] 2/9 05/31/2016 05/12/2016  Decreased Interest 3 3  Down, Depressed, Hopeless 3 3  PHQ - 2 Score 6 6  Altered sleeping 3 3  Tired, decreased energy 3 3  Change in appetite 3 3  Feeling bad or failure about yourself  3 3  Trouble concentrating 3 3  Moving slowly or fidgety/restless 3 2  Suicidal thoughts 3 2  PHQ-9 Score 27 25    Assessment and plan:   1. Essential hypertension slighltty elevated, suspect her  anxiety/anger making it worse., info provided on low salt diet - continue cozaar for now - if remains elevated next time and renal function better, consider switching to hyzaar or increase cozaar to 50 qd - BASIC METABOLIC PANEL WITH GFR  2. 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  aic 7.6 (04/26/16) - may benefit w/ metformin pending renal function labs - only on insulin - Microalbumin/Creatinine Ratio, Urine - Urinalysis Dipstick  3. 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  4. Osteomyelitis of ankle or foot (HCC) Per Dr Lajoyce Corners  5. Fibromyalgia Trial elavil 50mg  qhs to see if helps w/ this and her insomnia, may help somewhat w/ her MDD as well  6. Moderate episode of recurrent major depressive disorder (HCC) - denies si/hi/avh With possible bipolar/mania component as well. She may ultimately benefit most w/ close psychiatry eval and lithium, but defer to psyche. - psyche called pt on 05/16/16, but she never received the call.  I gave pt the nmb noted on referral to call for appt. - trial elavil since she has never been on this prior, recd taking 1/2 tab to see if enough. May not need the full dose. - asked Jasmine, sw to see pt today as well. Appreciate help  7. Constipation, unspecified constipation type Worse w/ sitting in wheelchair I suspect. Lactulose prn added  8. Arthritis Pt requested rheum cs, will hold off until get baseline labs - Rheumatoid factor - ANA - Sedimentation Rate  9. Breast cancer screening - MM Digital Screening; Future   Return in about 3 months (around 08/29/2016), or if symptoms worsen or fail to improve.  The patient was given clear instructions to go to ER or return to medical center  if symptoms don't improve, worsen or new problems develop. The patient verbalized understanding. The patient was told to call to get lab results if they haven't  heard anything in the next week.    This note has been created with Education officer, environmentalDragon speech recognition software and smart phrase technology. Any transcriptional errors are unintentional.   Pete Glatterawn T Keo Schirmer, MD, MBA/MHA [facility] [place], [place] [phone]   05/31/2016, 12:52 PM

## 2016-05-31 NOTE — BH Specialist Note (Signed)
Session Start time: 12:25 PM   End Time: 12:45 PM Total Time:  20 minutes Type of Service: Behavioral Health - Individual/Family Interpreter: No.   Interpreter Name & Language: N/A # Diane [name] July 2017-June 2018: 1st   SUBJECTIVE: Diane [name] is a 66 y.o. female  Pt. was referred by Dr. Julien NordmannLangeland for:  anxiety and depression. Pt. reports the following symptoms/concerns: overwhelming feelings of sadness and worry, difficulty sleeping, low energy, and irritability Duration of problem:  Ongoing Severity: severe Previous treatment: Pt is not receiving behavioral health services.   OBJECTIVE: Mood: Anxious and Irritable & Affect: Depressed Risk of harm to self or others: Pt denied SI/HI Assessments administered: PHQ-9; GAD-7  LIFE CONTEXT:  Family & Social: Pt recently relocated from [place]. She currently resides with her best friend and adult son School/ Work: Pt is unemployed. She receives Medicare Self-Care: Pt has difficulty sleeping and has difficulty obtaining healthy foods. No report of substance use Life changes: Pt relocated from [place] to be closer to her best friend. Pt is experiencing frustration with current living arrangements and would like some independence. What is important to pt/family (values): Family, Friendships, Independence   GOALS ADDRESSED:  Decrease symptoms of depression Decrease symptoms of anxiety  INTERVENTIONS: Solution Focused, Strength-based and Supportive   ASSESSMENT:  Pt currently experiencing depression and anxiety triggered by decline in medical health and decreased independence due to limited mobility. Pt reports overwhelming feelings of sadness and worry, difficulty sleeping, low energy, and irritability. She recently relocated from [place] to reside with her best friend and adult son. Pt may benefit from psychoeducation, psychotherapy and medication management. LCSWA educated pt on the cycle of depression and anxiety and discussed healthy  coping skills to decrease symptoms. Pt verbalized understanding of how mental and physical health correlates to one another, in addition, to the importance of medication compliance to manage diabetes and hypertension. Pt verbalized transportation barriers. LCSWA discussed benefits of SCAT and provided pt with application. Pt was also provided community resources for food insecurity, crisis intervention, therapy, and medication management.       PLAN: 1. F/U with behavioral health clinician: Pt was encouraged to contact LCSWA if symptoms worsen or fail to improve to [facility]. 2. Behavioral Health meds: Xanax, Elavil 3. Behavioral recommendations: LCSWA recommends that pt apply healthy coping skills discussed, complete SCAT application, and utilize community resources, as needed. Pt is encouraged to schedule follow up appointment with LCSWA 4. Referral: Brief Counseling/Psychotherapy, [facility], Problem-solving teaching/coping strategies, Psychoeducation and Supportive Counseling 5. From scale of 1-10, how likely are you to follow plan: 8/10   Bridgett LarssonJasmine D Rushi Chasen, MSW, [facility]SWA  Clinical Social Worker 06/01/16 9:05 AM  Warmhandoff:   Warm Hand Off Completed.

## 2016-06-01 ENCOUNTER — Telehealth: Payer: Self-pay | Admitting: Internal Medicine

## 2016-06-01 LAB — RHEUMATOID FACTOR: Rhuematoid fact SerPl-aCnc: <14 IU/mL (ref <14)

## 2016-06-01 LAB — MICROALBUMIN / CREATININE URINE RATIO
Creatinine, Urine: 82 mg/dL (ref 20–320)
MICROALB/CREAT RATIO: 287 ug/mg{creat} — AB (ref <30)
Microalb, Ur: 23.5 mg/dL

## 2016-06-01 LAB — SEDIMENTATION RATE: SED RATE: 51 mm/h — AB (ref 0–30)

## 2016-06-01 MED ORDER — LACTULOSE 10 GM/15ML PO SOLN: 30.0000 g | Freq: Every day | ORAL | 2 refills | Status: DC | PRN

## 2016-06-01 MED ORDER — INSULIN ASPART 100 UNIT/ML ~~LOC~~ SOLN: 1.0000 [IU] | Freq: Three times a day (TID) | SUBCUTANEOUS | 2 refills | Status: DC

## 2016-06-01 NOTE — Telephone Encounter (Signed)
Called pt, confirmed dob.  She denies any c/o. Notified her of her high k values, discussed with her the dangers of having elevated k, including arrhythmias and danger to her heart.  She needs to go pick up the lactulose rx yesterday.  [facility]  She said she was unable to pick up yesterday.     Instructed her to take lactulose 45ml x tid x 2 days, and come into lab tomorw for repeat labs. Of note, instructed her that if she cannot get a ride to pickup her lactulose and unable to start taking immediately today, she needs to call 911 and get evaluated in ED. She states she understands.  I attempted to call [facility] to asked them to have lacutlose ready, but they will not be open til 9.  Will call again.  [facility] - [place] ([place]), [place] - [address][Patient [id number]][phone]

## 2016-06-01 NOTE — Addendum Note (Signed)
Addended byDierdre Searles: Marine Lezotte T on: 06/01/2016 08:31 AM   Modules accepted: Orders

## 2016-06-01 NOTE — Telephone Encounter (Signed)
Called [facility] - lactulose ready for pickup .  Asked to change rx to 45ml tid x 2 days, and than have her come to lab tomorw to get labs drawn.  Pharmacist stated the lactulose was ready for her.

## 2016-06-02 LAB — ANA: Anti Nuclear Antibody(ANA): NEGATIVE

## 2016-06-06 ENCOUNTER — Other Ambulatory Visit: Payer: Self-pay | Admitting: Internal Medicine

## 2016-06-06 MED ORDER — INSULIN PEN NEEDLE 32G X 4 MM MISC: 1.0000 | Freq: Every day | 2 refills | Status: DC

## 2016-06-06 MED ORDER — INSULIN GLARGINE 100 UNIT/ML SOLOSTAR PEN: 8.0000 [IU] | PEN_INJECTOR | Freq: Every day | SUBCUTANEOUS | 3 refills | Status: DC

## 2016-06-06 NOTE — Progress Notes (Signed)
cma called pt today to come in for repeat bmp, for hyperkalemia. She has no c/o. Trying to get ride to come in.   Of note, received [facility] form, pt use to be on Lantus 14 units bid, but did not tell us on recent clinic visit. Her aic 7.6, goal a1c <7.  Started her on lantus 8units qhs for now, [employer] pen and needle supplies rx via escribe.  Will fax back home health forms.

## 2016-06-07 ENCOUNTER — Other Ambulatory Visit: Payer: Self-pay | Admitting: Internal Medicine

## 2016-06-07 MED ORDER — PRAVASTATIN SODIUM 20 MG PO TABS: 20.0000 mg | ORAL_TABLET | Freq: Every day | ORAL | 3 refills | Status: DC

## 2016-06-17 ENCOUNTER — Telehealth (INDEPENDENT_AMBULATORY_CARE_PROVIDER_SITE_OTHER): Payer: Self-pay | Admitting: Orthopedic Surgery

## 2016-06-17 NOTE — Telephone Encounter (Signed)
Faxed last office dictation

## 2016-06-17 NOTE — Telephone Encounter (Signed)
[facility] REQ PT LAST VISIT NOTE FAXED   [phone] ATTN NICOLE  [phone]

## 2016-06-20 ENCOUNTER — Ambulatory Visit (INDEPENDENT_AMBULATORY_CARE_PROVIDER_SITE_OTHER): Payer: Medicare PPO | Admitting: Orthopedic Surgery

## 2016-06-20 VITALS — Ht 61.0 in | Wt 202.0 lb

## 2016-06-20 DIAGNOSIS — L97426 Non-pressure chronic ulcer of left heel and midfoot with bone involvement without evidence of necrosis: Secondary | ICD-10-CM | POA: Diagnosis not present

## 2016-06-20 DIAGNOSIS — E11621 Type 2 diabetes mellitus with foot ulcer: Secondary | ICD-10-CM

## 2016-06-20 DIAGNOSIS — L97411 Non-pressure chronic ulcer of right heel and midfoot limited to breakdown of skin: Secondary | ICD-10-CM | POA: Diagnosis not present

## 2016-06-20 NOTE — Progress Notes (Signed)
Office Visit Note   Patient: Diane [name]           Date of Birth: October 31, 1950           MRN: [id number] Visit Date: 06/20/2016              Requested by: No referring provider defined for this encounter. PCP: Pete Glatterawn T Langeland, MD  Chief Complaint  Patient presents with  . Left Foot - Wound Check  . Right Foot - Wound Check    HPI: Bilateral foot eval. Pt in a PRAFO on the right and post op on the left.     Assessment & Plan: Visit Diagnoses:  1. 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 Diabetic ulcer of right heel associated with type 2 diabetes mellitus, limited to breakdown of skin (HCC)     Plan: Ulcer on the right heel and right forefoot was debrided of skin and soft tissue with a 10 blade knife. These are healing nicely these were tensioned silver nitrate she will continue with Silvadene or by 4 Kerlix and Coban and wraps to the right leg and is well and the same dressing changes to the left heel partial calcaneal excision. She'll continue wear the PRAFO 24 hours a day.  Follow-Up Instructions: Return in about 3 weeks (around 07/11/2016).   Ortho Exam On examination patient is seen for 2 separate issues. Examination of right foot the midfoot ulcer is healing nicely after informed consent a 10 blade knife was used to debride the skin and soft tissue around the wound the wound is 30 mm in diameter and 3 mm deep after debridement this was touched with silver nitrate. She also has a callus and ulcer on the forefoot beneath the third metatarsal head after informed consent this was debrided of skin and soft tissue with a 10 blade knife the ulcer was 10 mm in diameter 1 mm deep. Silvadene plus 4 x 4's plus Kerlix and Coban was applied to the right foot. Left heel ulcer was debrided of callus and nonviable tissue this massive 01 and was almost completely healed there is approximately a 2 x 10  mm granulation tissue will continue wound care as instructed with silver cell Kerlix and Coban and to the left heel.  Imaging: No results found.  Orders:  No orders of the defined types were placed in this encounter.  No orders of the defined types were placed in this encounter.    Procedures: No procedures performed  Clinical Data: No additional findings.  Subjective: Review of Systems  Objective: Vital Signs: Ht 5\' 1"  (1.549 m)   Wt 202 lb (91.6 kg)   BMI 38.17 kg/m   Specialty Comments:  No specialty comments available.  PMFS History: Patient Active Problem List   Diagnosis Date Noted  . Diabetic ulcer of right heel associated with type 2 diabetes mellitus (HCC) 05/12/2016  . Osteomyelitis of ankle or foot (HCC)   . 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 Right foot ulcer, limited to breakdown of skin (HCC) 04/26/2016  . Hypertension 04/26/2016  . DM (diabetes mellitus) type II controlled with renal manifestation (HCC) 04/26/2016  . Fibromyalgia 04/26/2016  . Diabetic polyneuropathy (HCC) 04/26/2016   Past Medical History:  Diagnosis Date  . Anxiety   . Arthritis    "everywhere" (  04/26/2016)  . Childhood asthma   . Chronic lower back pain   . Claustrophobia   . Complication of anesthesia    "had metallic taste in my mouth for a year after one of my ORs; next OR they changed some stuff; no problems since" (04/26/2016)  . Depression   . Diabetic polyneuropathy (HCC)   . Family history of adverse reaction to anesthesia    "mom; don't have an idea what it was" (04/26/2016)  . Fibromyalgia   . GERD (gastroesophageal reflux disease)   . Headache    "related to stress" (04/26/2016)  . Hypertension   . Manic, depressive (HCC)    "severe" (04/26/2016)  . Pneumonia    "long time ago" (04/26/2016)  . Sickle cell trait (HCC)   . Sleep apnea    "mask ordered; too  claustrophobic to wear" (04/26/2016)  . Type II diabetes mellitus (HCC)     No family history on file.  Past Surgical History:  Procedure Laterality Date  . ABDOMINAL HYSTERECTOMY     "partial"  . APPENDECTOMY    . CALCANEAL OSTEOTOMY Left 04/28/2016   Procedure: CALCANEAL OSTEOTOMY;  Surgeon: Nadara Mustard, MD;  Location: [facility] OR;  Service: Orthopedics;  Laterality: Left;  . CATARACT EXTRACTION W/ INTRAOCULAR LENS  IMPLANT, BILATERAL Bilateral   . FOOT SURGERY Left 2015  . FOOT SURGERY Right 04/2014   subtalar joint arthrodesis, talonavicualr joint arthrodesis, hammertoe repair 2,4,5 right foot  . FRACTURE SURGERY    . LAPAROSCOPIC CHOLECYSTECTOMY    . OPEN REDUCTION INTERNAL FIXATION (ORIF) TIBIA/FIBULA FRACTURE Left   . TOE AMPUTATION Bilateral    toe hallux   . TONSILLECTOMY AND ADENOIDECTOMY     Social History   Occupational History  . Not on file.   Social History Main Topics  . Smoking status: Never Smoker  . Smokeless tobacco: Never Used  . Alcohol use No  . Drug use: No  . Sexual activity: No

## 2016-06-23 ENCOUNTER — Telehealth: Payer: Self-pay | Admitting: Internal Medicine

## 2016-06-23 NOTE — Telephone Encounter (Signed)
Caller calling for verbal orders regarding pt. Needs recert for wound care for left and right foot. Caller states that if she does not answer please leave a message. Please f/u.

## 2016-06-23 NOTE — Telephone Encounter (Signed)
Contacted Carrie Epps to give verbal order for re certification. Gave the okay

## 2016-06-27 ENCOUNTER — Telehealth: Payer: Self-pay | Admitting: Internal Medicine

## 2016-06-27 ENCOUNTER — Telehealth (INDEPENDENT_AMBULATORY_CARE_PROVIDER_SITE_OTHER): Payer: Self-pay | Admitting: Orthopedic Surgery

## 2016-06-27 NOTE — Telephone Encounter (Signed)
Patient called the office to speak with PCP in regards to getting a referral to see a psychiatrist for her depression, an arthritis specialist and a dentist. Please follow up.   Thank you.

## 2016-06-27 NOTE — Telephone Encounter (Signed)
Diane LankJacqueline needs a verbal order 1 week 2. CB # [phone]

## 2016-06-27 NOTE — Telephone Encounter (Signed)
Will forward to covering provider.

## 2016-06-28 NOTE — Telephone Encounter (Signed)
I called and left voicemail to give verbal for home health orders.

## 2016-06-28 NOTE — Telephone Encounter (Signed)
Ok. Thank you.

## 2016-07-08 ENCOUNTER — Telehealth (INDEPENDENT_AMBULATORY_CARE_PROVIDER_SITE_OTHER): Payer: Self-pay | Admitting: *Deleted

## 2016-07-08 NOTE — Telephone Encounter (Signed)
ERROR

## 2016-07-11 ENCOUNTER — Encounter (INDEPENDENT_AMBULATORY_CARE_PROVIDER_SITE_OTHER): Payer: Self-pay | Admitting: Orthopedic Surgery

## 2016-07-11 ENCOUNTER — Telehealth (INDEPENDENT_AMBULATORY_CARE_PROVIDER_SITE_OTHER): Payer: Self-pay | Admitting: Orthopedic Surgery

## 2016-07-11 ENCOUNTER — Ambulatory Visit (INDEPENDENT_AMBULATORY_CARE_PROVIDER_SITE_OTHER): Payer: Medicare PPO | Admitting: Orthopedic Surgery

## 2016-07-11 DIAGNOSIS — L97426 Non-pressure chronic ulcer of left heel and midfoot with bone involvement without evidence of necrosis: Secondary | ICD-10-CM | POA: Diagnosis not present

## 2016-07-11 DIAGNOSIS — E11621 Type 2 diabetes mellitus with foot ulcer: Secondary | ICD-10-CM

## 2016-07-11 DIAGNOSIS — L97411 Non-pressure chronic ulcer of right heel and midfoot limited to breakdown of skin: Secondary | ICD-10-CM | POA: Diagnosis not present

## 2016-07-11 NOTE — Telephone Encounter (Signed)
Patient wants order for Home health P.T. for her feet. As was discussed in  January office visit. [facility]

## 2016-07-11 NOTE — Telephone Encounter (Signed)
Please scheduled an appointment for multiple referrals

## 2016-07-11 NOTE — Telephone Encounter (Signed)
Patient should be scheduled to see Dr. Julien NordmannLangeland for referral request.

## 2016-07-11 NOTE — Progress Notes (Signed)
Office Visit Note   Patient: Diane [name]           Date of Birth: [birth year]           MRN: [id number] Visit Date: 07/11/2016              Requested by: Pete Glatterawn T Langeland, MD [address] [place], [place] [address] PCP: Pete Glatterawn T Langeland, MD  Chief Complaint  Patient presents with  . Right Foot - Follow-up  . Left Foot - Follow-up    HPI: Patient is a 66 y.o female who presents for bilateral lower extremities. Patient left foot is healed she continues wearing dry dressing and PRAFO boot. She has right plantar wound. She is applying silver alginate dressing with double upright bracing. There is minimal bloody drainage. There is no odor. She ambulates with walker. Donalee CitrinStepheney L Peele, RT    Assessment & Plan: Visit Diagnoses:  1. 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 Diabetic ulcer of right heel associated with type 2 diabetes mellitus, limited to breakdown of skin (HCC)     Plan: Patient will continue with the protection bilaterally. She will advance to her custom orthotics extra depth shoes and double upright brace's bilaterally. Patient had her paperwork completed for transportation patient is unable to walk for long periods of time is unable to walk to a bus stop and will need transportation can come to her home.  We'll obtain radiographs of both hips at follow-up. Patient is been having chronic hip pain bilaterally.  Follow-Up Instructions: Return in about 3 weeks (around 08/01/2016).   Ortho Exam On examination patient is alert oriented no adenopathy well-dressed normal affect normal respiratory effort she has an antalgic gait using a rolling walker. Examination of both feet she has venous stasis swelling bilaterally but no open ulcers importance of wearing her compression socks was discussed. Patient has completely healed her ulcer on the plantar aspect left foot she will advance  to a regular shoe wear there is no redness no cellulitis no signs of infection she does have Charcot collapse through the midfoot with deformity to the foot and no evidence of active Charcot process infection or ulceration. Examination the right foot the ulcer is healing quite nicely it's proximally 8 mm in diameter 2 mm deep. I recommended debriding the callus around the wound the patient did not want to proceed with debridement at this time she has callus in the forefoot as well this was also not debrided as per her request. Patient has no redness no cellulitis no odor no signs of infection. After gentle debridement with gauze there was good bleeding granulation tissue over the entire wound bed this does not probe to bone or tendon. ROS: Complete review of systems is negative except as noted in the history of present illness. Imaging: No results found.  Labs: Lab Results  Component Value Date   HGBA1C 7.6 (H) 04/26/2016   ESRSEDRATE 51 (H) 05/31/2016   ESRSEDRATE 116 (H) 04/26/2016   REPTSTATUS 05/01/2016 FINAL 04/26/2016   CULT NO GROWTH 5 DAYS 04/26/2016    Orders:  No orders of the defined types were placed in this encounter.  No orders of the defined types were placed in this encounter.    Procedures: No procedures performed  Clinical Data: No additional findings.  Subjective: Review of Systems  Objective: Vital Signs: There were no vitals taken for this visit.  Specialty Comments:  No specialty comments available.  PMFS History: Patient Active Problem List   Diagnosis Date Noted  . Diabetic ulcer of right heel associated with type 2 diabetes mellitus (HCC) 05/12/2016  . Osteomyelitis of ankle or foot (HCC)   . 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 Right foot ulcer, limited to breakdown of skin (HCC) 04/26/2016  . Hypertension 04/26/2016  . DM (diabetes mellitus) type II  controlled with renal manifestation (HCC) 04/26/2016  . Fibromyalgia 04/26/2016  . Diabetic polyneuropathy (HCC) 04/26/2016   Past Medical History:  Diagnosis Date  . Anxiety   . Arthritis    "everywhere" (04/26/2016)  . Childhood asthma   . Chronic lower back pain   . Claustrophobia   . Complication of anesthesia    "had metallic taste in my mouth for a year after one of my ORs; next OR they changed some stuff; no problems since" (04/26/2016)  . Depression   . Diabetic polyneuropathy (HCC)   . Family history of adverse reaction to anesthesia    "mom; don't have an idea what it was" (04/26/2016)  . Fibromyalgia   . GERD (gastroesophageal reflux disease)   . Headache    "related to stress" (04/26/2016)  . Hypertension   . Manic, depressive (HCC)    "severe" (04/26/2016)  . Pneumonia    "long time ago" (04/26/2016)  . Sickle cell trait (HCC)   . Sleep apnea    "mask ordered; too claustrophobic to wear" (04/26/2016)  . Type II diabetes mellitus (HCC)     History reviewed. No pertinent family history.  Past Surgical History:  Procedure Laterality Date  . ABDOMINAL HYSTERECTOMY     "partial"  . APPENDECTOMY    . CALCANEAL OSTEOTOMY Left 04/28/2016   Procedure: CALCANEAL OSTEOTOMY;  Surgeon: Nadara Mustard, MD;  Location: [facility] OR;  Service: Orthopedics;  Laterality: Left;  . CATARACT EXTRACTION W/ INTRAOCULAR LENS  IMPLANT, BILATERAL Bilateral   . FOOT SURGERY Left 2015  . FOOT SURGERY Right 04/2014   subtalar joint arthrodesis, talonavicualr joint arthrodesis, hammertoe repair 2,4,5 right foot  . FRACTURE SURGERY    . LAPAROSCOPIC CHOLECYSTECTOMY    . OPEN REDUCTION INTERNAL FIXATION (ORIF) TIBIA/FIBULA FRACTURE Left   . TOE AMPUTATION Bilateral    toe hallux   . TONSILLECTOMY AND ADENOIDECTOMY     Social History   Occupational History  . Not on file.   Social History Main Topics  . Smoking status: Never Smoker  . Smokeless tobacco: Never Used  . Alcohol use No  .  Drug use: No  . Sexual activity: No

## 2016-07-12 ENCOUNTER — Encounter: Payer: Self-pay | Admitting: Internal Medicine

## 2016-07-12 ENCOUNTER — Telehealth: Payer: Self-pay

## 2016-07-12 ENCOUNTER — Ambulatory Visit: Payer: Medicare PPO | Attending: Internal Medicine | Admitting: Internal Medicine

## 2016-07-12 VITALS — BP 141/90 | HR 72 | Temp 98.1°F | Resp 16 | Wt 202.6 lb

## 2016-07-12 DIAGNOSIS — K219 Gastro-esophageal reflux disease without esophagitis: Secondary | ICD-10-CM | POA: Diagnosis not present

## 2016-07-12 DIAGNOSIS — Z5189 Encounter for other specified aftercare: Secondary | ICD-10-CM | POA: Diagnosis present

## 2016-07-12 DIAGNOSIS — Z1321 Encounter for screening for nutritional disorder: Secondary | ICD-10-CM | POA: Diagnosis not present

## 2016-07-12 DIAGNOSIS — Z794 Long term (current) use of insulin: Secondary | ICD-10-CM | POA: Diagnosis not present

## 2016-07-12 DIAGNOSIS — N183 Chronic kidney disease, stage 3 (moderate): Secondary | ICD-10-CM | POA: Diagnosis not present

## 2016-07-12 DIAGNOSIS — Z1211 Encounter for screening for malignant neoplasm of colon: Secondary | ICD-10-CM

## 2016-07-12 DIAGNOSIS — M545 Low back pain: Secondary | ICD-10-CM | POA: Insufficient documentation

## 2016-07-12 DIAGNOSIS — Z1329 Encounter for screening for other suspected endocrine disorder: Secondary | ICD-10-CM

## 2016-07-12 DIAGNOSIS — I10 Essential (primary) hypertension: Secondary | ICD-10-CM | POA: Diagnosis not present

## 2016-07-12 DIAGNOSIS — Z79899 Other long term (current) drug therapy: Secondary | ICD-10-CM | POA: Insufficient documentation

## 2016-07-12 DIAGNOSIS — Z880 Allergy status to penicillin: Secondary | ICD-10-CM | POA: Insufficient documentation

## 2016-07-12 DIAGNOSIS — E1122 Type 2 diabetes mellitus with diabetic chronic kidney disease: Secondary | ICD-10-CM | POA: Insufficient documentation

## 2016-07-12 DIAGNOSIS — D649 Anemia, unspecified: Secondary | ICD-10-CM | POA: Diagnosis not present

## 2016-07-12 DIAGNOSIS — F410 Panic disorder [episodic paroxysmal anxiety] without agoraphobia: Secondary | ICD-10-CM | POA: Insufficient documentation

## 2016-07-12 DIAGNOSIS — D573 Sickle-cell trait: Secondary | ICD-10-CM | POA: Diagnosis not present

## 2016-07-12 DIAGNOSIS — G8929 Other chronic pain: Secondary | ICD-10-CM | POA: Insufficient documentation

## 2016-07-12 DIAGNOSIS — I129 Hypertensive chronic kidney disease with stage 1 through stage 4 chronic kidney disease, or unspecified chronic kidney disease: Secondary | ICD-10-CM | POA: Diagnosis not present

## 2016-07-12 DIAGNOSIS — E1142 Type 2 diabetes mellitus with diabetic polyneuropathy: Secondary | ICD-10-CM | POA: Diagnosis not present

## 2016-07-12 DIAGNOSIS — Z1239 Encounter for other screening for malignant neoplasm of breast: Secondary | ICD-10-CM

## 2016-07-12 DIAGNOSIS — Z79891 Long term (current) use of opiate analgesic: Secondary | ICD-10-CM | POA: Insufficient documentation

## 2016-07-12 DIAGNOSIS — L97511 Non-pressure chronic ulcer of other part of right foot limited to breakdown of skin: Secondary | ICD-10-CM

## 2016-07-12 DIAGNOSIS — L97518 Non-pressure chronic ulcer of other part of right foot with other specified severity: Secondary | ICD-10-CM | POA: Diagnosis not present

## 2016-07-12 DIAGNOSIS — Z1231 Encounter for screening mammogram for malignant neoplasm of breast: Secondary | ICD-10-CM

## 2016-07-12 LAB — CBC WITH DIFFERENTIAL/PLATELET
BASOS PCT: 0 %
Basophils Absolute: 0 cells/uL (ref 0–200)
EOS PCT: 4 %
Eosinophils Absolute: 488 cells/uL (ref 15–500)
HCT: 34.3 % — ABNORMAL LOW (ref 35.0–45.0)
Hemoglobin: 10.7 g/dL — ABNORMAL LOW (ref 11.7–15.5)
LYMPHS PCT: 26 %
Lymphs Abs: 3172 cells/uL (ref 850–3900)
MCH: 25.1 pg — ABNORMAL LOW (ref 27.0–33.0)
MCHC: 31.2 g/dL — AB (ref 32.0–36.0)
MCV: 80.5 fL (ref 80.0–100.0)
MONO ABS: 732 {cells}/uL (ref 200–950)
MPV: 9.6 fL (ref 7.5–12.5)
Monocytes Relative: 6 %
Neutro Abs: 7808 cells/uL — ABNORMAL HIGH (ref 1500–7800)
Neutrophils Relative %: 64 %
PLATELETS: 275 10*3/uL (ref 140–400)
RBC: 4.26 MIL/uL (ref 3.80–5.10)
RDW: 17.8 % — AB (ref 11.0–15.0)
WBC: 12.2 10*3/uL — AB (ref 3.8–10.8)

## 2016-07-12 LAB — POCT GLYCOSYLATED HEMOGLOBIN (HGB A1C): HEMOGLOBIN A1C: 6.4

## 2016-07-12 LAB — GLUCOSE, POCT (MANUAL RESULT ENTRY): POC GLUCOSE: 166 mg/dL — AB (ref 70–99)

## 2016-07-12 LAB — BASIC METABOLIC PANEL WITH GFR
BUN: 25 mg/dL (ref 7–25)
CHLORIDE: 105 mmol/L (ref 98–110)
CO2: 24 mmol/L (ref 20–31)
CREATININE: 1.57 mg/dL — AB (ref 0.50–0.99)
Calcium: 8.7 mg/dL (ref 8.6–10.4)
GFR, EST AFRICAN AMERICAN: 40 mL/min — AB (ref >=60)
GFR, Est Non African American: 35 mL/min — ABNORMAL LOW (ref >=60)
Glucose, Bld: 147 mg/dL — ABNORMAL HIGH (ref 65–99)
Potassium: 3.9 mmol/L (ref 3.5–5.3)
SODIUM: 140 mmol/L (ref 135–146)

## 2016-07-12 LAB — LIPID PANEL
Cholesterol: 176 mg/dL (ref <200)
HDL: 39 mg/dL — ABNORMAL LOW (ref >50)
LDL CALC: 90 mg/dL (ref <100)
Total CHOL/HDL Ratio: 4.5 Ratio (ref <5.0)
Triglycerides: 237 mg/dL — ABNORMAL HIGH (ref <150)
VLDL: 47 mg/dL — AB (ref <30)

## 2016-07-12 LAB — TSH: TSH: 2.86 mIU/L

## 2016-07-12 MED ORDER — LOSARTAN POTASSIUM 25 MG PO TABS: 25.0000 mg | ORAL_TABLET | Freq: Every day | ORAL | 2 refills | Status: DC

## 2016-07-12 MED ORDER — LACTULOSE 10 GM/15ML PO SOLN: 30.0000 g | Freq: Every day | ORAL | 2 refills | Status: DC | PRN

## 2016-07-12 MED ORDER — HYDROXYZINE HCL 10 MG PO TABS: 10.0000 mg | ORAL_TABLET | Freq: Three times a day (TID) | ORAL | 1 refills | Status: DC | PRN

## 2016-07-12 MED ORDER — FAMOTIDINE 20 MG PO TABS: 20.0000 mg | ORAL_TABLET | Freq: Two times a day (BID) | ORAL | 3 refills | Status: DC

## 2016-07-12 MED ORDER — OXYBUTYNIN CHLORIDE ER 10 MG PO TB24: 10.0000 mg | ORAL_TABLET | Freq: Every day | ORAL | 3 refills | Status: DC

## 2016-07-12 NOTE — Telephone Encounter (Signed)
Referral for home health services received from Dr Julien NordmannLangeland. Attempted to contact the patient to inquire if she has a preference for home health agencies. Call placed to # [phone] and a HIPAA compliant voicemail message was left requesting a call back to # [phone] or [phone]. Call also placed to # [phone] and the message was a long buzz, no option to leave a message.

## 2016-07-12 NOTE — Patient Instructions (Addendum)
Diane [name] 2 wks for RN bp check  Need to get Mammogram  - breast cancer screening, ordered Need to get colonoscopy - colon cancer screening, referral placed to Gastroenterology (GI)  Followup with eye doctor - referral placed today as well.   - should be taking daily calcium supplements - 1221m /day Vit d 5,000 IU /day  Diabetes Mellitus and Food It is important for you to manage your blood sugar (glucose) level. Your blood glucose level can be greatly affected by what you eat. Eating healthier foods in the appropriate amounts throughout the day at about the same time each day will help you control your blood glucose level. It can also help slow or prevent worsening of your diabetes mellitus. Healthy eating may even help you improve the level of your blood pressure and reach or maintain a healthy weight. General recommendations for healthful eating and cooking habits include:  Eating meals and snacks regularly. Avoid going long periods of time without eating to lose weight.  Eating a diet that consists mainly of plant-based foods, such as fruits, vegetables, nuts, legumes, and whole grains.  Using low-heat cooking methods, such as baking, instead of high-heat cooking methods, such as deep frying. Work with your dietitian to make sure you understand how to use the Nutrition Facts information on food labels. How can food affect me? Carbohydrates  Carbohydrates affect your blood glucose level more than any other type of food. Your dietitian will help you determine how many carbohydrates to eat at each meal and teach you how to count carbohydrates. Counting carbohydrates is important to keep your blood glucose at a healthy level, especially if you are using insulin or taking certain medicines for diabetes mellitus. Alcohol  Alcohol can cause sudden decreases in blood glucose (hypoglycemia), especially if you use insulin or take certain medicines for diabetes mellitus. Hypoglycemia can be a  life-threatening condition. Symptoms of hypoglycemia (sleepiness, dizziness, and disorientation) are similar to symptoms of having too much alcohol. If your health care provider has given you approval to drink alcohol, do so in moderation and use the following guidelines:  Women should not have more than one drink per day, and men should not have more than two drinks per day. One drink is equal to:  12 oz of beer.  5 oz of wine.  1 oz of hard liquor.  Do not drink on an empty stomach.  Keep yourself hydrated. Have water, diet soda, or unsweetened iced tea.  Regular soda, juice, and other mixers might contain a lot of carbohydrates and should be counted. What foods are not recommended? As you make food choices, it is important to remember that all foods are not the same. Some foods have fewer nutrients per serving than other foods, even though they might have the same number of calories or carbohydrates. It is difficult to get your body what it needs when you eat foods with fewer nutrients. Examples of foods that you should avoid that are high in calories and carbohydrates but low in nutrients include:  Trans fats (most processed foods list trans fats on the Nutrition Facts label).  Regular soda.  Juice.  Candy.  Sweets, such as cake, pie, doughnuts, and cookies.  Fried foods. What foods can I eat? Eat nutrient-rich foods, which will nourish your body and keep you healthy. The food you should eat also will depend on several factors, including:  The calories you need.  The medicines you take.  Your weight.  Your blood glucose  level.  Your blood pressure level.  Your cholesterol level. You should eat a variety of foods, including:  Protein.  Lean cuts of meat.  Proteins low in saturated fats, such as fish, egg whites, and beans. Avoid processed meats.  Fruits and vegetables.  Fruits and vegetables that may help control blood glucose levels, such as apples, mangoes,  and yams.  Dairy products.  Choose fat-free or low-fat dairy products, such as milk, yogurt, and cheese.  Grains, bread, pasta, and rice.  Choose whole grain products, such as multigrain bread, whole oats, and brown rice. These foods may help control blood pressure.  Fats.  Foods containing healthful fats, such as nuts, avocado, olive oil, canola oil, and fish. Does everyone with diabetes mellitus have the same meal plan? Because every person with diabetes mellitus is different, there is not one meal plan that works for everyone. It is very important that you meet with a dietitian who will help you create a meal plan that is just right for you. This information is not intended to replace advice given to you by your health care provider. Make sure you discuss any questions you have with your health care provider. Document Released: 01/20/2005 Document Revised: 10/01/2015 Document Reviewed: 03/22/2013 Elsevier Interactive Patient Education  2017 [employer] Inc[employer]  -   Diabetes Mellitus and Exercise Exercising regularly is important for your overall health, especially when you have diabetes (diabetes mellitus). Exercising is not only about losing weight. It has many health benefits, such as increasing muscle strength and bone density and reducing body fat and stress. This leads to improved fitness, flexibility, and endurance, all of which result in better overall health. Exercise has additional benefits for people with diabetes, including:  Reducing appetite.  Helping to lower and control blood glucose.  Lowering blood pressure.  Helping to control amounts of fatty substances (lipids) in the blood, such as cholesterol and triglycerides.  Helping the body to respond better to insulin (improving insulin sensitivity).  Reducing how much insulin the body needs.  Decreasing the risk for heart disease by:  Lowering cholesterol and triglyceride levels.  Increasing the levels of good  cholesterol.  Lowering blood glucose levels. What is my activity plan? Your health care provider or certified diabetes educator can help you make a plan for the type and frequency of exercise (activity plan) that works for you. Make sure that you:  Do at least 150 minutes of moderate-intensity or vigorous-intensity exercise each week. This could be brisk walking, biking, or water aerobics.  Do stretching and strength exercises, such as yoga or weightlifting, at least 2 times a week.  Spread out your activity over at least 3 days of the week.  Get some form of physical activity every day.  Do not go more than 2 days in a row without some kind of physical activity.  Avoid being inactive for more than 90 minutes at a time. Take frequent breaks to walk or stretch.  Choose a type of exercise or activity that you enjoy, and set realistic goals.  Start slowly, and gradually increase the intensity of your exercise over time. What do I need to know about managing my diabetes?  Check your blood glucose before and after exercising.  If your blood glucose is higher than 240 mg/dL (13.3 mmol/L) before you exercise, check your urine for ketones. If you have ketones in your urine, do not exercise until your blood glucose returns to normal.  Know the symptoms of low blood glucose (hypoglycemia)  and how to treat it. Your risk for hypoglycemia increases during and after exercise. Common symptoms of hypoglycemia can include:  Hunger.  Anxiety.  Sweating and feeling clammy.  Confusion.  Dizziness or feeling light-headed.  Increased heart rate or palpitations.  Blurry vision.  Tingling or numbness around the mouth, lips, or tongue.  Tremors or shakes.  Irritability.  Keep a rapid-acting carbohydrate snack available before, during, and after exercise to help prevent or treat hypoglycemia.  Avoid injecting insulin into areas of the body that are going to be exercised. For example, avoid  injecting insulin into:  The arms, when playing tennis.  The legs, when jogging.  Keep records of your exercise habits. Doing this can help you and your health care provider adjust your diabetes management plan as needed. Write down:  Food that you eat before and after you exercise.  Blood glucose levels before and after you exercise.  The type and amount of exercise you have done.  When your insulin is expected to peak, if you use insulin. Avoid exercising at times when your insulin is peaking.  When you start a new exercise or activity, work with your health care provider to make sure the activity is safe for you, and to adjust your insulin, medicines, or food intake as needed.  Drink plenty of water while you exercise to prevent dehydration or heat stroke. Drink enough fluid to keep your urine clear or pale yellow. This information is not intended to replace advice given to you by your health care provider. Make sure you discuss any questions you have with your health care provider. Document Released: 07/16/2003 Document Revised: 11/13/2015 Document Reviewed: 10/05/2015 Elsevier Interactive Patient Education  2017 Else[employer] Inc[employer] -  Diabetes and Foot Care Diabetes may cause you to have problems because of poor blood supply (circulation) to your feet and legs. This may cause the skin on your feet to become thinner, break easier, and heal more slowly. Your skin may become dry, and the skin may peel and crack. You may also have nerve damage in your legs and feet causing decreased feeling in them. You may not notice minor injuries to your feet that could lead to infections or more serious problems. Taking care of your feet is one of the most important things you can do for yourself. Follow these instructions at home:  Wear shoes at all times, even in the house. Do not go barefoot. Bare feet are easily injured.  Check your feet daily for blisters, cuts, and redness. If you cannot see the  bottom of your feet, use a mirror or ask someone for help.  Wash your feet with warm water (do not use hot water) and mild soap. Then pat your feet and the areas between your toes until they are completely dry. Do not soak your feet as this can dry your skin.  Apply a moisturizing lotion or petroleum jelly (that does not contain alcohol and is unscented) to the skin on your feet and to dry, brittle toenails. Do not apply lotion between your toes.  Trim your toenails straight across. Do not dig under them or around the cuticle. File the edges of your nails with an emery board or nail file.  Do not cut corns or calluses or try to remove them with medicine.  Wear clean socks or stockings every day. Make sure they are not too tight. Do not wear knee-high stockings since they may decrease blood flow to your legs.  Wear shoes that  fit properly and have enough cushioning. To break in new shoes, wear them for just a few hours a day. This prevents you from injuring your feet. Always look in your shoes before you put them on to be sure there are no objects inside.  Do not cross your legs. This may decrease the blood flow to your feet.  If you find a minor scrape, cut, or break in the skin on your feet, keep it and the skin around it clean and dry. These areas may be cleansed with mild soap and water. Do not cleanse the area with peroxide, alcohol, or iodine.  When you remove an adhesive bandage, be sure not to damage the skin around it.  If you have a wound, look at it several times a day to make sure it is healing.  Do not use heating pads or hot water bottles. They may burn your skin. If you have lost feeling in your feet or legs, you may not know it is happening until it is too late.  Make sure your health care provider performs a complete foot exam at least annually or more often if you have foot problems. Report any cuts, sores, or bruises to your health care provider immediately. Contact a  health care provider if:  You have an injury that is not healing.  You have cuts or breaks in the skin.  You have an ingrown nail.  You notice redness on your legs or feet.  You feel burning or tingling in your legs or feet.  You have pain or cramps in your legs and feet.  Your legs or feet are numb.  Your feet always feel cold. Get help right away if:  There is increasing redness, swelling, or pain in or around a wound.  There is a red line that goes up your leg.  Pus is coming from a wound.  You develop a fever or as directed by your health care provider.  You notice a bad smell coming from an ulcer or wound. This information is not intended to replace advice given to you by your health care provider. Make sure you discuss any questions you have with your health care provider. Document Released: 04/22/2000 Document Revised: 10/01/2015 Document Reviewed: 10/02/2012 Elsevier Interactive Patient Education  2017 [employer] Inc.   -  Low-Sodium Eating Plan Sodium, which is an element that makes up salt, helps you maintain a healthy balance of fluids in your body. Too much sodium can increase your blood pressure and cause fluid and waste to be held in your body. Your health care provider or dietitian may recommend following this plan if you have high blood pressure (hypertension), kidney disease, liver disease, or heart failure. Eating less sodium can help lower your blood pressure, reduce swelling, and protect your heart, liver, and kidneys. What are tips for following this plan? General guidelines   Most people on this plan should limit their sodium intake to 1,500-2,000 mg (milligrams) of sodium each day. Reading food labels   The Nutrition Facts label lists the amount of sodium in one serving of the food. If you eat more than one serving, you must multiply the listed amount of sodium by the number of servings.  Choose foods with less than 140 mg of sodium per  serving.  Avoid foods with 300 mg of sodium or more per serving. Shopping   Look for lower-sodium products, often labeled as "low-sodium" or "no salt added."  Always check the sodium content even  if foods are labeled as "unsalted" or "no salt added".  Buy fresh foods.  Avoid canned foods and premade or frozen meals.  Avoid canned, cured, or processed meats  Buy breads that have less than 80 mg of sodium per slice. Cooking   Eat more home-cooked food and less restaurant, buffet, and fast food.  Avoid adding salt when cooking. Use salt-free seasonings or herbs instead of table salt or sea salt. Check with your health care provider or pharmacist before using salt substitutes.  Cook with plant-based oils, such as canola, sunflower, or olive oil. Meal planning   When eating at a restaurant, ask that your food be prepared with less salt or no salt, if possible.  Avoid foods that contain MSG (monosodium glutamate). MSG is sometimes added to [place] food, bouillon, and some canned foods. What foods are recommended? The items listed may not be a complete list. Talk with your dietitian about what dietary choices are best for you. Grains  Low-sodium cereals, including oats, puffed wheat and rice, and shredded wheat. Low-sodium crackers. Unsalted rice. Unsalted pasta. Low-sodium bread. Whole-grain breads and whole-grain pasta. Vegetables  Fresh or frozen vegetables. "No salt added" canned vegetables. "No salt added" tomato sauce and paste. Low-sodium or reduced-sodium tomato and vegetable juice. Fruits  Fresh, frozen, or canned fruit. Fruit juice. Meats and other protein foods  Fresh or frozen (no salt added) meat, poultry, seafood, and fish. Low-sodium canned tuna and salmon. Unsalted nuts. Dried peas, beans, and lentils without added salt. Unsalted canned beans. Eggs. Unsalted nut butters. Dairy  Milk. Soy milk. Cheese that is naturally low in sodium, such as ricotta cheese, fresh  mozzarella, or Swiss cheese Low-sodium or reduced-sodium cheese. Cream cheese. Yogurt. Fats and oils  Unsalted butter. Unsalted margarine with no trans fat. Vegetable oils such as canola or olive oils. Seasonings and other foods  Fresh and dried herbs and spices. Salt-free seasonings. Low-sodium mustard and ketchup. Sodium-free salad dressing. Sodium-free light mayonnaise. Fresh or refrigerated horseradish. Lemon juice. Vinegar. Homemade, reduced-sodium, or low-sodium soups. Unsalted popcorn and pretzels. Low-salt or salt-free chips. What foods are not recommended? The items listed may not be a complete list. Talk with your dietitian about what dietary choices are best for you. Grains  Instant hot cereals. Bread stuffing, pancake, and biscuit mixes. Croutons. Seasoned rice or pasta mixes. Noodle soup cups. Boxed or frozen macaroni and cheese. Regular salted crackers. Self-rising flour. Vegetables  Sauerkraut, pickled vegetables, and relishes. Olives. [place] fries. Onion rings. Regular canned vegetables (not low-sodium or reduced-sodium). Regular canned tomato sauce and paste (not low-sodium or reduced-sodium). Regular tomato and vegetable juice (not low-sodium or reduced-sodium). Frozen vegetables in sauces. Meats and other protein foods  Meat or fish that is salted, canned, smoked, spiced, or pickled. Bacon, ham, sausage, hotdogs, corned beef, chipped beef, packaged lunch meats, salt pork, jerky, pickled herring, anchovies, regular canned tuna, sardines, salted nuts. Dairy  Processed cheese and cheese spreads. Cheese curds. Blue cheese. Feta cheese. String cheese. Regular cottage cheese. Buttermilk. Canned milk. Fats and oils  Salted butter. Regular margarine. Ghee. Bacon fat. Seasonings and other foods  Onion salt, garlic salt, seasoned salt, table salt, and sea salt. Canned and packaged gravies. Worcestershire sauce. Tartar sauce. Barbecue sauce. Teriyaki sauce. Soy sauce, including  reduced-sodium. Steak sauce. Fish sauce. Oyster sauce. Cocktail sauce. Horseradish that you find on the shelf. Regular ketchup and mustard. Meat flavorings and tenderizers. Bouillon cubes. Hot sauce and Tabasco sauce. Premade or packaged marinades. Premade or packaged taco  seasonings. Relishes. Regular salad dressings. Salsa. Potato and tortilla chips. Corn chips and puffs. Salted popcorn and pretzels. Canned or dried soups. Pizza. Frozen entrees and pot pies. Summary  Eating less sodium can help lower your blood pressure, reduce swelling, and protect your heart, liver, and kidneys.  Most people on this plan should limit their sodium intake to 1,500-2,000 mg (milligrams) of sodium each day.  Canned, boxed, and frozen foods are high in sodium. Restaurant foods, fast foods, and pizza are also very high in sodium. You also get sodium by adding salt to food.  Try to cook at home, eat more fresh fruits and vegetables, and eat less fast food, canned, processed, or prepared foods. This information is not intended to replace advice given to you by your health care provider. Make sure you discuss any questions you have with your health care provider. Document Released: 10/15/2001 Document Revised: 04/18/2016 Document Reviewed: 04/18/2016 Elsevier Interactive Patient Education  2017 [employer] Maintenance for Postmenopausal Women Menopause is a normal process in which your reproductive ability comes to an end. This process happens gradually over a span of months to years, usually between the ages of 59 and 43. Menopause is complete when you have missed 12 consecutive menstrual periods. It is important to talk with your health care provider about some of the most common conditions that affect postmenopausal women, such as heart disease, cancer, and bone loss (osteoporosis). Adopting a healthy lifestyle and getting preventive care can help to promote your health and wellness. Those actions can  also lower your chances of developing some of these common conditions. What should I know about menopause? During menopause, you may experience a number of symptoms, such as:  Moderate-to-severe hot flashes.  Night sweats.  Decrease in sex drive.  Mood swings.  Headaches.  Tiredness.  Irritability.  Memory problems.  Insomnia. Choosing to treat or not to treat menopausal changes is an individual decision that you make with your health care provider. What should I know about hormone replacement therapy and supplements? Hormone therapy products are effective for treating symptoms that are associated with menopause, such as hot flashes and night sweats. Hormone replacement carries certain risks, especially as you become older. If you are thinking about using estrogen or estrogen with progestin treatments, discuss the benefits and risks with your health care provider. What should I know about heart disease and stroke? Heart disease, heart attack, and stroke become more likely as you age. This may be due, in part, to the hormonal changes that your body experiences during menopause. These can affect how your body processes dietary fats, triglycerides, and cholesterol. Heart attack and stroke are both medical emergencies. There are many things that you can do to help prevent heart disease and stroke:  Have your blood pressure checked at least every 1-2 years. High blood pressure causes heart disease and increases the risk of stroke.  If you are 57-69 years old, ask your health care provider if you should take aspirin to prevent a heart attack or a stroke.  Do not use any tobacco products, including cigarettes, chewing tobacco, or electronic cigarettes. If you need help quitting, ask your health care provider.  It is important to eat a healthy diet and maintain a healthy weight.  Be sure to include plenty of vegetables, fruits, low-fat dairy products, and lean protein.  Avoid eating  foods that are high in solid fats, added sugars, or salt (sodium).  Get regular exercise. This  is one of the most important things that you can do for your health.  Try to exercise for at least 150 minutes each week. The type of exercise that you do should increase your heart rate and make you sweat. This is known as moderate-intensity exercise.  Try to do strengthening exercises at least twice each week. Do these in addition to the moderate-intensity exercise.  Know your numbers.Ask your health care provider to check your cholesterol and your blood glucose. Continue to have your blood tested as directed by your health care provider. What should I know about cancer screening? There are several types of cancer. Take the following steps to reduce your risk and to catch any cancer development as early as possible. Breast Cancer  Practice breast self-awareness.  This means understanding how your breasts normally appear and feel.  It also means doing regular breast self-exams. Let your health care provider know about any changes, no matter how small.  If you are 19 or older, have a clinician do a breast exam (clinical breast exam or CBE) every year. Depending on your age, family history, and medical history, it may be recommended that you also have a yearly breast X-ray (mammogram).  If you have a family history of breast cancer, talk with your health care provider about genetic screening.  If you are at high risk for breast cancer, talk with your health care provider about having an MRI and a mammogram every year.  Breast cancer (BRCA) gene test is recommended for women who have family members with BRCA-related cancers. Results of the assessment will determine the need for genetic counseling and BRCA1 and for BRCA2 testing. BRCA-related cancers include these types:  Breast. This occurs in males or females.  Ovarian.  Tubal. This may also be called fallopian tube cancer.  Cancer of the  abdominal or pelvic lining (peritoneal cancer).  Prostate.  Pancreatic. Cervical, Uterine, and Ovarian Cancer  Your health care provider may recommend that you be screened regularly for cancer of the pelvic organs. These include your ovaries, uterus, and vagina. This screening involves a pelvic exam, which includes checking for microscopic changes to the surface of your cervix (Pap test).  For women ages 21-65, health care providers may recommend a pelvic exam and a Pap test every three years. For women ages 36-65, they may recommend the Pap test and pelvic exam, combined with testing for human papilloma virus (HPV), every five years. Some types of HPV increase your risk of cervical cancer. Testing for HPV may also be done on women of any age who have unclear Pap test results.  Other health care providers may not recommend any screening for nonpregnant women who are considered low risk for pelvic cancer and have no symptoms. Ask your health care provider if a screening pelvic exam is right for you.  If you have had past treatment for cervical cancer or a condition that could lead to cancer, you need Pap tests and screening for cancer for at least 20 years after your treatment. If Pap tests have been discontinued for you, your risk factors (such as having a new sexual partner) need to be reassessed to determine if you should start having screenings again. Some women have medical problems that increase the chance of getting cervical cancer. In these cases, your health care provider may recommend that you have screening and Pap tests more often.  If you have a family history of uterine cancer or ovarian cancer, talk with your health care  provider about genetic screening.  If you have vaginal bleeding after reaching menopause, tell your health care provider.  There are currently no reliable tests available to screen for ovarian cancer. Lung Cancer  Lung cancer screening is recommended for adults  45-62 years old who are at high risk for lung cancer because of a history of smoking. A yearly low-dose CT scan of the lungs is recommended if you:  Currently smoke.  Have a history of at least 30 pack-years of smoking and you currently smoke or have quit within the past 15 years. A pack-year is smoking an average of one pack of cigarettes per day for one year. Yearly screening should:  Continue until it has been 15 years since you quit.  Stop if you develop a health problem that would prevent you from having lung cancer treatment. Colorectal Cancer  This type of cancer can be detected and can often be prevented.  Routine colorectal cancer screening usually begins at age 54 and continues through age 31.  If you have risk factors for colon cancer, your health care provider may recommend that you be screened at an earlier age.  If you have a family history of colorectal cancer, talk with your health care provider about genetic screening.  Your health care provider may also recommend using home test kits to check for hidden blood in your stool.  A small camera at the end of a tube can be used to examine your colon directly (sigmoidoscopy or colonoscopy). This is done to check for the earliest forms of colorectal cancer.  Direct examination of the colon should be repeated every 5-10 years until age 65. However, if early forms of precancerous polyps or small growths are found or if you have a family history or genetic risk for colorectal cancer, you may need to be screened more often. Skin Cancer  Check your skin from head to toe regularly.  Monitor any moles. Be sure to tell your health care provider:  About any new moles or changes in moles, especially if there is a change in a mole's shape or color.  If you have a mole that is larger than the size of a pencil eraser.  If any of your family members has a history of skin cancer, especially at a young age, talk with your health care  provider about genetic screening.  Always use sunscreen. Apply sunscreen liberally and repeatedly throughout the day.  Whenever you are outside, protect yourself by wearing long sleeves, pants, a wide-brimmed hat, and sunglasses. What should I know about osteoporosis? Osteoporosis is a condition in which bone destruction happens more quickly than new bone creation. After menopause, you may be at an increased risk for osteoporosis. To help prevent osteoporosis or the bone fractures that can happen because of osteoporosis, the following is recommended:  If you are 85-105 years old, get at least 1,000 mg of calcium and at least 600 mg of vitamin D per day.  If you are older than age 49 but younger than age 32, get at least 1,200 mg of calcium and at least 600 mg of vitamin D per day.  If you are older than age 65, get at least 1,200 mg of calcium and at least 800 mg of vitamin D per day. Smoking and excessive alcohol intake increase the risk of osteoporosis. Eat foods that are rich in calcium and vitamin D, and do weight-bearing exercises several times each week as directed by your health care provider. What should  I know about how menopause affects my mental health? Depression may occur at any age, but it is more common as you become older. Common symptoms of depression include:  Low or sad mood.  Changes in sleep patterns.  Changes in appetite or eating patterns.  Feeling an overall lack of motivation or enjoyment of activities that you previously enjoyed.  Frequent crying spells. Talk with your health care provider if you think that you are experiencing depression. What should I know about immunizations? It is important that you get and maintain your immunizations. These include:  Tetanus, diphtheria, and pertussis (Tdap) booster vaccine.  Influenza every year before the flu season begins.  Pneumonia vaccine.  Shingles vaccine. Your health care provider may also recommend other  immunizations. This information is not intended to replace advice given to you by your health care provider. Make sure you discuss any questions you have with your health care provider. Document Released: 06/17/2005 Document Revised: 11/13/2015 Document Reviewed: 01/27/2015 Elsevier Interactive Patient Education  2017 [place] for Gastroesophageal Reflux Disease, Adult When you have gastroesophageal reflux disease (GERD), the foods you eat and your eating habits are very important. Choosing the right foods can help ease your discomfort. What guidelines do I need to follow?  Choose fruits, vegetables, whole grains, and low-fat dairy products.  Choose low-fat meat, fish, and poultry.  Limit fats such as oils, salad dressings, butter, nuts, and avocado.  Keep a food diary. This helps you identify foods that cause symptoms.  Avoid foods that cause symptoms. These may be different for everyone.  Eat small meals often instead of 3 large meals a day.  Eat your meals slowly, in a place where you are relaxed.  Limit fried foods.  Cook foods using methods other than frying.  Avoid drinking alcohol.  Avoid drinking large amounts of liquids with your meals.  Avoid bending over or lying down until 2-3 hours after eating. What foods are not recommended? These are some foods and drinks that may make your symptoms worse: Vegetables  Tomatoes. Tomato juice. Tomato and spaghetti sauce. Chili peppers. Onion and garlic. Horseradish. Fruits  Oranges, grapefruit, and lemon (fruit and juice). Meats  High-fat meats, fish, and poultry. This includes hot dogs, ribs, ham, sausage, salami, and bacon. Dairy  Whole milk and chocolate milk. Sour cream. Cream. Butter. Ice cream. Cream cheese. Drinks  Coffee and tea. Bubbly (carbonated) drinks or energy drinks. Condiments  Hot sauce. Barbecue sauce. Sweets/Desserts  Chocolate and cocoa. Donuts. Peppermint and spearmint. Fats and  Oils  High-fat foods. This includes [place] fries and potato chips. Other  Vinegar. Strong spices. This includes black pepper, white pepper, red pepper, cayenne, curry powder, cloves, ginger, and chili powder. The items listed above may not be a complete list of foods and drinks to avoid. Contact your dietitian for more information.  This information is not intended to replace advice given to you by your health care provider. Make sure you discuss any questions you have with your health care provider. Document Released: 10/25/2011 Document Revised: 10/01/2015 Document Reviewed: 02/27/2013 Elsevier Interactive Patient Education  2017 [employer].

## 2016-07-12 NOTE — Progress Notes (Signed)
Diane [name], is a 66 y.o. female  [id number]  [id number]  DOB - 1950-05-30  Chief Complaint  Patient presents with  . Medication Problem        Subjective:   Diane [name] is a 66 y.o. 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  Pt currently seeing ortho Dr Lajoyce Corners for dm foot ulcers, currently walking now w/ walker and special orthotics.  She requested a hhc/home PT consult, and fill out her [facility] transportation ppwk as well.  Pt states she had colonoscopy years ago, found polyps, in [place], but never followup and did not recall what the recommendations are. She is not certain if she wants another cscope, but amendable to talking to GI to discuss risk/benefits of testing.  C/o of chronic constipation still, and asked for more lactulose.    Patient has No headache, No chest pain, No abdominal pain - No Nausea, No new weakness tingling or numbness, No Cough - SOB.  No problems updated.  ALLERGIES: Allergies  Allergen Reactions  . Erythromycin Anaphylaxis  . Biaxin [Clarithromycin] Other (See Comments)  . Feldene [Piroxicam] Diarrhea, Nausea And Vomiting and Other (See Comments)    And sores in mouth  . Lasix [Furosemide] Other (See Comments)    Sores all over body  . Nsaids Other (See Comments)    Contraindicated because of her other meds  . Tetracyclines & Related   . Latex Rash  . Penicillins Rash and Other (See Comments)    Has patient had a PCN reaction causing immediate rash, facial/tongue/throat swelling, SOB or lightheadedness with hypotension: Yes Has patient had a PCN reaction causing severe rash involving mucus membranes or skin necrosis: No Has patient had a PCN reaction that required hospitalization No Has patient had a PCN reaction occurring within the last 10 years: No If all of the  above answers are "NO", then may proceed with Cephalosporin use. And sores in her mouth   . Tape Rash    PAST MEDICAL HISTORY: Past Medical History:  Diagnosis Date  . Anxiety   . Arthritis    "everywhere" (04/26/2016)  . Childhood asthma   . Chronic lower back pain   . Claustrophobia   . Complication of anesthesia    "had metallic taste in my mouth for a year after one of my ORs; next OR they changed some stuff; no problems since" (04/26/2016)  . Depression   . Diabetic polyneuropathy (HCC)   . Family history of adverse reaction to anesthesia    "mom; don't have an idea what it was" (04/26/2016)  . Fibromyalgia   . GERD (gastroesophageal reflux disease)   . Headache    "related to stress" (04/26/2016)  . Hypertension   . Manic, depressive (HCC)    "severe" (04/26/2016)  . Pneumonia    "long time ago" (04/26/2016)  . Sickle cell trait (HCC)   . Sleep apnea    "mask ordered; too claustrophobic to wear" (04/26/2016)  . Type II diabetes mellitus (HCC)     MEDICATIONS AT HOME: Prior to Admission medications   Medication Sig Start Date End Date Taking? Authorizing Provider  ALPRAZolam Prudy Feeler) 0.5 MG tablet Take 1 tablet (0.5 mg total) by mouth 3 (three) times daily. Use sparingly as needed for anxiety 05/13/16   Anders Simmonds, PA-C  amitriptyline (ELAVIL) 50 MG tablet Take 1 tablet (50 mg total)  by mouth at bedtime. 05/31/16   Pete Glatterawn T Alexiana Laverdure, MD  donepezil (ARICEPT) 5 MG tablet Take 1 tablet (5 mg total) by mouth at bedtime. 05/13/16   Anders SimmondsAngela M McClung, PA-C  ergocalciferol (VITAMIN D2) 50000 units capsule Take 1 capsule (50,000 Units total) by mouth once a week. 05/13/16   Anders SimmondsAngela M McClung, PA-C  famotidine (PEPCID) 20 MG tablet Take 1 tablet (20 mg total) by mouth 2 (two) times daily. 07/12/16   Pete Glatterawn T Shanequa Whitenight, MD  gabapentin (NEURONTIN) 300 MG capsule Take 2 capsules (600 mg total) by mouth 3 (three) times daily. 05/28/16   Nadara MustardMarcus Duda V, MD  HYDROcodone-acetaminophen (NORCO)  10-325 MG tablet Take 2 tablets by mouth every 6 (six) hours as needed for moderate pain.    Historical Provider, MD  hydrOXYzine (ATARAX/VISTARIL) 10 MG tablet Take 1 tablet (10 mg total) by mouth every 8 (eight) hours as needed (panic attacks). 07/12/16   Pete Glatterawn T Talena Neira, MD  insulin aspart (NOVOLOG) 100 UNIT/ML injection Inject 1 Units into the skin 3 (three) times daily before meals. Sliding scale; 12 units max per day 06/01/16   Pete Glatterawn T Raahil Ong, MD  Insulin Glargine (LANTUS SOLOSTAR) 100 UNIT/ML Solostar Pen Inject 8 Units into the skin daily at 10 pm. 06/06/16   Pete Glatterawn T Ilena Dieckman, MD  Insulin Pen Needle (ULTICARE MICRO PEN NEEDLES) 32G X 4 MM MISC 1 applicator by Does not apply route at bedtime. 06/06/16   Pete Glatterawn T Calleen Alvis, MD  lactulose (CHRONULAC) 10 GM/15ML solution Take 45 mLs (30 g total) by mouth daily as needed for mild constipation or moderate constipation. Prn daily 07/12/16   Pete Glatterawn T Karra Pink, MD  losartan (COZAAR) 25 MG tablet Take 1 tablet (25 mg total) by mouth daily. 07/12/16   Pete Glatterawn T Mandeep Kiser, MD  oxybutynin (DITROPAN-XL) 10 MG 24 hr tablet Take 1 tablet (10 mg total) by mouth at bedtime. 07/12/16   Pete Glatterawn T Maxima Skelton, MD  oxyCODONE (OXY IR/ROXICODONE) 5 MG immediate release tablet Take 1 tablet (5 mg total) by mouth every 4 (four) hours as needed for breakthrough pain. 04/29/16   Thomasene LotJames Taylor, MD  pravastatin (PRAVACHOL) 20 MG tablet Take 1 tablet (20 mg total) by mouth daily. 06/07/16   Pete Glatterawn T Jorgina Binning, MD     Objective:   Vitals:   07/12/16 1355  BP: (!) 141/90  Pulse: 72  Resp: 16  Temp: 98.1 F (36.7 C)  TempSrc: Oral  SpO2: 97%  Weight: 202 lb 9.6 oz (91.9 kg)    Exam General appearance : Awake, alert, not in any distress. Speech Clear. Not toxic looking, very talkative, pleasant. Walking w/ roller-walker and orthotic foot braces. HEENT: Atraumatic and Normocephalic, pupils equally reactive to light. Neck: supple, no JVD.  Chest:Good air entry bilaterally, no added  sounds. CVS: S1 S2 regular, no murmurs/gallups or rubs. Abdomen: Bowel sounds active, Non tender and not distended with no gaurding, rigidity or rebound. Extremities: B/L Lower Ext shows no edema, both legs are warm to touch, bilat coompression stockings present. Neurology: Awake alert, and oriented X 3, CN II-XII grossly intact, Non focal Skin:No Rash  Data Review Lab Results  Component Value Date   HGBA1C 6.4 07/12/2016   HGBA1C 7.6 (H) 04/26/2016    Depression screen PHQ 2/9 07/12/2016 05/31/2016 05/12/2016  Decreased Interest 3 3 3   Down, Depressed, Hopeless 3 3 3   PHQ - 2 Score 6 6 6   Altered sleeping 3 3 3   Tired, decreased energy 3 3 3   Change in  appetite 3 3 3   Feeling bad or failure about yourself  2 3 3   Trouble concentrating 3 3 3   Moving slowly or fidgety/restless 2 3 2   Suicidal thoughts 1 3 2   PHQ-9 Score 23 27 25       Assessment & Plan   1. Essential hypertension Uncontrolled, never started cozaar that was started in Jan. - advised she picks up and starts cozaar 25 qd - BASIC METABOLIC PANEL WITH GFR - bp check w/ rn Travia in 2 wk, if sbp >130, than chg cozaar to hyzaar 50-12.5 qd. - low salt diet advised  2. Diabetic polyneuropathy associated with type 2 diabetes mellitus (HCC) and ckd 3 Much better controlled, same regimen for now - recd taking pravastatin 20 qhs - Lipid Panel - POCT glucose (manual entry) - POCT glycosylated hemoglobin (Hb A1C)  6.4 (from 7.6 12/17)  - Ambulatory referral to Ophthalmology - Ambulatory referral to Home Health  3. Right foot ulcer, limited to breakdown of skin [facility]) - defer to ortho - Ambulatory referral to Home Health, home rn and pt eval and treat.  4. Anemia, unspecified type - may have been 2nd to infection prior, will rechk. - CBC with Differential - Iron, TIBC and Ferritin Panel  5. Encounter for vitamin deficiency screening - VITAMIN D 25 Hydroxy (Vit-D Deficiency, Fractures)  6. Thyroid disorder  screen - TSH  7. Colon cancer screening - Ambulatory referral to Gastroenterology  8. Breast cancer screening - MM Digital Screening; Future  9. Recd pneumococcal 23, tdap, and flu vac - pt in rush to leave so did not get vaccinations today.  10. Scat ppwk filled out today.  11. Anxiety and panic attacks - psychiatry appt pending, recd f/u - she still has about 10 pills of xanax - added atarax prn for anxiety/panic attacks for now, advised pt risk of BZs and I will not refill xanax.  12. gerd Taking ppi 20 bid, seems better. dw pt long term correlation w/ ckd on ppi, and she already has ckd 3, would recd switching. - trial w/ pepcid 20bid, and tums prn for now - gerd diet discussed   Patient have been counseled extensively about nutrition and exercise  Return in about 2 months (around 09/11/2016), or if symptoms worsen or fail to improve, for dm/anxiety/htn.  The patient was given clear instructions to go to ER or return to medical center if symptoms don't improve, worsen or new problems develop. The patient verbalized understanding. The patient was told to call to get lab results if they haven't heard anything in the next week.   This note has been created with Education officer, environmental. Any transcriptional errors are unintentional.   Pete Glatter, MD, MBA/MHA [facility] and [facility] [place], [place] [phone]   07/12/2016, 3:22 PM

## 2016-07-13 ENCOUNTER — Telehealth: Payer: Self-pay

## 2016-07-13 LAB — IRON,TIBC AND FERRITIN PANEL
%SAT: 11 % (ref 11–50)
FERRITIN: 36 ng/mL (ref 20–288)
IRON: 29 ug/dL — AB (ref 45–160)
TIBC: 271 ug/dL (ref 250–450)

## 2016-07-13 LAB — VITAMIN D 25 HYDROXY (VIT D DEFICIENCY, FRACTURES): Vit D, 25-Hydroxy: 25 ng/mL — ABNORMAL LOW (ref 30–100)

## 2016-07-13 NOTE — Telephone Encounter (Signed)
Message received from the patient requesting a call back.  Call returned to the patient regarding her home health referral. She stated that she is currently receiving home health RN and SW services through [facility] and would like to continue with the agency.  [facility] # [phone]. Spoke to [place] who confirmed that they are providing home health care for the patient. Informed her that the doctor made a referral for RN and PT.  She noted that the patient already has RN services and she will take the verbal order for PT.  [facility] fax # [phone].

## 2016-07-13 NOTE — Telephone Encounter (Signed)
Contacted pt to go over lab results pt didn't answer lvm asking pt to give me a call at her earliest convenience  

## 2016-07-13 NOTE — Telephone Encounter (Signed)
This was ordered by another physician Pete Glatterawn T Langeland, MD

## 2016-07-15 ENCOUNTER — Telehealth: Payer: Self-pay | Admitting: Internal Medicine

## 2016-07-15 ENCOUNTER — Telehealth (INDEPENDENT_AMBULATORY_CARE_PROVIDER_SITE_OTHER): Payer: Self-pay | Admitting: Orthopedic Surgery

## 2016-07-15 NOTE — Telephone Encounter (Signed)
Contacted David from [facility] and lvm giving verbal order to reschedule therapy for next week and if he has any questions or concerns to give me a call

## 2016-07-15 NOTE — Telephone Encounter (Signed)
Diane [name] from [facility] calling to inform PCP that pt called and declined physical therapy evaluation today and would like to reschedule for next week   Requesting a verbal approval to reschedule physical therapy evaluation for next week Please follow up

## 2016-07-15 NOTE — Telephone Encounter (Signed)
Diane [name] (medical Child psychotherapistsocial worker) with [facility] called advised she did new eval and need verbal orders to return to see patient 1 wk 3 for counseling. The number to contact her is [phone]

## 2016-07-19 ENCOUNTER — Telehealth (INDEPENDENT_AMBULATORY_CARE_PROVIDER_SITE_OTHER): Payer: Self-pay | Admitting: Orthopedic Surgery

## 2016-07-19 NOTE — Telephone Encounter (Signed)
I called and lm on vm to advise that it looks like the pt's PCP has put in an order for this pt and if there is anything that we need to do to please let me know. We tried to put in an order in epic pop up alert notified duplicate order and tha the original had come form pcp so we did not place.

## 2016-07-19 NOTE — Telephone Encounter (Signed)
Note    Diane LankJacqueline Animal [name](medical social worker) with [facility] called advised she did new eval and need verbal orders to return to see patient 1 wk 3 for counseling. The number to contact her is [phone]

## 2016-07-21 ENCOUNTER — Telehealth: Payer: Self-pay | Admitting: Internal Medicine

## 2016-07-21 NOTE — Telephone Encounter (Signed)
[facility] requesting verbal orders to continue physical therapy with pt 2 times a week for 4 weeks. Also wanted to inform Dr that pt's resting heartrate was 122, though no distress noted. Please f/u.

## 2016-07-22 ENCOUNTER — Telehealth (INDEPENDENT_AMBULATORY_CARE_PROVIDER_SITE_OTHER): Payer: Self-pay

## 2016-07-22 NOTE — Telephone Encounter (Signed)
Approval from [place]/clearance to preform [facility] due to ulcer we are treating. Also what is WB restrictions

## 2016-07-25 ENCOUNTER — Telehealth: Payer: Self-pay | Admitting: Internal Medicine

## 2016-07-25 ENCOUNTER — Encounter: Payer: Self-pay | Admitting: Internal Medicine

## 2016-07-25 DIAGNOSIS — E559 Vitamin D deficiency, unspecified: Secondary | ICD-10-CM | POA: Insufficient documentation

## 2016-07-25 NOTE — Telephone Encounter (Signed)
Returned [place] call to give verbal order lvm informing her that she has the verbal to see patient and if she has any questions or concerns to give me a call

## 2016-07-25 NOTE — Telephone Encounter (Signed)
Diane [name], may continue PT.  Left vm.

## 2016-07-25 NOTE — Telephone Encounter (Signed)
[name] called requesting a verbal order for a medical social worker. She would like to see the pt. This week. Please f/u

## 2016-07-26 ENCOUNTER — Telehealth (INDEPENDENT_AMBULATORY_CARE_PROVIDER_SITE_OTHER): Payer: Self-pay

## 2016-07-26 ENCOUNTER — Other Ambulatory Visit (INDEPENDENT_AMBULATORY_CARE_PROVIDER_SITE_OTHER): Payer: Self-pay | Admitting: Orthopedic Surgery

## 2016-07-26 MED ORDER — CEPHALEXIN 500 MG PO CAPS: 500.0000 mg | ORAL_CAPSULE | Freq: Three times a day (TID) | ORAL | 0 refills | Status: DC

## 2016-07-26 NOTE — Telephone Encounter (Signed)
I called and advised pt was using a silver alginate dressing and a PRAFO on one side and double upright brace on the other can continue with this no changes and that she is protective weight bearing while we are waiting for the ulcers to heal and that she had been given an rx for extra depth shoes and orthotics and can transition to this once able. To call with additional questions.

## 2016-07-26 NOTE — Telephone Encounter (Signed)
Prescription sent to her pharmacy for Keflex. She is allergic to both penicillins and tetracycline. If she develops a reaction she should stop taking the Keflex.

## 2016-07-26 NOTE — Telephone Encounter (Signed)
We need to see her in the office as soon as possible.

## 2016-07-26 NOTE — Telephone Encounter (Signed)
[facility] calling about patients foot. She has a temp of 100.1, foot warm to touch, redness Patient states she does not have a ride to get her here this wee and wants an antibiotic  [phone] Onalee HuaDavid  FYI--HR at rest is 130

## 2016-07-27 ENCOUNTER — Ambulatory Visit (INDEPENDENT_AMBULATORY_CARE_PROVIDER_SITE_OTHER): Payer: Medicare PPO | Admitting: Orthopedic Surgery

## 2016-07-27 ENCOUNTER — Encounter (INDEPENDENT_AMBULATORY_CARE_PROVIDER_SITE_OTHER): Payer: Self-pay

## 2016-07-27 ENCOUNTER — Ambulatory Visit (INDEPENDENT_AMBULATORY_CARE_PROVIDER_SITE_OTHER): Payer: Medicare PPO

## 2016-07-27 DIAGNOSIS — L97411 Non-pressure chronic ulcer of right heel and midfoot limited to breakdown of skin: Secondary | ICD-10-CM

## 2016-07-27 DIAGNOSIS — M86271 Subacute osteomyelitis, right ankle and foot: Secondary | ICD-10-CM

## 2016-07-27 DIAGNOSIS — E11621 Type 2 diabetes mellitus with foot ulcer: Secondary | ICD-10-CM

## 2016-07-27 NOTE — Telephone Encounter (Signed)
Advised patient antibiotic was sent to her pharmacy, also advised her we need to see her in office asap, her home health nursing is coming today, she is unable to come today due to transportation. She is going to try to organize transportation and call us back to schedule.

## 2016-07-27 NOTE — Progress Notes (Signed)
Office Visit Note   Patient: Diane [name]           Date of Birth: [birth year]           MRN: [id number] Visit Date: 07/27/2016              Requested by: Pete Glatterawn T Langeland, MD [address] [place], [place] [id number] PCP: Pete Glatterawn T Langeland, MD  No chief complaint on file.   HPI: Patient is a 66 year old woman who presents complaining of fever and chills. Patient states that her resting heart rate was 130 yesterday we called a prescription for an antibiotic the patient has not picked this up yet. She complains of discoloration and swelling over the medial aspect of the right ankle. She is been wearing a PRAFO on the right ankle she is status post foot salvage in the left and she states that her left foot is giving her no trouble at all. HPI  Assessment & Plan: Visit Diagnoses:  1. Subacute osteomyelitis of right foot (HCC)   2. Diabetic ulcer of right heel associated with type 2 diabetes mellitus, limited to breakdown of skin (HCC)     Plan: The ulcer was debrided of skin and soft tissue right foot this was superficial 2 cm in diameter 0.1 mm deep this did not probe to bone or tendon. Patient does have some varicose veins in the medial aspect the right foot but there is no cellulitis there is tenderness but no clinical signs of a source of her infection. Recommend that she go the emergency room at this time patient states she has had a productive cough and needs to be evaluated for pulmonary and urinary source of infection.  Follow-Up Instructions: Return in about 2 weeks (around 08/10/2016).   Ortho Exam On examination patient is alert oriented no adenopathy well-dressed normal affect normal respiratory effort her temperature is 102. Exam is the right foot she does have a hypertrophic callus with a plantar ulcer at the midfoot. After informed consent a 10 blade knife was used to debride the skin and soft tissue back to healthy viable granulation tissue. This was touched with silver  nitrate his ulcer was 2 cm in diameter and 0.1 mm deep there is no redness no synovitis no tenderness no signs of infection. Patient does have some varicose veins medially which are tender to palpation medial to the ankle however there is no ascending cellulitis no fluctuance. ROS: Complete review of systems negative except as mentioned in history of present illness with a temperature 102. Imaging: Xr Foot Complete Right  Result Date: 07/27/2016 Three-view radiographs the right foot shows no bony abnormalities no evidence of any chronic osteomyelitis. The plantar ulcer does not communicate any soft tissue abnormalities.   Labs: Lab Results  Component Value Date   HGBA1C 6.4 07/12/2016   HGBA1C 7.6 (H) 04/26/2016   ESRSEDRATE 51 (H) 05/31/2016   ESRSEDRATE 116 (H) 04/26/2016   REPTSTATUS 05/01/2016 FINAL 04/26/2016   CULT NO GROWTH 5 DAYS 04/26/2016    Orders:  Orders Placed This Encounter  Procedures  . XR Foot Complete Right   No orders of the defined types were placed in this encounter.    Procedures: No procedures performed  Clinical Data: No additional findings.  Subjective: Review of Systems  Objective: Vital Signs: There were no vitals taken for this visit.  Specialty Comments:  No specialty comments available.  PMFS History: Patient Active Problem List   Diagnosis Date Noted  . Vitamin  D deficiency 07/25/2016  . Diabetic ulcer of right heel associated with type 2 diabetes mellitus, limited to breakdown of skin (HCC) 05/12/2016  . Osteomyelitis of ankle or foot (HCC)   . 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 Right foot ulcer, limited to breakdown of skin (HCC) 04/26/2016  . Hypertension 04/26/2016  . DM (diabetes mellitus) type II controlled with renal manifestation (HCC) 04/26/2016  . Fibromyalgia 04/26/2016  . Diabetic polyneuropathy (HCC) 04/26/2016   Past  Medical History:  Diagnosis Date  . Anxiety   . Arthritis    "everywhere" (04/26/2016)  . Childhood asthma   . Chronic lower back pain   . Claustrophobia   . Complication of anesthesia    "had metallic taste in my mouth for a year after one of my ORs; next OR they changed some stuff; no problems since" (04/26/2016)  . Depression   . Diabetic polyneuropathy (HCC)   . Family history of adverse reaction to anesthesia    "mom; don't have an idea what it was" (04/26/2016)  . Fibromyalgia   . GERD (gastroesophageal reflux disease)   . Headache    "related to stress" (04/26/2016)  . Hypertension   . Manic, depressive (HCC)    "severe" (04/26/2016)  . Pneumonia    "long time ago" (04/26/2016)  . Sickle cell trait (HCC)   . Sleep apnea    "mask ordered; too claustrophobic to wear" (04/26/2016)  . Type II diabetes mellitus (HCC)     No family history on file.  Past Surgical History:  Procedure Laterality Date  . ABDOMINAL HYSTERECTOMY     "partial"  . APPENDECTOMY    . CALCANEAL OSTEOTOMY Left 04/28/2016   Procedure: CALCANEAL OSTEOTOMY;  Surgeon: Nadara Mustard, MD;  Location: [facility] OR;  Service: Orthopedics;  Laterality: Left;  . CATARACT EXTRACTION W/ INTRAOCULAR LENS  IMPLANT, BILATERAL Bilateral   . FOOT SURGERY Left 2015  . FOOT SURGERY Right 04/2014   subtalar joint arthrodesis, talonavicualr joint arthrodesis, hammertoe repair 2,4,5 right foot  . FRACTURE SURGERY    . LAPAROSCOPIC CHOLECYSTECTOMY    . OPEN REDUCTION INTERNAL FIXATION (ORIF) TIBIA/FIBULA FRACTURE Left   . TOE AMPUTATION Bilateral    toe hallux   . TONSILLECTOMY AND ADENOIDECTOMY     Social History   Occupational History  . Not on file.   Social History Main Topics  . Smoking status: Never Smoker  . Smokeless tobacco: Never Used  . Alcohol use No  . Drug use: No  . Sexual activity: No

## 2016-07-27 NOTE — Telephone Encounter (Signed)
Patient seen in office today. 

## 2016-07-28 ENCOUNTER — Telehealth: Payer: Self-pay | Admitting: Internal Medicine

## 2016-07-28 NOTE — Telephone Encounter (Signed)
Medical social worker with [facility] calling to extend care. Requesting verbal orders for once a week for the following two weeks after the next.

## 2016-07-29 NOTE — Telephone Encounter (Signed)
Returned Diane [name] and gave verbal order to extend care.

## 2016-07-30 NOTE — Telephone Encounter (Signed)
Agree. thanks

## 2016-08-01 ENCOUNTER — Telehealth: Payer: Self-pay | Admitting: Internal Medicine

## 2016-08-01 ENCOUNTER — Ambulatory Visit (INDEPENDENT_AMBULATORY_CARE_PROVIDER_SITE_OTHER): Payer: Medicare PPO | Admitting: Orthopedic Surgery

## 2016-08-01 NOTE — Telephone Encounter (Signed)
Will forward to pcp

## 2016-08-01 NOTE — Telephone Encounter (Signed)
Onalee Huaavid, Physical Therap[facility] informing PCP of a missed visit on Friday, 3/23

## 2016-08-10 ENCOUNTER — Encounter (INDEPENDENT_AMBULATORY_CARE_PROVIDER_SITE_OTHER): Payer: Self-pay

## 2016-08-10 ENCOUNTER — Ambulatory Visit (INDEPENDENT_AMBULATORY_CARE_PROVIDER_SITE_OTHER): Payer: Medicare PPO | Admitting: Orthopedic Surgery

## 2016-08-17 ENCOUNTER — Ambulatory Visit (INDEPENDENT_AMBULATORY_CARE_PROVIDER_SITE_OTHER): Payer: Medicare PPO | Admitting: Family

## 2016-08-17 ENCOUNTER — Ambulatory Visit (INDEPENDENT_AMBULATORY_CARE_PROVIDER_SITE_OTHER): Payer: Medicare PPO | Admitting: Orthopedic Surgery

## 2016-08-17 ENCOUNTER — Encounter (INDEPENDENT_AMBULATORY_CARE_PROVIDER_SITE_OTHER): Payer: Self-pay

## 2016-08-19 ENCOUNTER — Telehealth: Payer: Self-pay | Admitting: Internal Medicine

## 2016-08-19 NOTE — Telephone Encounter (Signed)
Caller calling to advise that pt declined her visit with the Child psychotherapist. Pt stating that she is trying to get her taxes done before the deadline. Please f/u.

## 2016-08-22 NOTE — Telephone Encounter (Signed)
Will forward to pcp

## 2016-08-24 ENCOUNTER — Ambulatory Visit (INDEPENDENT_AMBULATORY_CARE_PROVIDER_SITE_OTHER): Payer: Medicare PPO | Admitting: Orthopedic Surgery

## 2016-08-24 ENCOUNTER — Encounter (INDEPENDENT_AMBULATORY_CARE_PROVIDER_SITE_OTHER): Payer: Self-pay | Admitting: Family

## 2016-08-24 ENCOUNTER — Ambulatory Visit (INDEPENDENT_AMBULATORY_CARE_PROVIDER_SITE_OTHER): Payer: Medicare PPO | Admitting: Family

## 2016-08-24 VITALS — Ht 61.0 in | Wt 202.0 lb

## 2016-08-24 DIAGNOSIS — L97511 Non-pressure chronic ulcer of other part of right foot limited to breakdown of skin: Secondary | ICD-10-CM

## 2016-08-24 NOTE — Progress Notes (Signed)
Office Visit Note   Patient: Diane [name]           Date of Birth: 05-01-1951           MRN: [id number] Visit Date: 08/24/2016              Requested by: Pete Glatter, MD [address] [place], [place] [address] PCP: Pete Glatter, MD  Chief Complaint  Patient presents with  . Right Foot - Follow-up    Ulcer scabbed at heel and under second mth  . Left Foot - Follow-up    The patient is a 66 year old woman who presents today for evaluation of her right foot. Has 2 plantar ulcers. Beneath her heel as well as beneath the third metatarsal head. These are scabbed over. The patient states they're fully healed. She is resistant to debriding the callus. Does have a blister him underneath her second toe there is dried blood this is draining. States it has ruptured. She is in double upright rate brace is bilaterally ambulating with a walker. She requests that we discontinue her home health. Does however walk outpatient physical therapy.     Assessment & Plan: Visit Diagnoses:  1. Right foot ulcer, limited to breakdown of skin (HCC)     Plan: The ulcer was debrided of skin and soft tissue right foot this was superficial. We'll continue with her daily wound care as well as protective shoe wear.   Follow-Up Instructions: Return in about 4 weeks (around 09/21/2016).   Ortho Exam On examination patient is alert oriented no adenopathy well-dressed normal affect normal respiratory effort. Exam is the right foot she does have a hypertrophic callus with a plantar ulcer at the midfoot. After informed consent a 10 blade knife was used to debride the skin and soft tissue back to healthy viable  tissue.This ulcer was 2 cm in diameter and 0.1 mm deep there is no redness.  no tenderness no signs of infection. Beneath the third metatarsal head there is also a 2 cm in diameter calloused ulceration this was hard back to viable tissue. There is no drainage. Ulceration beneath the second toe is 1 cm  in diameter there is pink granulation tissue in the wound bed there is no purulence no odor no sign of infection  ROS: Complete review of systems negative except as mentioned in history of present illness with a temperature 102. Imaging: No results found.  Labs: Lab Results  Component Value Date   HGBA1C 6.4 07/12/2016   HGBA1C 7.6 (H) 04/26/2016   ESRSEDRATE 51 (H) 05/31/2016   ESRSEDRATE 116 (H) 04/26/2016   REPTSTATUS 05/01/2016 FINAL 04/26/2016   CULT NO GROWTH 5 DAYS 04/26/2016    Orders:  No orders of the defined types were placed in this encounter.  No orders of the defined types were placed in this encounter.    Procedures: No procedures performed  Clinical Data: No additional findings.  Subjective: Review of Systems  Constitutional: Negative for chills and fever.  Skin: Positive for wound.    Objective: Vital Signs: Ht  (1.549 m)   Wt 202 lb (91.6 kg)   BMI 38.17 kg/m   Specialty Comments:  No specialty comments available.  PMFS History: Patient Active Problem List   Diagnosis Date Noted  . Vitamin D deficiency 07/25/2016  . Diabetic ulcer of right heel associated with type 2 diabetes mellitus, limited to breakdown of skin (HCC) 05/12/2016  . Osteomyelitis of ankle or foot   .  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 Right foot ulcer, limited to breakdown of skin (HCC) 04/26/2016  . Hypertension 04/26/2016  . DM (diabetes mellitus) type II controlled with renal manifestation (HCC) 04/26/2016  . Fibromyalgia 04/26/2016  . Diabetic polyneuropathy (HCC) 04/26/2016   Past Medical History:  Diagnosis Date  . Anxiety   . Arthritis    "everywhere" (04/26/2016)  . Childhood asthma   . Chronic lower back pain   . Claustrophobia   . Complication of anesthesia    "had metallic taste in my mouth for a year after one of my ORs; next OR they changed some stuff;  no problems since" (04/26/2016)  . Depression   . Diabetic polyneuropathy (HCC)   . Family history of adverse reaction to anesthesia    "mom; don't have an idea what it was" (04/26/2016)  . Fibromyalgia   . GERD (gastroesophageal reflux disease)   . Headache    "related to stress" (04/26/2016)  . Hypertension   . Manic, depressive (HCC)    "severe" (04/26/2016)  . Pneumonia    "long time ago" (04/26/2016)  . Sickle cell trait (HCC)   . Sleep apnea    "mask ordered; too claustrophobic to wear" (04/26/2016)  . Type II diabetes mellitus (HCC)     No family history on file.  Past Surgical History:  Procedure Laterality Date  . ABDOMINAL HYSTERECTOMY     "partial"  . APPENDECTOMY    . CALCANEAL OSTEOTOMY Left 04/28/2016   Procedure: CALCANEAL OSTEOTOMY;  Surgeon: Nadara Mustard, MD;  Location: [facility] OR;  Service: Orthopedics;  Laterality: Left;  . CATARACT EXTRACTION W/ INTRAOCULAR LENS  IMPLANT, BILATERAL Bilateral   . FOOT SURGERY Left 2015  . FOOT SURGERY Right 04/2014   subtalar joint arthrodesis, talonavicualr joint arthrodesis, hammertoe repair 2,4,5 right foot  . FRACTURE SURGERY    . LAPAROSCOPIC CHOLECYSTECTOMY    . OPEN REDUCTION INTERNAL FIXATION (ORIF) TIBIA/FIBULA FRACTURE Left   . TOE AMPUTATION Bilateral    toe hallux   . TONSILLECTOMY AND ADENOIDECTOMY     Social History   Occupational History  . Not on file.   Social History Main Topics  . Smoking status: Never Smoker  . Smokeless tobacco: Never Used  . Alcohol use No  . Drug use: No  . Sexual activity: No

## 2016-09-06 ENCOUNTER — Other Ambulatory Visit: Payer: Self-pay | Admitting: Internal Medicine

## 2016-09-06 ENCOUNTER — Encounter: Payer: Self-pay | Admitting: Internal Medicine

## 2016-09-06 ENCOUNTER — Telehealth: Payer: Self-pay | Admitting: Internal Medicine

## 2016-09-06 ENCOUNTER — Ambulatory Visit: Payer: Medicare PPO | Attending: Internal Medicine | Admitting: Internal Medicine

## 2016-09-06 VITALS — BP 145/98 | HR 110 | Temp 98.2°F | Resp 16 | Wt 198.2 lb

## 2016-09-06 DIAGNOSIS — M1990 Unspecified osteoarthritis, unspecified site: Secondary | ICD-10-CM | POA: Diagnosis not present

## 2016-09-06 DIAGNOSIS — Z79899 Other long term (current) drug therapy: Secondary | ICD-10-CM | POA: Diagnosis not present

## 2016-09-06 DIAGNOSIS — Z1239 Encounter for other screening for malignant neoplasm of breast: Secondary | ICD-10-CM

## 2016-09-06 DIAGNOSIS — E1142 Type 2 diabetes mellitus with diabetic polyneuropathy: Secondary | ICD-10-CM

## 2016-09-06 DIAGNOSIS — F410 Panic disorder [episodic paroxysmal anxiety] without agoraphobia: Secondary | ICD-10-CM | POA: Insufficient documentation

## 2016-09-06 DIAGNOSIS — Z794 Long term (current) use of insulin: Secondary | ICD-10-CM | POA: Diagnosis not present

## 2016-09-06 DIAGNOSIS — D573 Sickle-cell trait: Secondary | ICD-10-CM | POA: Insufficient documentation

## 2016-09-06 DIAGNOSIS — Z888 Allergy status to other drugs, medicaments and biological substances status: Secondary | ICD-10-CM | POA: Diagnosis not present

## 2016-09-06 DIAGNOSIS — I10 Essential (primary) hypertension: Secondary | ICD-10-CM

## 2016-09-06 DIAGNOSIS — F319 Bipolar disorder, unspecified: Secondary | ICD-10-CM | POA: Diagnosis not present

## 2016-09-06 DIAGNOSIS — Z880 Allergy status to penicillin: Secondary | ICD-10-CM | POA: Insufficient documentation

## 2016-09-06 DIAGNOSIS — J45909 Unspecified asthma, uncomplicated: Secondary | ICD-10-CM | POA: Insufficient documentation

## 2016-09-06 DIAGNOSIS — G894 Chronic pain syndrome: Secondary | ICD-10-CM | POA: Diagnosis not present

## 2016-09-06 DIAGNOSIS — F419 Anxiety disorder, unspecified: Secondary | ICD-10-CM | POA: Diagnosis not present

## 2016-09-06 DIAGNOSIS — K219 Gastro-esophageal reflux disease without esophagitis: Secondary | ICD-10-CM | POA: Diagnosis not present

## 2016-09-06 DIAGNOSIS — M797 Fibromyalgia: Secondary | ICD-10-CM | POA: Insufficient documentation

## 2016-09-06 DIAGNOSIS — Z1211 Encounter for screening for malignant neoplasm of colon: Secondary | ICD-10-CM

## 2016-09-06 DIAGNOSIS — Z23 Encounter for immunization: Secondary | ICD-10-CM | POA: Diagnosis not present

## 2016-09-06 DIAGNOSIS — G4730 Sleep apnea, unspecified: Secondary | ICD-10-CM | POA: Insufficient documentation

## 2016-09-06 DIAGNOSIS — G4700 Insomnia, unspecified: Secondary | ICD-10-CM | POA: Diagnosis not present

## 2016-09-06 DIAGNOSIS — Z1231 Encounter for screening mammogram for malignant neoplasm of breast: Secondary | ICD-10-CM

## 2016-09-06 LAB — GLUCOSE, POCT (MANUAL RESULT ENTRY): POC Glucose: 163 mg/dl — AB (ref 70–99)

## 2016-09-06 MED ORDER — HYDROXYZINE HCL 25 MG PO TABS: 25.0000 mg | ORAL_TABLET | Freq: Three times a day (TID) | ORAL | 3 refills | Status: DC | PRN

## 2016-09-06 MED ORDER — FAMOTIDINE 20 MG PO TABS: 20.0000 mg | ORAL_TABLET | Freq: Two times a day (BID) | ORAL | 3 refills | Status: AC

## 2016-09-06 MED ORDER — OXYBUTYNIN CHLORIDE ER 10 MG PO TB24: 10.0000 mg | ORAL_TABLET | Freq: Every day | ORAL | 3 refills | Status: DC

## 2016-09-06 MED ORDER — PRAVASTATIN SODIUM 20 MG PO TABS: 20.0000 mg | ORAL_TABLET | Freq: Every day | ORAL | 3 refills | Status: AC

## 2016-09-06 MED ORDER — INSULIN ASPART 100 UNIT/ML ~~LOC~~ SOLN: 1.0000 [IU] | Freq: Three times a day (TID) | SUBCUTANEOUS | 2 refills | Status: DC

## 2016-09-06 MED ORDER — LOSARTAN POTASSIUM 50 MG PO TABS: 50.0000 mg | ORAL_TABLET | Freq: Every day | ORAL | 3 refills | Status: DC

## 2016-09-06 MED ORDER — GLUCOSE BLOOD VI STRP: ORAL_STRIP | 12 refills | Status: DC

## 2016-09-06 NOTE — Telephone Encounter (Signed)
Will forward to pcp

## 2016-09-06 NOTE — Telephone Encounter (Signed)
Pt calling to state she just left appt with Dr and forgot to ask for a refill of her Novolog, testing strips, and the caps for the injections. Uses the sliding scale on the Novolog. Uses the [facility] on [address].

## 2016-09-06 NOTE — Progress Notes (Signed)
Diane [name], is a 66 y.o. female  UJW:[id number]  FAO:[id number]  DOB - [birth year]  Chief Complaint  Patient presents with  . Medication Problem        Subjective:   Diane [name] is a 66 y.o. female here today for a follow up visit, last seen 07/12/16, w/ hx of dm, htn osteomyelitis left calcaneous sp recent surgical debridement, "arthritis" - neg ana/rf labs recently, but was followed by Rheum in [facility] prior for chronic pain syndrome and polyneuropathy, MDD/depression/anxiety.  Of note, she still has some xanex at home, but using sparingly. She is using the visteril, which is helping some w/ her panic attack/anxiety, but not completely. Asked if dose could be increased.  She is taking most of her other meds, including gabapentin.   She is moving more overall now, and notes after movement/walking great lengths, gets aches in her right groin region.   Patient has No headache, No chest pain, No abdominal pain - No Nausea, No new weakness tingling or numbness, No Cough - SOB.  No problems updated.  ALLERGIES: Allergies  Allergen Reactions  . Erythromycin Anaphylaxis  . Biaxin [Clarithromycin] Other (See Comments)  . Feldene [Piroxicam] Diarrhea, Nausea And Vomiting and Other (See Comments)    And sores in mouth  . Lasix [Furosemide] Other (See Comments)    Sores all over body  . Nsaids Other (See Comments)    Contraindicated because of her other meds  . Tetracyclines & Related   . Latex Rash  . Penicillins Rash and Other (See Comments)    Has patient had a PCN reaction causing immediate rash, facial/tongue/throat swelling, SOB or lightheadedness with hypotension: Yes Has patient had a PCN reaction causing severe rash involving mucus membranes or skin necrosis: No Has patient had a PCN reaction that required hospitalization No Has patient had a PCN reaction occurring within the last 10 years: No If all of the above answers are "NO", then may proceed with  Cephalosporin use. And sores in her mouth   . Tape Rash    PAST MEDICAL HISTORY: Past Medical History:  Diagnosis Date  . Anxiety   . Arthritis    "everywhere" (04/26/2016)  . Childhood asthma   . Chronic lower back pain   . Claustrophobia   . Complication of anesthesia    "had metallic taste in my mouth for a year after one of my ORs; next OR they changed some stuff; no problems since" (04/26/2016)  . Depression   . Diabetic polyneuropathy (HCC)   . Family history of adverse reaction to anesthesia    "mom; don't have an idea what it was" (04/26/2016)  . Fibromyalgia   . GERD (gastroesophageal reflux disease)   . Headache    "related to stress" (04/26/2016)  . Hypertension   . Manic, depressive (HCC)    "severe" (04/26/2016)  . Pneumonia    "long time ago" (04/26/2016)  . Sickle cell trait (HCC)   . Sleep apnea    "mask ordered; too claustrophobic to wear" (04/26/2016)  . Type II diabetes mellitus (HCC)     MEDICATIONS AT HOME: Prior to Admission medications   Medication Sig Start Date End Date Taking? Authorizing Provider  ALPRAZolam Prudy Feeler) 0.5 MG tablet Take 1 tablet (0.5 mg total) by mouth 3 (three) times daily. Use sparingly as needed for anxiety 05/13/16   Anders Simmonds, PA-C  amitriptyline (ELAVIL) 50 MG tablet Take 1 tablet (50 mg total) by mouth at bedtime. 05/31/16   Shannah Conteh  Marland Mcalpine, MD  cephALEXin (KEFLEX) 500 MG capsule Take 1 capsule (500 mg total) by mouth 3 (three) times daily. 07/26/16   Nadara Mustard, MD  donepezil (ARICEPT) 5 MG tablet Take 1 tablet (5 mg total) by mouth at bedtime. 05/13/16   Anders Simmonds, PA-C  ergocalciferol (VITAMIN D2) 50000 units capsule Take 1 capsule (50,000 Units total) by mouth once a week. 05/13/16   Anders Simmonds, PA-C  famotidine (PEPCID) 20 MG tablet Take 1 tablet (20 mg total) by mouth 2 (two) times daily. 09/06/16   Pete Glatter, MD  gabapentin (NEURONTIN) 300 MG capsule Take 2 capsules (600 mg total) by mouth 3  (three) times daily. 05/28/16   Nadara Mustard, MD  HYDROcodone-acetaminophen (NORCO) 10-325 MG tablet Take 2 tablets by mouth every 6 (six) hours as needed for moderate pain.    Historical Provider, MD  hydrOXYzine (ATARAX/VISTARIL) 25 MG tablet Take 1 tablet (25 mg total) by mouth every 8 (eight) hours as needed (panic attacks). 09/06/16   Pete Glatter, MD  insulin aspart (NOVOLOG) 100 UNIT/ML injection Inject 1 Units into the skin 3 (three) times daily before meals. Sliding scale; 12 units max per day 06/01/16   Pete Glatter, MD  Insulin Glargine (LANTUS SOLOSTAR) 100 UNIT/ML Solostar Pen Inject 8 Units into the skin daily at 10 pm. Patient not taking: Reported on 08/24/2016 06/06/16   Pete Glatter, MD  Insulin Pen Needle (ULTICARE MICRO PEN NEEDLES) 32G X 4 MM MISC 1 applicator by Does not apply route at bedtime. 06/06/16   Pete Glatter, MD  lactulose (CHRONULAC) 10 GM/15ML solution Take 45 mLs (30 g total) by mouth daily as needed for mild constipation or moderate constipation. Prn daily 07/12/16   Pete Glatter, MD  losartan (COZAAR) 50 MG tablet Take 1 tablet (50 mg total) by mouth daily. 09/06/16   Pete Glatter, MD  oxybutynin (DITROPAN-XL) 10 MG 24 hr tablet Take 1 tablet (10 mg total) by mouth at bedtime. 09/06/16   Pete Glatter, MD  pravastatin (PRAVACHOL) 20 MG tablet Take 1 tablet (20 mg total) by mouth daily. 09/06/16   Pete Glatter, MD     Objective:   Vitals:   09/06/16 0955  BP: (!) 145/98  Pulse: (!) 110  Resp: 16  Temp: 98.2 F (36.8 C)  TempSrc: Oral  SpO2: 95%  Weight: 198 lb 3.2 oz (89.9 kg)    Exam General appearance : Awake, alert, not in any distress. Speech Clear. Not toxic looking, morbid obese, walking w/ roller walker, pleasant. Speech is rapid, tangental.  Talks about her soon to be grandkid, (which are John Legend's and Chrissy Tiegen's child that they are expecting).  Pleasant. HEENT: Atraumatic and Normocephalic, pupils equally reactive to  light. Neck: supple, no JVD. Chest:Good air entry bilaterally, no added sounds. CVS: S1 S2 regular, no murmurs/gallups or rubs. Abdomen: Bowel sounds active, obese, Non tender and not distended with no gaurding, rigidity or rebound. Extremities: B/L Lower Ext shows no edema, bilat amputations and prior surgery noted in bilat feet, Monospot 0/3 bilat.  nttp. both legs are warm to touch Neurology: Awake alert, and oriented X 3, CN II-XII grossly intact, Non focal Skin:No Rash  Data Review Lab Results  Component Value Date   HGBA1C 6.4 07/12/2016   HGBA1C 7.6 (H) 04/26/2016    Depression screen PHQ 2/9 07/12/2016 05/31/2016 05/12/2016  Decreased Interest Down, Depressed, Hopeless 3 3  3  PHQ - 2 Score Altered sleeping Tired, decreased energy Change in appetite Feeling bad or failure about yourself  Trouble concentrating Moving slowly or fidgety/restless Suicidal thoughts PHQ-9 Score Assessment & Plan   1. Diabetic polyneuropathy associated with type 2 diabetes mellitus (HCC) - last a1c 6.4 (07/12/16), novolog prn, diet controlled, not taking lantus (will dc.) - POCT glucose (manual entry) - Ambulatory referral to Rheumatology - Ambulatory referral to Ophthalmology - has not had eye exam in 2-3 years  2. Essential hypertension Remains elevated, may certainly be associated w/ her anxiety, but high last time as well Will increase cozaar to 50 qd.  - fu RN [facility] Pen 2 wks for bp check, if >130, than changed cozaar to hyzaar 50-12.5 qd, fu pcp 1 month  3. Insomnia, unspecified type Taking elavil, but still has trouble w/ sleep due to neuropathic pain. - renewed elavil  4. Anxiety/mdd/panic attacks - advised pt to wean down her xanex as able. - increase hydorxyzine to 25 q 8 hrs prn - Ambulatory referral to Psychiatry, or note she has been on lithium, lexapro, zoloft and numerous other medications, I am not  comfortable starting her  On something currently.  5. Colon cancer screening Now has transportation,  - Ambulatory referral to Gastroenterology - resend referral  6. Breast cancer screening Now has transporation - [facility] Screening; Future - resend referral     Patient have been counseled extensively about nutrition and exercise  Return in about 6 weeks (around 10/18/2016) for dm/htn/polyneuropathy.  The patient was given clear instructions to go to ER or return to medical center if symptoms don't improve, worsen or new problems develop. The patient verbalized understanding. The patient was told to call to get lab results if they haven't heard anything in the next week.   This note has been created with Education officer, environmental. Any transcriptional errors are unintentional.   Pete Glatter, MD, MBA/MHA [facility] and [facility] [place], [place] [phone]   09/06/2016, 10:57 AM

## 2016-09-06 NOTE — Telephone Encounter (Signed)
I renewed the novolog and the test strips (accucheck machine) and sent to [facility]. Not certain what caps she is talking about though. The needles she uses should come w/ caps.

## 2016-09-06 NOTE — Patient Instructions (Addendum)
Diane [name] 2 wks bp check  Calcium  /daily and Vit D 5,000IU daily for bone health - over the counter   Td Vaccine (Tetanus and Diphtheria): What You Need to Know 1. Why get vaccinated? Tetanus  and diphtheria are very serious diseases. They are rare in the [place] today, but people who do become infected often have severe complications. Td vaccine is used to protect adolescents and adults from both of these diseases. Both tetanus and diphtheria are infections caused by bacteria. Diphtheria spreads from person to person through coughing or sneezing. Tetanus-causing bacteria enter the body through cuts, scratches, or wounds. TETANUS (lockjaw) causes painful muscle tightening and stiffness, usually all over the body.  It can lead to tightening of muscles in the head and neck so you can't open your mouth, swallow, or sometimes even breathe. Tetanus kills about 1 out of every 10 people who are infected even after receiving the best medical care. DIPHTHERIA can cause a thick coating to form in the back of the throat.  It can lead to breathing problems, paralysis, heart failure, and death. Before vaccines, as many as 200,000 cases of diphtheria and hundreds of cases of tetanus were reported in the [place] each year. Since vaccination began, reports of cases for both diseases have dropped by about 99%. 2. Td vaccine Td vaccine can protect adolescents and adults from tetanus and diphtheria. Td is usually given as a booster dose every 10 years but it can also be given earlier after a severe and dirty wound or burn. Another vaccine, called Tdap, which protects against pertussis in addition to tetanus and diphtheria, is sometimes recommended instead of Td vaccine. Your doctor or the person giving you the vaccine can give you more information. Td may safely be given at the same time as other vaccines. 3. Some people should not get this vaccine  A person who has ever had a life-threatening  allergic reaction after a previous dose of any tetanus or diphtheria containing vaccine, OR has a severe allergy to any part of this vaccine, should not get Td vaccine. Tell the person giving the vaccine about any severe allergies.  Talk to your doctor if you:  had severe pain or swelling after any vaccine containing diphtheria or tetanus,  ever had a condition called Guillain Barre Syndrome (GBS),  aren't feeling well on the day the shot is scheduled. 4. What are the risks from Td vaccine? With any medicine, including vaccines, there is a chance of side effects. These are usually mild and go away on their own. Serious reactions are also possible but are rare. Most people who get Td vaccine do not have any problems with it. Mild problems following Td vaccine:  (Did not interfere with activities)  Pain where the shot was given (about 8 people in 10)  Redness or swelling where the shot was given (about 1 person in 4)  Mild fever (rare)  Headache (about 1 person in 4)  Tiredness (about 1 person in 4) Moderate problems following Td vaccine:  (Interfered with activities, but did not require medical attention)  Fever over 102F (rare) Severe problems following Td vaccine:  (Unable to perform usual activities; required medical attention)  Swelling, severe pain, bleeding and/or redness in the arm where the shot was given (rare). Problems that could happen after any vaccine:   People sometimes faint after a medical procedure, including vaccination. Sitting or lying down for about 15 minutes can help prevent fainting, and injuries caused  by a fall. Tell your doctor if you feel dizzy, or have vision changes or ringing in the ears.  Some people get severe pain in the shoulder and have difficulty moving the arm where a shot was given. This happens very rarely.  Any medication can cause a severe allergic reaction. Such reactions from a vaccine are very rare, estimated at fewer than 1 in a  million doses, and would happen within a few minutes to a few hours after the vaccination. As with any medicine, there is a very remote chance of a vaccine causing a serious injury or death. The safety of vaccines is always being monitored. For more information, visit: http://floyd.org/ 5. What if there is a serious reaction? What should I look for?  Look for anything that concerns you, such as signs of a severe allergic reaction, very high fever, or unusual behavior. Signs of a severe allergic reaction can include hives, swelling of the face and throat, difficulty breathing, a fast heartbeat, dizziness, and weakness. These would usually start a few minutes to a few hours after the vaccination. What should I do?   If you think it is a severe allergic reaction or other emergency that can't wait, call 9-1-1 or get the person to the nearest hospital. Otherwise, call your doctor.  Afterward, the reaction should be reported to the Vaccine Adverse Event Reporting System (VAERS). Your doctor might file this report, or you can do it yourself through the VAERS web site at www.vaers.LAgents.no, or by calling 1-563-538-4213.  VAERS does not give medical advice. 6. The National Vaccine Injury Compensation Program The [employer] Vaccine Injury Compensation Program (VICP) is a federal program that was created to compensate people who may have been injured by certain vaccines. Persons who believe they may have been injured by a vaccine can learn about the program and about filing a claim by calling 1-(218) 284-5339 or visiting the VICP website at SpiritualWord.at. There is a time limit to file a claim for compensation. 7. How can I learn more?  Ask your doctor. He or she can give you the vaccine package insert or suggest other sources of information.  Call your local or state health department.  Contact the Centers for Disease Control and Prevention (CDC):  Call 331-854-2136  (1-800-CDC-INFO)  Visit CDC's website at PicCapture.uy CDC Td Vaccine VIS (08/18/15) This information is not intended to replace advice given to you by your health care provider. Make sure you discuss any questions you have with your health care provider. Document Released: 02/20/2006 Document Revised: 01/14/2016 Document Reviewed: 01/14/2016 Elsevier Interactive Patient Education  2017 [employer].

## 2016-09-07 ENCOUNTER — Ambulatory Visit (INDEPENDENT_AMBULATORY_CARE_PROVIDER_SITE_OTHER): Payer: Medicare PPO | Admitting: Orthopedic Surgery

## 2016-09-07 ENCOUNTER — Other Ambulatory Visit: Payer: Self-pay | Admitting: Pharmacist

## 2016-09-07 ENCOUNTER — Other Ambulatory Visit: Payer: Self-pay | Admitting: Internal Medicine

## 2016-09-07 ENCOUNTER — Encounter (INDEPENDENT_AMBULATORY_CARE_PROVIDER_SITE_OTHER): Payer: Self-pay | Admitting: Orthopedic Surgery

## 2016-09-07 ENCOUNTER — Ambulatory Visit (INDEPENDENT_AMBULATORY_CARE_PROVIDER_SITE_OTHER): Payer: Medicare PPO

## 2016-09-07 VITALS — Ht 61.0 in | Wt 198.0 lb

## 2016-09-07 DIAGNOSIS — M25552 Pain in left hip: Secondary | ICD-10-CM

## 2016-09-07 DIAGNOSIS — M792 Neuralgia and neuritis, unspecified: Secondary | ICD-10-CM

## 2016-09-07 DIAGNOSIS — M1711 Unilateral primary osteoarthritis, right knee: Secondary | ICD-10-CM | POA: Diagnosis not present

## 2016-09-07 DIAGNOSIS — M25551 Pain in right hip: Secondary | ICD-10-CM

## 2016-09-07 DIAGNOSIS — G894 Chronic pain syndrome: Secondary | ICD-10-CM

## 2016-09-07 MED ORDER — GLUCOSE BLOOD VI STRP: ORAL_STRIP | 12 refills | Status: DC

## 2016-09-07 MED ORDER — ACCU-CHEK AVIVA PLUS W/DEVICE KIT: PACK | 0 refills | Status: AC

## 2016-09-07 NOTE — Progress Notes (Signed)
Chronic pain and polyneuropathy. Use to see [facility] in [facility], but [place] [facility] does not treat this. Will try referral to [facility]

## 2016-09-07 NOTE — Progress Notes (Signed)
Office Visit Note   Patient: Diane [name]           Date of Birth: [birth year]           MRN: [id number] Visit Date: 09/07/2016              Requested by: Pete Glatter, MD [address] [place], [place] [address] PCP: Pete Glatter, MD  Chief Complaint  Patient presents with  . 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 She is currently ambulating with double upright brace's bilaterally. Patient states that she's noticed some drainage from both of the wounds on the right foot. Patient states she's had physical therapy in the past and is interested in repeating physical therapy she states that she feels dizzy unstable and tired from taking the gabapentin. Patient is concerned that she may have arthritis in both hips and her right knee she has pain with lying on either hip she states that her right knee slips out of the socket. She also complains of spasm in her hands the base of the thumb carpal metacarpal joint.  Assessment & Plan: Visit Diagnoses:  1. Pain in right hip   2. Unilateral primary osteoarthritis, right knee   3. Pain in left hip     Plan: Plan ulcers debrided of skin and soft tissue in the right foot 2 recommend that she hold on therapy and hold on her walking on the right foot until these ulcers have healed she will start antibiotic ointment dressing changes and bandage dressing changes daily. Do not feel there is any other intervention necessary for the right knee or hips at this time. Patient may need physical therapy after follow-up on the ulcers are healed. Recommended discontinuing the Neurontin if she was having trouble with her gait due to the medication.  Follow-Up Instructions: No Follow-up on file.   Ortho Exam  Patient is alert, oriented, no adenopathy,  well-dressed, normal affect, normal respiratory effort. On examination patient ambulates with double upright brace as she has an antalgic gait. Examination the left heel the calcaneal ostectomy is well-healed there is no plantar ulcers has a palpable dorsalis pedis pulse bilaterally no evidence of arterial insufficiency. Semination both hip she has no pain with range of motion of either hip she has no pain with range of motion of right knee classically should stable the patella tracks midline there is no subluxation of the patella. Examination the right foot she has to [facility] grade 1 ulcers with small amount of drainage. After informed consent 10 blade knife was used to debride the skin and soft tissue back to healthy viable granulation tissue this was touched with silver nitrate Band-Aid and Iodosorb was applied. Patient ulcers are 2 cm in diameter and 3 mm deep. Patient has 1 ulcer on the hindfoot plantar aspect and one ulcer on the forefoot plantar aspect.  Imaging: No results found.  Labs: Lab Results  Component Value Date   HGBA1C 6.4 07/12/2016   HGBA1C 7.6 (H) 04/26/2016   ESRSEDRATE 51 (H) 05/31/2016   ESRSEDRATE 116 (H) 04/26/2016   REPTSTATUS 05/01/2016 FINAL 04/26/2016   CULT NO GROWTH 5 DAYS 04/26/2016    Orders:  Orders Placed This Encounter  Procedures  . XR Knee 1-2 Views Right  .  XR HIP UNILAT W OR W/O PELVIS 2-3 VIEWS LEFT  . XR HIP UNILAT W OR W/O PELVIS 2-3 VIEWS RIGHT   No orders of the defined types were placed in this encounter.    Procedures: No procedures performed  Clinical Data: No additional findings.  ROS:  All other systems negative, except as noted in the HPI. Review of Systems  Objective: Vital Signs: Ht  (1.549 m)   Wt 198 lb (89.8 kg)   BMI 37.41 kg/m   Specialty Comments:  No specialty comments available.  PMFS History: Patient Active Problem List   Diagnosis Date Noted  . Vitamin D deficiency 07/25/2016  . Diabetic ulcer of  right heel associated with type 2 diabetes mellitus, limited to breakdown of skin (HCC) 05/12/2016  . Osteomyelitis of ankle or foot   . 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 Right foot ulcer, limited to breakdown of skin (HCC) 04/26/2016  . Hypertension 04/26/2016  . DM (diabetes mellitus) type II controlled with renal manifestation (HCC) 04/26/2016  . Fibromyalgia 04/26/2016  . Diabetic polyneuropathy (HCC) 04/26/2016   Past Medical History:  Diagnosis Date  . Anxiety   . Arthritis    "everywhere" (04/26/2016)  . Childhood asthma   . Chronic lower back pain   . Claustrophobia   . Complication of anesthesia    "had metallic taste in my mouth for a year after one of my ORs; next OR they changed some stuff; no problems since" (04/26/2016)  . Depression   . Diabetic polyneuropathy (HCC)   . Family history of adverse reaction to anesthesia    "mom; don't have an idea what it was" (04/26/2016)  . Fibromyalgia   . GERD (gastroesophageal reflux disease)   . Headache    "related to stress" (04/26/2016)  . Hypertension   . Manic, depressive (HCC)    "severe" (04/26/2016)  . Pneumonia    "long time ago" (04/26/2016)  . Sickle cell trait (HCC)   . Sleep apnea    "mask ordered; too claustrophobic to wear" (04/26/2016)  . Type II diabetes mellitus (HCC)     History reviewed. No pertinent family history.  Past Surgical History:  Procedure Laterality Date  . ABDOMINAL HYSTERECTOMY     "partial"  . APPENDECTOMY    . CALCANEAL OSTEOTOMY Left 04/28/2016   Procedure: CALCANEAL OSTEOTOMY;  Surgeon: Nadara Mustard, MD;  Location: [facility] OR;  Service: Orthopedics;  Laterality: Left;  . CATARACT EXTRACTION W/ INTRAOCULAR LENS  IMPLANT, BILATERAL Bilateral   . FOOT SURGERY Left 2015  . FOOT SURGERY Right 04/2014   subtalar joint arthrodesis, talonavicualr joint arthrodesis, hammertoe repair 2,4,5 right  foot  . FRACTURE SURGERY    . LAPAROSCOPIC CHOLECYSTECTOMY    . OPEN REDUCTION INTERNAL FIXATION (ORIF) TIBIA/FIBULA FRACTURE Left   . TOE AMPUTATION Bilateral    toe hallux   . TONSILLECTOMY AND ADENOIDECTOMY     Social History   Occupational History  . Not on file.   Social History Main Topics  . Smoking status: Never Smoker  . Smokeless tobacco: Never Used  . Alcohol use No  . Drug use: No  . Sexual activity: No

## 2016-09-21 ENCOUNTER — Other Ambulatory Visit: Payer: Self-pay | Admitting: Internal Medicine

## 2016-09-21 ENCOUNTER — Telehealth: Payer: Self-pay | Admitting: *Deleted

## 2016-09-21 ENCOUNTER — Ambulatory Visit: Payer: Medicare PPO | Attending: Internal Medicine | Admitting: *Deleted

## 2016-09-21 VITALS — BP 132/80 | HR 115

## 2016-09-21 DIAGNOSIS — I10 Essential (primary) hypertension: Secondary | ICD-10-CM

## 2016-09-21 DIAGNOSIS — Z0130 Encounter for examination of blood pressure without abnormal findings: Secondary | ICD-10-CM | POA: Diagnosis not present

## 2016-09-21 MED ORDER — INSULIN ASPART 100 UNIT/ML FLEXPEN: 1.0000 [IU] | PEN_INJECTOR | Freq: Three times a day (TID) | SUBCUTANEOUS | 2 refills | Status: AC

## 2016-09-21 MED ORDER — GLUCOSE BLOOD VI STRP: ORAL_STRIP | 12 refills | Status: DC

## 2016-09-21 MED ORDER — INSULIN PEN NEEDLE 32G X 4 MM MISC: 1.0000 | Freq: Every day | 2 refills | Status: AC

## 2016-09-21 NOTE — Telephone Encounter (Signed)
Now taking only Novolog. At pharmacy they gave her a vial. She prefers to use insulin pen.  Request that change for this. She states she will also need caps for pen and Embrace test strips.  Please advise

## 2016-09-21 NOTE — Progress Notes (Signed)
[facility] and oriented and in good spirits. Last OV  09/06/2016 with Dr. Julien NordmannLangeland.   Pt denies chest pain, SOB, HA, dizziness, or blurred vision.  Verified medication.  She states she is not sure if she is taking Cozaar 50 mg dose. She has not picked up prescription from pharmacy since last OV. However she states in the later part of visit, she admits that she went to pharmacy to pick up insulin and was only in vial form. She prefers to get this in pen form.  Pt was informed to take 2 Cozaar 25 mg tablets to equal current dose prescribed until able to pick up medication.  Pt verified understanding.   Pt states medication was taken this morning.  Manual blood pressure reading: 132/80 Will route nurse visit to PCP: Dr. Julien NordmannLangeland.

## 2016-09-21 NOTE — Progress Notes (Unsigned)
I changed novolog to pen, renewed the ulticare needles - Not certain what she is talking about on the caps.  Needles come w/ caps.  rx for test strips, embrace brand. Sent all to [facility]. Please fu w/ pt. thanks

## 2016-09-22 ENCOUNTER — Encounter: Payer: Self-pay | Admitting: Internal Medicine

## 2016-09-23 ENCOUNTER — Encounter: Payer: Self-pay | Admitting: Internal Medicine

## 2016-09-26 ENCOUNTER — Encounter: Payer: Self-pay | Admitting: Internal Medicine

## 2016-09-26 ENCOUNTER — Other Ambulatory Visit: Payer: Self-pay | Admitting: Pharmacist

## 2016-09-26 LAB — HM DIABETES EYE EXAM

## 2016-09-26 MED ORDER — GLUCOSE BLOOD VI STRP: ORAL_STRIP | 5 refills | Status: AC

## 2016-09-27 ENCOUNTER — Encounter (INDEPENDENT_AMBULATORY_CARE_PROVIDER_SITE_OTHER): Payer: Self-pay | Admitting: Orthopedic Surgery

## 2016-09-27 ENCOUNTER — Ambulatory Visit (INDEPENDENT_AMBULATORY_CARE_PROVIDER_SITE_OTHER): Payer: Medicare PPO | Admitting: Orthopedic Surgery

## 2016-09-27 VITALS — Ht 61.0 in | Wt 198.0 lb

## 2016-09-27 DIAGNOSIS — L97426 Non-pressure chronic ulcer of left heel and midfoot with bone involvement without evidence of necrosis: Secondary | ICD-10-CM | POA: Diagnosis not present

## 2016-09-27 DIAGNOSIS — E11621 Type 2 diabetes mellitus with foot ulcer: Secondary | ICD-10-CM | POA: Diagnosis not present

## 2016-09-27 DIAGNOSIS — L97511 Non-pressure chronic ulcer of other part of right foot limited to breakdown of skin: Secondary | ICD-10-CM

## 2016-09-27 NOTE — Progress Notes (Signed)
Office Visit Note   Patient: Diane [name]           Date of Birth: Oct 29, 1950           MRN: [id number] Visit Date: 09/27/2016              Requested by: Pete Glatter, MD [address] [place], [place] [address] PCP: Pete Glatter, MD  Chief Complaint  Patient presents with  . Right Foot - Follow-up  . Left Foot - Follow-up      HPI: Patient is a 66 year old woman diabetic insensate neuropathy status post calcaneal ostectomy December 2017 she feels that the left heel is doing better she still has to persistent Wagner grade 1 ulcers in the plantar aspect of the right foot she is an bleeding with extra-depth shoes double upright brace is with custom orthotics she complains of painful onychomycotic nails denies any cellulitis or drainage.  Assessment & Plan: Visit Diagnoses:  1. Right foot ulcer, limited to breakdown of skin (HCC)   2. Diabetic ulcer of left heel associated with type 2 diabetes mellitus, with bone involvement without evidence of necrosis (HCC)     Plan: Ulcers debrided 2 right foot recommended physical therapy for strengthening and gait training follow-up in 4 weeks  Follow-Up Instructions: Return in about 4 weeks (around 10/25/2016).   Ortho Exam  Patient is alert, oriented, no adenopathy, well-dressed, normal affect, normal respiratory effort. Patient has an antalgic gait. Examination the left heel she has some callus but the ulcer is healed there is no signs of cellulitis or infection. She has onychomycotic nails on both feet 8 and the nails were trimmed 8 without complications. Examination the right foot she has to [facility] grade 1 ulcers in the plantar aspect the right foot beneath the third metatarsal head and midfoot. After informed consent 10 blade knife was used to debride the skin and soft tissue back to healthy viable tissue. The 2 ulcers are approximately 2 cm in diameter and 1 mm deep. Patient is starting to develop a little bit of  pressure area on the dorsum of the PIP joint of the third toe right foot and recommended that she use a Band-Aid to unload pressure from this area.  Imaging: No results found.  Labs: Lab Results  Component Value Date   HGBA1C 6.4 07/12/2016   HGBA1C 7.6 (H) 04/26/2016   ESRSEDRATE 51 (H) 05/31/2016   ESRSEDRATE 116 (H) 04/26/2016   REPTSTATUS 05/01/2016 FINAL 04/26/2016   CULT NO GROWTH 5 DAYS 04/26/2016    Orders:  Orders Placed This Encounter  Procedures  . Ambulatory referral to Physical Therapy   No orders of the defined types were placed in this encounter.    Procedures: No procedures performed  Clinical Data: No additional findings.  ROS:  All other systems negative, except as noted in the HPI. Review of Systems  Objective: Vital Signs: Ht 5\' 1"  (1.549 m)   Wt 198 lb (89.8 kg)   BMI 37.41 kg/m   Specialty Comments:  No specialty comments available.  PMFS History: Patient Active Problem List   Diagnosis Date Noted  . Unilateral primary osteoarthritis, right knee 09/07/2016  . Pain in right hip 09/07/2016  . Vitamin D deficiency 07/25/2016  . Diabetic ulcer of right heel associated with type 2 diabetes mellitus, limited to breakdown of skin (HCC) 05/12/2016  . Osteomyelitis of ankle or foot   . Diabetic ulcer of left heel associated with type 2 diabetes mellitus,  with bone involvement without evidence of necrosis (HCC)   . Right foot ulcer, limited to breakdown of skin (HCC) 04/26/2016  . Hypertension 04/26/2016  . DM (diabetes mellitus) type II controlled with renal manifestation (HCC) 04/26/2016  . Fibromyalgia 04/26/2016  . Diabetic polyneuropathy (HCC) 04/26/2016   Past Medical History:  Diagnosis Date  . Anxiety   . Arthritis    "everywhere" (04/26/2016)  . Childhood asthma   . Chronic lower back pain   . Claustrophobia   . Complication of anesthesia    "had metallic taste in my mouth for a year after one of my ORs; next OR they changed  some stuff; no problems since" (04/26/2016)  . Depression   . Diabetic polyneuropathy (HCC)   . Family history of adverse reaction to anesthesia    "mom; don't have an idea what it was" (04/26/2016)  . Fibromyalgia   . GERD (gastroesophageal reflux disease)   . Headache    "related to stress" (04/26/2016)  . Hypertension   . Manic, depressive (HCC)    "severe" (04/26/2016)  . Pneumonia    "long time ago" (04/26/2016)  . Sickle cell trait (HCC)   . Sleep apnea    "mask ordered; too claustrophobic to wear" (04/26/2016)  . Type II diabetes mellitus (HCC)     No family history on file.  Past Surgical History:  Procedure Laterality Date  . ABDOMINAL HYSTERECTOMY     "partial"  . APPENDECTOMY    . CALCANEAL OSTEOTOMY Left 04/28/2016   Procedure: CALCANEAL OSTEOTOMY;  Surgeon: Nadara MustardMarcus V Duda, MD;  [facility] OR;  Service: Orthopedics;  Laterality: Left;  . CATARACT EXTRACTION W/ INTRAOCULAR LENS  IMPLANT, BILATERAL Bilateral   . FOOT SURGERY Left 2015  . FOOT SURGERY Right 04/2014   subtalar joint arthrodesis, talonavicualr joint arthrodesis, hammertoe repair 2,4,5 right foot  . FRACTURE SURGERY    . LAPAROSCOPIC CHOLECYSTECTOMY    . OPEN REDUCTION INTERNAL FIXATION (ORIF) TIBIA/FIBULA FRACTURE Left   . TOE AMPUTATION Bilateral    toe hallux   . TONSILLECTOMY AND ADENOIDECTOMY     Social History   Occupational History  . Not on file.   Social History Main Topics  . Smoking status: Never Smoker  . Smokeless tobacco: Never Used  . Alcohol use No  . Drug use: No  . Sexual activity: No

## 2016-10-25 ENCOUNTER — Encounter (INDEPENDENT_AMBULATORY_CARE_PROVIDER_SITE_OTHER): Payer: Self-pay | Admitting: Orthopedic Surgery

## 2016-10-25 ENCOUNTER — Ambulatory Visit (INDEPENDENT_AMBULATORY_CARE_PROVIDER_SITE_OTHER): Payer: Medicare PPO | Admitting: Orthopedic Surgery

## 2016-10-25 VITALS — Ht 61.0 in | Wt 198.0 lb

## 2016-10-25 DIAGNOSIS — E11621 Type 2 diabetes mellitus with foot ulcer: Secondary | ICD-10-CM

## 2016-10-25 DIAGNOSIS — M79641 Pain in right hand: Secondary | ICD-10-CM

## 2016-10-25 DIAGNOSIS — L97426 Non-pressure chronic ulcer of left heel and midfoot with bone involvement without evidence of necrosis: Secondary | ICD-10-CM | POA: Diagnosis not present

## 2016-10-25 DIAGNOSIS — L97511 Non-pressure chronic ulcer of other part of right foot limited to breakdown of skin: Secondary | ICD-10-CM

## 2016-10-25 NOTE — Progress Notes (Signed)
Office Visit Note   Patient: Diane [name]           Date of Birth: 1950-09-19           MRN: [id number] Visit Date: 10/25/2016              Requested by: Pete GlatterLangeland, Dawn T, MD No address on file PCP: Pete GlatterLangeland, Dawn T, MD  Chief Complaint  Patient presents with  . Left Foot - Follow-up    04/2016 calcaneal osteotomy  . Right Foot - Wound Check    Ulcers x 2      HPI: Patient is a 66 year old woman who presents complaining of bilateral foot pain with ulcers on the right foot 2 she states that her legs are unstable she states that she stumbles getting up from the toilet in the bathroom and has to hold onto the wall to keep from falling. Patient also complains of arthritic pain at the carpometacarpal joint of her right thumb. Patient states that her shoes orthotics and braces are several years old from [place].  Assessment & Plan: Visit Diagnoses:  1. Right foot ulcer, limited to breakdown of skin (HCC)   2. 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 Pain in right hand     Plan: She is given a prescription for [employer] for extra-depth shoes custom orthotics spacer double upright braces. Recommended paraffin bath heat for the carpal metacarpal pain in the right hand.  Follow-Up Instructions: Return in about 4 weeks (around 11/22/2016).   Ortho Exam  Patient is alert, oriented, no adenopathy, well-dressed, normal affect, normal respiratory effort. Examination patient has an antalgic gait. She has a partial calcaneal excision on the left and the skin is healed well there is no cellulitis no drainage no signs of infection there is no tenderness to palpation in her foot is plantigrade. Examination of the right foot she has 2 large insensate neuropathic ulcers. After informed consent a 10 blade knife was used to debride the skin and soft tissue back to bleeding viable granulation tissue. Silver  nitrate was used for hemostasis. The midfoot ulcer is 4 cm in diameter and 3 mm deep. The forefoot ulcer is 2 cm in diameter and 3 mm deep. There is no deep abscess no cellulitis no signs of infection. Patient is tender to palpation the carpometacarpal joint of her thumb. The first dorsal extensor compartment is nontender.  Imaging: No results found.  Labs: Lab Results  Component Value Date   HGBA1C 6.4 07/12/2016   HGBA1C 7.6 (H) 04/26/2016   ESRSEDRATE 51 (H) 05/31/2016   ESRSEDRATE 116 (H) 04/26/2016   REPTSTATUS 05/01/2016 FINAL 04/26/2016   CULT NO GROWTH 5 DAYS 04/26/2016    Orders:  No orders of the defined types were placed in this encounter.  No orders of the defined types were placed in this encounter.    Procedures: No procedures performed  Clinical Data: No additional findings.  ROS:  All other systems negative, except as noted in the HPI. Review of Systems  Objective: Vital Signs: Ht 5\' 1"  (1.549 m)   Wt 198 lb (89.8 kg)   BMI 37.41 kg/m   Specialty Comments:  No specialty comments available.  PMFS History: Patient Active Problem List   Diagnosis Date Noted  . Unilateral primary osteoarthritis, right knee 09/07/2016  . Pain in right hip 09/07/2016  . Vitamin D deficiency 07/25/2016  . Diabetic ulcer  of right heel associated with type 2 diabetes mellitus, limited to breakdown of skin (HCC) 05/12/2016  . Osteomyelitis of ankle or foot   . 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 Right foot ulcer, limited to breakdown of skin (HCC) 04/26/2016  . Hypertension 04/26/2016  . DM (diabetes mellitus) type II controlled with renal manifestation (HCC) 04/26/2016  . Fibromyalgia 04/26/2016  . Diabetic polyneuropathy (HCC) 04/26/2016   Past Medical History:  Diagnosis Date  . Anxiety   . Arthritis    "everywhere" (04/26/2016)  . Childhood asthma   . Chronic lower  back pain   . Claustrophobia   . Complication of anesthesia    "had metallic taste in my mouth for a year after one of my ORs; next OR they changed some stuff; no problems since" (04/26/2016)  . Depression   . Diabetic polyneuropathy (HCC)   . Family history of adverse reaction to anesthesia    "mom; don't have an idea what it was" (04/26/2016)  . Fibromyalgia   . GERD (gastroesophageal reflux disease)   . Headache    "related to stress" (04/26/2016)  . Hypertension   . Manic, depressive (HCC)    "severe" (04/26/2016)  . Pneumonia    "long time ago" (04/26/2016)  . Sickle cell trait (HCC)   . Sleep apnea    "mask ordered; too claustrophobic to wear" (04/26/2016)  . Type II diabetes mellitus (HCC)     No family history on file.  Past Surgical History:  Procedure Laterality Date  . ABDOMINAL HYSTERECTOMY     "partial"  . APPENDECTOMY    . CALCANEAL OSTEOTOMY Left 04/28/2016   Procedure: CALCANEAL OSTEOTOMY;  Surgeon: Nadara Mustard, MD;  Location: [facility] OR;  Service: Orthopedics;  Laterality: Left;  . CATARACT EXTRACTION W/ INTRAOCULAR LENS  IMPLANT, BILATERAL Bilateral   . FOOT SURGERY Left 2015  . FOOT SURGERY Right 04/2014   subtalar joint arthrodesis, talonavicualr joint arthrodesis, hammertoe repair 2,4,5 right foot  . FRACTURE SURGERY    . LAPAROSCOPIC CHOLECYSTECTOMY    . OPEN REDUCTION INTERNAL FIXATION (ORIF) TIBIA/FIBULA FRACTURE Left   . TOE AMPUTATION Bilateral    toe hallux   . TONSILLECTOMY AND ADENOIDECTOMY     Social History   Occupational History  . Not on file.   Social History Main Topics  . Smoking status: Never Smoker  . Smokeless tobacco: Never Used  . Alcohol use No  . Drug use: No  . Sexual activity: No

## 2016-11-10 ENCOUNTER — Telehealth (INDEPENDENT_AMBULATORY_CARE_PROVIDER_SITE_OTHER): Payer: Self-pay | Admitting: Orthopedic Surgery

## 2016-11-10 NOTE — Telephone Encounter (Signed)
Patient called advised she spoke with Dr. Faith Rogueoss Watson and was advised she needed a Rx faxed for custom molded shoes for her Lt [place] foot deformity. Rt + Lt caliper plate. The fax # for Dr. Claudette LawsWatson is [phone]  The ph# is [phone]. The number to contact patient is [phone]

## 2016-11-11 NOTE — Telephone Encounter (Signed)
Faxed prescription to [phone].I called patient to make her aware.

## 2016-11-21 ENCOUNTER — Ambulatory Visit (HOSPITAL_COMMUNITY): Payer: Medicare PPO | Admitting: Psychiatry

## 2016-11-28 ENCOUNTER — Ambulatory Visit (INDEPENDENT_AMBULATORY_CARE_PROVIDER_SITE_OTHER): Payer: Medicare PPO | Admitting: Orthopedic Surgery

## 2017-01-24 ENCOUNTER — Ambulatory Visit (HOSPITAL_COMMUNITY): Payer: Medicare PPO | Admitting: Psychiatry

## 2017-01-26 ENCOUNTER — Ambulatory Visit (INDEPENDENT_AMBULATORY_CARE_PROVIDER_SITE_OTHER): Payer: Medicare PPO | Admitting: Orthopedic Surgery

## 2017-01-26 DIAGNOSIS — R269 Unspecified abnormalities of gait and mobility: Secondary | ICD-10-CM

## 2017-01-26 DIAGNOSIS — L97511 Non-pressure chronic ulcer of other part of right foot limited to breakdown of skin: Secondary | ICD-10-CM

## 2017-01-26 NOTE — Progress Notes (Signed)
Office Visit Note   Patient: Diane [name]           Date of Birth: 22-Jun-1950           MRN: [id number] Visit Date: 01/26/2017              Requested by: Pete Glatter, MD No address on file PCP: Pete Glatter, MD  No chief complaint on file.     HPI: Patient is a 66 year old woman presents in follow-up for her right lower extremity. Patient has a Wagner grade 1 ulcer which she states is becoming more painful. Patient states that she has had multiple episodes of losing her balance and falling she states her feet feel like a rocking chair and she is fallen backwards 3 times and forward one time. She states she was asked to stabilize or self holding against the wall or her locking rolling walker.  Assessment & Plan: Visit Diagnoses:  1. Right foot ulcer, limited to breakdown of skin (HCC)   2. Gait abnormality     Plan: We will set her up with physical therapy at [facility] to see if there is any therapy they can help improve her balance. Discussed that she may need a motorized scooter or wheelchair. Reevaluation of the wound on the right foot at follow-up. Continue with her extra-depth shoes custom orthotics and double upright brace is bilaterally.  Follow-Up Instructions: Return in about 3 weeks (around 02/16/2017).   Ortho Exam  Patient is alert, oriented, no adenopathy, well-dressed, normal affect, normal respiratory effort. Examination patient does have an unsteady gait. She is using a rolling locking walker. Examination is no venous ulcer she does have a Wagner grade 1 ulcer beneath the second and third metatarsal heads she is status post great toe amputation her foot is plantar grade there is no redness no cellulitis no odor no drainage. After informed consent a 10 blade knife was used to debride the skin and soft tissue back to healthy viable granulation tissue the ulcer is 2 cm in diameter 3 mm deep. There is no signs of abscess or infection no exposed tendon or  bone.  Imaging: No results found. No images are attached to the encounter.  Labs: Lab Results  Component Value Date   HGBA1C 6.4 07/12/2016   HGBA1C 7.6 (H) 04/26/2016   ESRSEDRATE 51 (H) 05/31/2016   ESRSEDRATE 116 (H) 04/26/2016   REPTSTATUS 05/01/2016 FINAL 04/26/2016   CULT NO GROWTH 5 DAYS 04/26/2016    Orders:  Orders Placed This Encounter  Procedures  . Ambulatory referral to Physical Therapy   No orders of the defined types were placed in this encounter.    Procedures: No procedures performed  Clinical Data: No additional findings.  ROS:  All other systems negative, except as noted in the HPI. Review of Systems  Objective: Vital Signs: There were no vitals taken for this visit.  Specialty Comments:  No specialty comments available.  PMFS History: Patient Active Problem List   Diagnosis Date Noted  . Unilateral primary osteoarthritis, right knee 09/07/2016  . Pain in right hip 09/07/2016  . Vitamin D deficiency 07/25/2016  . Diabetic ulcer of right heel associated with type 2 diabetes mellitus, limited to breakdown of skin (HCC) 05/12/2016  . Osteomyelitis of ankle or foot   . 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 Right foot ulcer, limited to breakdown of skin (HCC)  04/26/2016  . Hypertension 04/26/2016  . DM (diabetes mellitus) type II controlled with renal manifestation (HCC) 04/26/2016  . Fibromyalgia 04/26/2016  . Diabetic polyneuropathy (HCC) 04/26/2016   Past Medical History:  Diagnosis Date  . Anxiety   . Arthritis    "everywhere" (04/26/2016)  . Childhood asthma   . Chronic lower back pain   . Claustrophobia   . Complication of anesthesia    "had metallic taste in my mouth for a year after one of my ORs; next OR they changed some stuff; no problems since" (04/26/2016)  . Depression   . Diabetic polyneuropathy (HCC)   . Family history of  adverse reaction to anesthesia    "mom; don't have an idea what it was" (04/26/2016)  . Fibromyalgia   . GERD (gastroesophageal reflux disease)   . Headache    "related to stress" (04/26/2016)  . Hypertension   . Manic, depressive (HCC)    "severe" (04/26/2016)  . Pneumonia    "long time ago" (04/26/2016)  . Sickle cell trait (HCC)   . Sleep apnea    "mask ordered; too claustrophobic to wear" (04/26/2016)  . Type II diabetes mellitus (HCC)     No family history on file.  Past Surgical History:  Procedure Laterality Date  . ABDOMINAL HYSTERECTOMY     "partial"  . APPENDECTOMY    . CALCANEAL OSTEOTOMY Left 04/28/2016   Procedure: CALCANEAL OSTEOTOMY;  Surgeon: Nadara Mustard, MD;  Location: [facility] OR;  Service: Orthopedics;  Laterality: Left;  . CATARACT EXTRACTION W/ INTRAOCULAR LENS  IMPLANT, BILATERAL Bilateral   . FOOT SURGERY Left 2015  . FOOT SURGERY Right 04/2014   subtalar joint arthrodesis, talonavicualr joint arthrodesis, hammertoe repair 2,4,5 right foot  . FRACTURE SURGERY    . LAPAROSCOPIC CHOLECYSTECTOMY    . OPEN REDUCTION INTERNAL FIXATION (ORIF) TIBIA/FIBULA FRACTURE Left   . TOE AMPUTATION Bilateral    toe hallux   . TONSILLECTOMY AND ADENOIDECTOMY     Social History   Occupational History  . Not on file.   Social History Main Topics  . Smoking status: Never Smoker  . Smokeless tobacco: Never Used  . Alcohol use No  . Drug use: No  . Sexual activity: No

## 2017-02-15 ENCOUNTER — Ambulatory Visit (INDEPENDENT_AMBULATORY_CARE_PROVIDER_SITE_OTHER): Payer: Medicare PPO | Admitting: Orthopedic Surgery

## 2017-02-16 ENCOUNTER — Ambulatory Visit (INDEPENDENT_AMBULATORY_CARE_PROVIDER_SITE_OTHER): Payer: Medicare PPO | Admitting: Psychiatry

## 2017-02-16 ENCOUNTER — Encounter (HOSPITAL_COMMUNITY): Payer: Self-pay | Admitting: Psychiatry

## 2017-02-16 VITALS — BP 130/80 | HR 125 | Ht 61.0 in | Wt 211.8 lb

## 2017-02-16 DIAGNOSIS — F331 Major depressive disorder, recurrent, moderate: Secondary | ICD-10-CM

## 2017-02-16 DIAGNOSIS — R454 Irritability and anger: Secondary | ICD-10-CM

## 2017-02-16 DIAGNOSIS — R4584 Anhedonia: Secondary | ICD-10-CM

## 2017-02-16 DIAGNOSIS — R451 Restlessness and agitation: Secondary | ICD-10-CM | POA: Diagnosis not present

## 2017-02-16 DIAGNOSIS — G4700 Insomnia, unspecified: Secondary | ICD-10-CM

## 2017-02-16 DIAGNOSIS — R5383 Other fatigue: Secondary | ICD-10-CM

## 2017-02-16 DIAGNOSIS — R440 Auditory hallucinations: Secondary | ICD-10-CM | POA: Diagnosis not present

## 2017-02-16 DIAGNOSIS — F411 Generalized anxiety disorder: Secondary | ICD-10-CM | POA: Diagnosis not present

## 2017-02-16 DIAGNOSIS — R413 Other amnesia: Secondary | ICD-10-CM

## 2017-02-16 MED ORDER — CLONAZEPAM 0.5 MG PO TABS: 0.5000 mg | ORAL_TABLET | Freq: Every morning | ORAL | 0 refills | Status: DC

## 2017-02-16 MED ORDER — LAMOTRIGINE 25 MG PO TABS: ORAL_TABLET | ORAL | 0 refills | Status: DC
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
## 2017-02-21 ENCOUNTER — Ambulatory Visit (INDEPENDENT_AMBULATORY_CARE_PROVIDER_SITE_OTHER): Payer: Medicare PPO | Admitting: Orthopedic Surgery

## 2017-02-21 ENCOUNTER — Encounter (INDEPENDENT_AMBULATORY_CARE_PROVIDER_SITE_OTHER): Payer: Self-pay | Admitting: Orthopedic Surgery

## 2017-02-21 DIAGNOSIS — E11610 Type 2 diabetes mellitus with diabetic neuropathic arthropathy: Secondary | ICD-10-CM | POA: Diagnosis not present

## 2017-02-21 NOTE — Progress Notes (Signed)
Office Visit Note   Patient: Diane [name]           Date of Birth: July 24, 1950           MRN: [id number] Visit Date: 02/21/2017              Requested by: Pete Glatter, MD No address on file PCP: Pete Glatter, MD  Chief Complaint  Patient presents with  . Right Foot - Follow-up  . Left Foot - Follow-up      HPI: Patient is a 66 year old woman who presents for evaluation for both lower extremities. Patient states this past weekend she fell onto her right shoulder and right side. She states that after her fall she did have blood in her stools for about 2 days and this went away. Discussed the importance of patient following up with her primary care physician for workup of blood in the stool. Discussed that this is not normal. Patient states that she may need a mobility scooter due to her recent falls. Patient states she has difficulty in the room she is running she states that the fluid is being changed on her and she wants help getting up and down the stairs.  Assessment & Plan: Visit Diagnoses:  1. Charcot foot due to diabetes mellitus [facility])     Plan: patient states that she would like to try to get him to skilled nursing. She can call facilities to get in on her own or discussed that we could get her into skilled nursing if she was admitted through the hospital. Patient will reevaluate in 4 weeks for possible debridement of ulcers possible trimming of her nails.  Follow-Up Instructions: Return in about 4 weeks (around 03/21/2017).   Ortho Exam  Patient is alert, oriented, no adenopathy, well-dressed, normal affect, normal respiratory effort. Examination patient has an antalgic gait. She has increasing valgus deformity to the left Charcot foot there is no redness no cellulitis no signs of infection there were no ischemic changes in either foot. She does have some mild callus on the plantar aspect of both feet but no open ulcers at this time. Patient does not have a  palpable pulse most likely due to the swelling.  Imaging: No results found. No images are attached to the encounter.  Labs: Lab Results  Component Value Date   HGBA1C 6.4 07/12/2016   HGBA1C 7.6 (H) 04/26/2016   ESRSEDRATE 51 (H) 05/31/2016   ESRSEDRATE 116 (H) 04/26/2016   REPTSTATUS 05/01/2016 FINAL 04/26/2016   CULT NO GROWTH 5 DAYS 04/26/2016    Orders:  No orders of the defined types were placed in this encounter.  No orders of the defined types were placed in this encounter.    Procedures: No procedures performed  Clinical Data: No additional findings.  ROS:  All other systems negative, except as noted in the HPI. Review of Systems  Objective: Vital Signs: There were no vitals taken for this visit.  Specialty Comments:  No specialty comments available.  PMFS History: Patient Active Problem List   Diagnosis Date Noted  . Charcot foot due to diabetes mellitus (HCC) 02/21/2017  . Unilateral primary osteoarthritis, right knee 09/07/2016  . Pain in right hip 09/07/2016  . Vitamin D deficiency 07/25/2016  . Diabetic ulcer of right heel associated with type 2 diabetes mellitus, limited to breakdown of skin (HCC) 05/12/2016  . Osteomyelitis of ankle or foot   . Diabetic ulcer of left heel associated with type 2 diabetes mellitus,  with bone involvement without evidence of necrosis (HCC)   . Right foot ulcer, limited to breakdown of skin (HCC) 04/26/2016  . Hypertension 04/26/2016  . DM (diabetes mellitus) type II controlled with renal manifestation (HCC) 04/26/2016  . Fibromyalgia 04/26/2016  . Diabetic polyneuropathy (HCC) 04/26/2016   Past Medical History:  Diagnosis Date  . Anxiety   . Arthritis    "everywhere" (04/26/2016)  . Childhood asthma   . Chronic lower back pain   . Claustrophobia   . Complication of anesthesia    "had metallic taste in my mouth for a year after one of my ORs; next OR they changed some stuff; no problems since" (04/26/2016)    . Depression   . Diabetic polyneuropathy (HCC)   . Family history of adverse reaction to anesthesia    "mom; don't have an idea what it was" (04/26/2016)  . Fibromyalgia   . GERD (gastroesophageal reflux disease)   . Headache    "related to stress" (04/26/2016)  . Hypertension   . Manic, depressive (HCC)    "severe" (04/26/2016)  . Pneumonia    "long time ago" (04/26/2016)  . Sickle cell trait (HCC)   . Sleep apnea    "mask ordered; too claustrophobic to wear" (04/26/2016)  . Type II diabetes mellitus (HCC)     History reviewed. No pertinent family history.  Past Surgical History:  Procedure Laterality Date  . ABDOMINAL HYSTERECTOMY     "partial"  . APPENDECTOMY    . CALCANEAL OSTEOTOMY Left 04/28/2016   Procedure: CALCANEAL OSTEOTOMY;  Surgeon: Nadara Mustard, MD;  [facility] OR;  Service: Orthopedics;  Laterality: Left;  . CATARACT EXTRACTION W/ INTRAOCULAR LENS  IMPLANT, BILATERAL Bilateral   . FOOT SURGERY Left 2015  . FOOT SURGERY Right 04/2014   subtalar joint arthrodesis, talonavicualr joint arthrodesis, hammertoe repair 2,4,5 right foot  . FRACTURE SURGERY    . LAPAROSCOPIC CHOLECYSTECTOMY    . OPEN REDUCTION INTERNAL FIXATION (ORIF) TIBIA/FIBULA FRACTURE Left   . TOE AMPUTATION Bilateral    toe hallux   . TONSILLECTOMY AND ADENOIDECTOMY     Social History   Occupational History  . Not on file.   Social History Main Topics  . Smoking status: Never Smoker  . Smokeless tobacco: Never Used  . Alcohol use No  . Drug use: No  . Sexual activity: No

## 2017-03-14 ENCOUNTER — Encounter (HOSPITAL_COMMUNITY): Payer: Self-pay | Admitting: Licensed Clinical Social Worker

## 2017-03-14 ENCOUNTER — Ambulatory Visit (INDEPENDENT_AMBULATORY_CARE_PROVIDER_SITE_OTHER): Payer: Medicare PPO | Admitting: Licensed Clinical Social Worker

## 2017-03-14 DIAGNOSIS — F315 Bipolar disorder, current episode depressed, severe, with psychotic features: Secondary | ICD-10-CM | POA: Diagnosis not present

## 2017-03-14 NOTE — Progress Notes (Signed)
   THERAPIST PROGRESS NOTE  Session Time: 1:30pm-2:30pm  Participation Level: Active  Behavioral Response: DisheveledAlertDepressed and Irritable  Type of Therapy: Individual Therapy  Treatment Goals addressed: Diagnosis: Bipolar Disorder, current episode depressed, severe, with psychotic features  Interventions: CBT and Motivational Interviewing  Summary: Diane [name] is a 66 y.o. female who presents with Bipolar Disorder, current episode depressed, severe, with psychotic features.   Suicidal/Homicidal: Nowithout intent/plan  Therapist Response: [name]. She was easily distracted throughout session and displayed racing thoughts and a stream of consciousness in her discussion of symptoms and current life. [name] reports feeling very depressed due to current living situation. However, she reports no efforts to make any changes due to her disabilities. She reports due to many different physical ailments, she is unable to care for herself completely. She also feels very depressed and experiences auditory hallucinations. She reports having a previous diagnosis of Manic Depression from when she lived in [place]. She also has an unresolved trauma history, but does not meet criteria for PTSD. This will continue to be assessed.   Plan: Return again in 2-3 weeks.  Diagnosis: Axis I: Bipolar Disorder, current episode depressed, severe, with psychotic features.       Chryl HeckJessica R South Van HornSchlosberg, LCSW 03/14/2017
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
## 2017-03-17 ENCOUNTER — Ambulatory Visit (INDEPENDENT_AMBULATORY_CARE_PROVIDER_SITE_OTHER): Payer: Medicare PPO | Admitting: Psychiatry

## 2017-03-17 ENCOUNTER — Encounter (HOSPITAL_COMMUNITY): Payer: Self-pay | Admitting: Psychiatry

## 2017-03-17 DIAGNOSIS — F331 Major depressive disorder, recurrent, moderate: Secondary | ICD-10-CM | POA: Diagnosis not present

## 2017-03-17 DIAGNOSIS — M25579 Pain in unspecified ankle and joints of unspecified foot: Secondary | ICD-10-CM | POA: Diagnosis not present

## 2017-03-17 DIAGNOSIS — G629 Polyneuropathy, unspecified: Secondary | ICD-10-CM

## 2017-03-17 DIAGNOSIS — M2550 Pain in unspecified joint: Secondary | ICD-10-CM | POA: Diagnosis not present

## 2017-03-17 DIAGNOSIS — M549 Dorsalgia, unspecified: Secondary | ICD-10-CM | POA: Diagnosis not present

## 2017-03-17 DIAGNOSIS — G8929 Other chronic pain: Secondary | ICD-10-CM

## 2017-03-17 DIAGNOSIS — K589 Irritable bowel syndrome without diarrhea: Secondary | ICD-10-CM

## 2017-03-17 DIAGNOSIS — R51 Headache: Secondary | ICD-10-CM

## 2017-03-17 DIAGNOSIS — F411 Generalized anxiety disorder: Secondary | ICD-10-CM

## 2017-03-17 MED ORDER — CLONAZEPAM 0.5 MG PO TABS: 0.5000 mg | ORAL_TABLET | Freq: Every morning | ORAL | 0 refills | Status: DC

## 2017-03-17 MED ORDER — LAMOTRIGINE 25 MG PO TABS: ORAL_TABLET | ORAL | 0 refills | Status: DC
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
## 2017-03-21 ENCOUNTER — Ambulatory Visit (INDEPENDENT_AMBULATORY_CARE_PROVIDER_SITE_OTHER): Payer: Medicare PPO | Admitting: Orthopedic Surgery

## 2017-04-18 ENCOUNTER — Encounter (HOSPITAL_COMMUNITY): Payer: Self-pay | Admitting: Psychiatry

## 2017-04-18 ENCOUNTER — Ambulatory Visit (INDEPENDENT_AMBULATORY_CARE_PROVIDER_SITE_OTHER): Payer: Medicare PPO | Admitting: Psychiatry

## 2017-04-18 VITALS — BP 142/80 | HR 107 | Ht 60.0 in | Wt 220.0 lb

## 2017-04-18 DIAGNOSIS — F331 Major depressive disorder, recurrent, moderate: Secondary | ICD-10-CM

## 2017-04-18 DIAGNOSIS — R450 Nervousness: Secondary | ICD-10-CM | POA: Diagnosis not present

## 2017-04-18 DIAGNOSIS — F411 Generalized anxiety disorder: Secondary | ICD-10-CM

## 2017-04-18 DIAGNOSIS — F4310 Post-traumatic stress disorder, unspecified: Secondary | ICD-10-CM | POA: Diagnosis not present

## 2017-04-18 DIAGNOSIS — M549 Dorsalgia, unspecified: Secondary | ICD-10-CM | POA: Diagnosis not present

## 2017-04-18 DIAGNOSIS — F315 Bipolar disorder, current episode depressed, severe, with psychotic features: Secondary | ICD-10-CM | POA: Diagnosis not present

## 2017-04-18 DIAGNOSIS — M2550 Pain in unspecified joint: Secondary | ICD-10-CM | POA: Diagnosis not present

## 2017-04-18 DIAGNOSIS — G4700 Insomnia, unspecified: Secondary | ICD-10-CM

## 2017-04-18 MED ORDER — LAMOTRIGINE 100 MG PO TABS: 100.0000 mg | ORAL_TABLET | Freq: Every day | ORAL | 1 refills | Status: DC

## 2017-04-18 MED ORDER — CHLORPROMAZINE HCL 10 MG PO TABS: 10.0000 mg | ORAL_TABLET | Freq: Three times a day (TID) | ORAL | 1 refills | Status: DC

## 2017-04-18 MED ORDER — CLONAZEPAM 0.5 MG PO TABS: 0.5000 mg | ORAL_TABLET | Freq: Every morning | ORAL | 1 refills | Status: DC

## 2017-04-18 MED ORDER — CHLORPROMAZINE HCL 10 MG PO TABS: 10.0000 mg | ORAL_TABLET | Freq: Every day | ORAL | 1 refills | Status: DC
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
## 2017-04-19 ENCOUNTER — Ambulatory Visit (HOSPITAL_COMMUNITY): Payer: Self-pay | Admitting: Licensed Clinical Social Worker

## 2017-04-20 ENCOUNTER — Ambulatory Visit (INDEPENDENT_AMBULATORY_CARE_PROVIDER_SITE_OTHER): Payer: Medicare PPO | Admitting: Orthopedic Surgery

## 2017-04-20 ENCOUNTER — Ambulatory Visit (INDEPENDENT_AMBULATORY_CARE_PROVIDER_SITE_OTHER): Payer: Medicare PPO

## 2017-04-20 ENCOUNTER — Ambulatory Visit (INDEPENDENT_AMBULATORY_CARE_PROVIDER_SITE_OTHER): Payer: Self-pay

## 2017-04-20 ENCOUNTER — Encounter (INDEPENDENT_AMBULATORY_CARE_PROVIDER_SITE_OTHER): Payer: Self-pay | Admitting: Orthopedic Surgery

## 2017-04-20 DIAGNOSIS — L97511 Non-pressure chronic ulcer of other part of right foot limited to breakdown of skin: Secondary | ICD-10-CM

## 2017-04-20 DIAGNOSIS — M79605 Pain in left leg: Secondary | ICD-10-CM | POA: Diagnosis not present

## 2017-04-20 DIAGNOSIS — M7541 Impingement syndrome of right shoulder: Secondary | ICD-10-CM | POA: Diagnosis not present

## 2017-04-20 DIAGNOSIS — E11610 Type 2 diabetes mellitus with diabetic neuropathic arthropathy: Secondary | ICD-10-CM | POA: Diagnosis not present

## 2017-04-20 NOTE — Progress Notes (Signed)
Office Visit Note   Patient: Diane [name]           Date of Birth: [birth year]           MRN: [id number] Visit Date: 04/20/2017              Requested by: Pete GlatterLangeland, Dawn T, MD No address on file PCP: Pete GlatterLangeland, Dawn T, MD (Inactive)  Chief Complaint  Patient presents with  . Right Foot - Follow-up  . Left Foot - Follow-up      HPI: Patient is a 66 year old woman with diabetic insensate neuropathy who presents complaining of left tibial shaft pain.  She also complains of right shoulder pain.  She states she is status post multiple surgical interventions for stabilization of a dislocating right shoulder.  Patient also complains of ulceration on the plantar aspect of her left foot.  Assessment & Plan: Visit Diagnoses:  1. Impingement syndrome of right shoulder   2. Pain in left leg   3. Charcot foot due to diabetes mellitus (HCC)   4. Right foot ulcer, limited to breakdown of skin (HCC)     Plan: Ulcer was debrided of skin and soft tissue left foot.  Recommend patient follow-up with a primary care physician due to problems with her balance.  Discussed that if her blood pressure and medical workup is normal she could benefit from physical therapy.  Follow-Up Instructions: Return if symptoms worsen or fail to improve.   Ortho Exam  Patient is alert, oriented, no adenopathy, well-dressed, normal affect, normal respiratory effort. Examination patient has an antalgic gait she uses a rolling walker she does have problems with balance.  Examination the left leg there are some bony prominences over the medial aspect of the tibia there is no open ulcers.  She has 2 Waggoner grade 1 ulcer of the plantar aspect left foot.  After informed consent a 10 blade knife was used to debride the skin and soft tissue back to healthy viable tissue.  The heel ulcer was 2 cm in diameter 1 mm deep the forefoot ulcer was 10 mm in diameter 1 mm deep there is no exposed bone or tendon there is no  drainage no odor no cellulitis.  Examination of the right shoulder she has range of motion with abduction flexion to 120 degrees she has some pain with Neer and Hawkins impingement test.  She does not have full range of motion there is crepitation with range of motion.  Imaging: Xr Tibia/fibula Left  Result Date: 04/20/2017 2 view radiographs of the left tibia show stable internal fixation of a previous tibial fracture no hardware failure the ankle as well fused with no complicating features.  She is status post resection of the distal fibula.  Xr Shoulder Right  Result Date: 04/20/2017 2 view radiographs of the right shoulder shows a congruent glenohumeral joint there is degenerative arthritis and calcification of the anterior aspect of the rotator cuff.  No images are attached to the encounter.  Labs: Lab Results  Component Value Date   HGBA1C 6.4 07/12/2016   HGBA1C 7.6 (H) 04/26/2016   ESRSEDRATE 51 (H) 05/31/2016   ESRSEDRATE 116 (H) 04/26/2016   REPTSTATUS 05/01/2016 FINAL 04/26/2016   CULT NO GROWTH 5 DAYS 04/26/2016    @LABSALLVALUES (HGBA1)@  There is no height or weight on file to calculate BMI.  Orders:  Orders Placed This Encounter  Procedures  . XR Shoulder Right  . XR Tibia/Fibula Left   No orders of the defined  types were placed in this encounter.    Procedures: No procedures performed  Clinical Data: No additional findings.  ROS:  All other systems negative, except as noted in the HPI. Review of Systems  Objective: Vital Signs: There were no vitals taken for this visit.  Specialty Comments:  No specialty comments available.  PMFS History: Patient Active Problem List   Diagnosis Date Noted  . Pain in left leg 04/20/2017  . Charcot foot due to diabetes mellitus (HCC) 02/21/2017  . Unilateral primary osteoarthritis, right knee 09/07/2016  . Pain in right hip 09/07/2016  . Vitamin D deficiency 07/25/2016  . Diabetic ulcer of right heel  associated with type 2 diabetes mellitus, limited to breakdown of skin (HCC) 05/12/2016  . Osteomyelitis of ankle or foot   . 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 Right foot ulcer, limited to breakdown of skin (HCC) 04/26/2016  . Hypertension 04/26/2016  . DM (diabetes mellitus) type II controlled with renal manifestation (HCC) 04/26/2016  . Fibromyalgia 04/26/2016  . Diabetic polyneuropathy (HCC) 04/26/2016   Past Medical History:  Diagnosis Date  . Anxiety   . Arthritis    "everywhere" (04/26/2016)  . Childhood asthma   . Chronic lower back pain   . Claustrophobia   . Complication of anesthesia    "had metallic taste in my mouth for a year after one of my ORs; next OR they changed some stuff; no problems since" (04/26/2016)  . Depression   . Diabetic polyneuropathy (HCC)   . Family history of adverse reaction to anesthesia    "mom; don't have an idea what it was" (04/26/2016)  . Fibromyalgia   . GERD (gastroesophageal reflux disease)   . Headache    "related to stress" (04/26/2016)  . Hypertension   . Manic, depressive (HCC)    "severe" (04/26/2016)  . Pneumonia    "long time ago" (04/26/2016)  . Sickle cell trait (HCC)   . Sleep apnea    "mask ordered; too claustrophobic to wear" (04/26/2016)  . Type II diabetes mellitus (HCC)     History reviewed. No pertinent family history.  Past Surgical History:  Procedure Laterality Date  . ABDOMINAL HYSTERECTOMY     "partial"  . APPENDECTOMY    . CALCANEAL OSTEOTOMY Left 04/28/2016   Procedure: CALCANEAL OSTEOTOMY;  Surgeon: Nadara MustardMarcus V Hennesy Sobalvarro, MD;  [facility];  Service: Orthopedics;  Laterality: Left;  . CATARACT EXTRACTION W/ INTRAOCULAR LENS  IMPLANT, BILATERAL Bilateral   . FOOT SURGERY Left 2015  . FOOT SURGERY Right 04/2014   subtalar joint arthrodesis, talonavicualr joint arthrodesis, hammertoe repair 2,4,5 right foot  .  FRACTURE SURGERY    . LAPAROSCOPIC CHOLECYSTECTOMY    . OPEN REDUCTION INTERNAL FIXATION (ORIF) TIBIA/FIBULA FRACTURE Left   . TOE AMPUTATION Bilateral    toe hallux   . TONSILLECTOMY AND ADENOIDECTOMY     Social History   Occupational History  . Not on file  Tobacco Use  . Smoking status: Never Smoker  . Smokeless tobacco: Never Used  Substance and Sexual Activity  . Alcohol use: No  . Drug use: No  . Sexual activity: No

## 2017-04-25 ENCOUNTER — Telehealth (HOSPITAL_COMMUNITY): Payer: Self-pay

## 2017-04-25 ENCOUNTER — Other Ambulatory Visit (HOSPITAL_COMMUNITY): Payer: Self-pay | Admitting: Psychiatry

## 2017-04-25 NOTE — Telephone Encounter (Signed)
Patient can not find the Klonopin prescription given to her on 12/11, she would like to know if you can call it in for her. Please review and advise, thank you

## 2017-04-26 ENCOUNTER — Encounter (HOSPITAL_COMMUNITY): Payer: Self-pay | Admitting: Licensed Clinical Social Worker

## 2017-04-26 ENCOUNTER — Ambulatory Visit (INDEPENDENT_AMBULATORY_CARE_PROVIDER_SITE_OTHER): Payer: Medicare PPO | Admitting: Licensed Clinical Social Worker

## 2017-04-26 DIAGNOSIS — Z598 Other problems related to housing and economic circumstances: Secondary | ICD-10-CM

## 2017-04-26 DIAGNOSIS — F315 Bipolar disorder, current episode depressed, severe, with psychotic features: Secondary | ICD-10-CM | POA: Diagnosis not present
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
## 2017-05-04 ENCOUNTER — Ambulatory Visit (HOSPITAL_COMMUNITY): Payer: Medicare PPO | Admitting: Licensed Clinical Social Worker

## 2017-05-16 ENCOUNTER — Telehealth (INDEPENDENT_AMBULATORY_CARE_PROVIDER_SITE_OTHER): Payer: Self-pay | Admitting: Orthopedic Surgery

## 2017-05-16 NOTE — Telephone Encounter (Signed)
Patient called to let Dr. Lajoyce Cornersuda know that she fell down 14 steps about a week and a half ago.  She also wanted to let him know that after running some errands yesterday, she is having trouble walking on the heel that he operated on.  It is tight and shinny.  She is now laying in bed with it elevated and have taken Gabapentin and Aleeve.  CB#[phone].  Thank you.

## 2017-05-30 ENCOUNTER — Ambulatory Visit (HOSPITAL_COMMUNITY): Payer: Self-pay | Admitting: Psychiatry

## 2017-06-01 ENCOUNTER — Ambulatory Visit: Payer: Medicare PPO | Attending: Internal Medicine | Admitting: Physician Assistant

## 2017-06-01 ENCOUNTER — Encounter (HOSPITAL_COMMUNITY): Payer: Self-pay | Admitting: Licensed Clinical Social Worker

## 2017-06-01 ENCOUNTER — Ambulatory Visit (INDEPENDENT_AMBULATORY_CARE_PROVIDER_SITE_OTHER): Payer: Medicare PPO | Admitting: Licensed Clinical Social Worker

## 2017-06-01 VITALS — BP 144/87 | HR 105 | Temp 98.7°F | Resp 16 | Ht 61.0 in | Wt 219.6 lb

## 2017-06-01 DIAGNOSIS — I10 Essential (primary) hypertension: Secondary | ICD-10-CM | POA: Insufficient documentation

## 2017-06-01 DIAGNOSIS — F315 Bipolar disorder, current episode depressed, severe, with psychotic features: Secondary | ICD-10-CM | POA: Diagnosis not present

## 2017-06-01 DIAGNOSIS — R600 Localized edema: Secondary | ICD-10-CM | POA: Diagnosis not present

## 2017-06-01 DIAGNOSIS — Z794 Long term (current) use of insulin: Secondary | ICD-10-CM

## 2017-06-01 DIAGNOSIS — W57XXXA Bitten or stung by nonvenomous insect and other nonvenomous arthropods, initial encounter: Secondary | ICD-10-CM

## 2017-06-01 DIAGNOSIS — G4730 Sleep apnea, unspecified: Secondary | ICD-10-CM | POA: Diagnosis not present

## 2017-06-01 DIAGNOSIS — M797 Fibromyalgia: Secondary | ICD-10-CM | POA: Insufficient documentation

## 2017-06-01 DIAGNOSIS — E1321 Other specified diabetes mellitus with diabetic nephropathy: Secondary | ICD-10-CM | POA: Insufficient documentation

## 2017-06-01 DIAGNOSIS — R609 Edema, unspecified: Secondary | ICD-10-CM | POA: Diagnosis not present

## 2017-06-01 DIAGNOSIS — D573 Sickle-cell trait: Secondary | ICD-10-CM | POA: Insufficient documentation

## 2017-06-01 DIAGNOSIS — E1142 Type 2 diabetes mellitus with diabetic polyneuropathy: Secondary | ICD-10-CM | POA: Insufficient documentation

## 2017-06-01 DIAGNOSIS — F319 Bipolar disorder, unspecified: Secondary | ICD-10-CM | POA: Diagnosis not present

## 2017-06-01 DIAGNOSIS — Z79899 Other long term (current) drug therapy: Secondary | ICD-10-CM | POA: Diagnosis not present

## 2017-06-01 DIAGNOSIS — E1129 Type 2 diabetes mellitus with other diabetic kidney complication: Secondary | ICD-10-CM | POA: Diagnosis not present

## 2017-06-01 DIAGNOSIS — F419 Anxiety disorder, unspecified: Secondary | ICD-10-CM | POA: Diagnosis not present

## 2017-06-01 DIAGNOSIS — K219 Gastro-esophageal reflux disease without esophagitis: Secondary | ICD-10-CM | POA: Diagnosis not present

## 2017-06-01 DIAGNOSIS — X58XXXA Exposure to other specified factors, initial encounter: Secondary | ICD-10-CM | POA: Insufficient documentation

## 2017-06-01 DIAGNOSIS — Z23 Encounter for immunization: Secondary | ICD-10-CM

## 2017-06-01 LAB — POCT GLYCOSYLATED HEMOGLOBIN (HGB A1C): Hemoglobin A1C: 7.2

## 2017-06-01 LAB — GLUCOSE, POCT (MANUAL RESULT ENTRY): POC Glucose: 132 mg/dl — AB (ref 70–99)

## 2017-06-01 MED ORDER — FUROSEMIDE 20 MG PO TABS
20.0000 mg | ORAL_TABLET | Freq: Every day | ORAL | 3 refills | Status: DC
Start: 2017-06-01 — End: 2017-08-21

## 2017-06-01 MED ORDER — TRIAMCINOLONE ACETONIDE 0.1 % EX CREA: 1.0000 "application " | TOPICAL_CREAM | Freq: Two times a day (BID) | CUTANEOUS | 0 refills | Status: AC

## 2017-06-01 MED ORDER — CETIRIZINE HCL 10 MG PO TABS: 10.0000 mg | ORAL_TABLET | Freq: Every day | ORAL | 11 refills | Status: DC

## 2017-06-01 MED ORDER — MUPIROCIN 2 % EX OINT: TOPICAL_OINTMENT | CUTANEOUS | 1 refills | Status: AC

## 2017-06-01 NOTE — Progress Notes (Signed)
Patient ID: Diane [name], female   DOB: 1950/11/30, 67 y.o.   MRN: [id number]     Diane [name], is a 67 y.o. female  OIB:[id number]  XIH:[id number]  DOB - 18-Feb-1951  Subjective:  Chief Complaint and HPI: Diane [name] is a 67 y.o. female here today for multiple issues. Bites/rash and itching on arms and all over her whole body.  She moved into a new apartment last week and started having rash and itching 2 days later.  She has been sleeping on the floor then an air mattress.  She denies pets.  No f/c. She has lived in 2 different places in the last few months.    She also c/o edema in her lower legs.  L>R.  Left leg has always swollen more than the R.  When she lived in [place], she "had to take a little water pill and wera compression stockings."  Her compression stockings are in storage.  She denies SOB or CP.  No new DOE.  Social: She is disabled and unable to work. Has financial and transportation issues with no stable living arrangements.      ROS:   Constitutional:  No f/c, No night sweats, No unexplained weight loss. EENT:  No vision changes, No blurry vision, No hearing changes. No mouth, throat, or ear problems.  Respiratory: No cough, No SOB Cardiac: No CP, no palpitations GI:  No abd pain, No N/V/D. GU: No Urinary s/sx Musculoskeletal: No joint pain Neuro: No headache, no dizziness, no motor weakness.  Skin: No rash Endocrine:  No polydipsia. No polyuria.  Psych: Denies SI/HI  No problems updated.  ALLERGIES: Allergies  Allergen Reactions  . Erythromycin Anaphylaxis  . Biaxin [Clarithromycin] Other (See Comments)  . Feldene [Piroxicam] Diarrhea, Nausea And Vomiting and Other (See Comments)    And sores in mouth  . Nsaids Other (See Comments)    Contraindicated because of her other meds  . Tetracyclines & Related   . Latex Rash  . Penicillins Rash and Other (See Comments)    Has patient had a PCN reaction causing immediate rash, facial/tongue/throat  swelling, SOB or lightheadedness with hypotension: Yes Has patient had a PCN reaction causing severe rash involving mucus membranes or skin necrosis: No Has patient had a PCN reaction that required hospitalization No Has patient had a PCN reaction occurring within the last 10 years: No If all of the above answers are "NO", then may proceed with Cephalosporin use. And sores in her mouth   . Tape Rash    PAST MEDICAL HISTORY: Past Medical History:  Diagnosis Date  . Anxiety   . Arthritis    "everywhere" (04/26/2016)  . Childhood asthma   . Chronic lower back pain   . Claustrophobia   . Complication of anesthesia    "had metallic taste in my mouth for a year after one of my ORs; next OR they changed some stuff; no problems since" (04/26/2016)  . Depression   . Diabetic polyneuropathy ([place])   . Family history of adverse reaction to anesthesia    "mom; don't have an idea what it was" (04/26/2016)  . Fibromyalgia   . GERD (gastroesophageal reflux disease)   . Headache    "related to stress" (04/26/2016)  . Hypertension   . Manic, depressive ([place])    "severe" (04/26/2016)  . Pneumonia    "long time ago" (04/26/2016)  . Sickle cell trait ([place])   . Sleep apnea    "mask ordered; too claustrophobic to wear" (  04/26/2016)  . Type II diabetes mellitus ([place])     MEDICATIONS AT HOME: Prior to Admission medications   Medication Sig Start Date End Date Taking? Authorizing Provider  amitriptyline (ELAVIL) 50 MG tablet Take 1 tablet (50 mg total) by mouth at bedtime. 05/31/16  Yes Langeland, Dawn T, MD  chlorproMAZINE (THORAZINE) 10 MG tablet Take 1 tablet (10 mg total) by mouth at bedtime. 04/18/17  Yes Arfeen, Arlyce Harman, MD  clonazePAM (KLONOPIN) 0.5 MG tablet Take 1 tablet (0.5 mg total) by mouth every morning. 04/18/17 04/18/18 Yes Arfeen, Arlyce Harman, MD  diphenhydrAMINE (BENADRYL) 25 MG tablet Take 25 mg by mouth every 6 (six) hours as needed.   Yes [provider]  famotidine  (PEPCID) 20 MG tablet Take 1 tablet (20 mg total) by mouth 2 (two) times daily. 09/06/16  Yes Langeland, Dawn T, MD  gabapentin (NEURONTIN) 300 MG capsule Take 2 capsules (600 mg total) by mouth 3 (three) times daily. 05/28/16  Yes Newt Minion, MD  lamoTRIgine (LAMICTAL) 100 MG tablet Take 1 tablet (100 mg total) by mouth daily. 04/18/17  Yes Arfeen, Arlyce Harman, MD  losartan (COZAAR) 50 MG tablet Take 1 tablet (50 mg total) by mouth daily. 09/06/16  Yes Langeland, Dawn T, MD  pravastatin (PRAVACHOL) 20 MG tablet Take 1 tablet (20 mg total) by mouth daily. 09/06/16  Yes Langeland, Dawn T, MD  Blood Glucose Monitoring Suppl (ACCU-CHEK AVIVA PLUS) w/Device KIT Use as directed for 3 times daily testing of blood glucose. E11.9 Patient not taking: Reported on 06/01/2017 09/07/16   Maren Reamer, MD  cetirizine (ZYRTEC) 10 MG tablet Take 1 tablet (10 mg total) by mouth daily. 06/01/17   Argentina Donovan, PA-C  donepezil (ARICEPT) 5 MG tablet Take 1 tablet (5 mg total) by mouth at bedtime. Patient not taking: Reported on 06/01/2017 05/13/16   Argentina Donovan, PA-C  ergocalciferol (VITAMIN D2) 50000 units capsule Take 1 capsule (50,000 Units total) by mouth once a week. Patient not taking: Reported on 06/01/2017 05/13/16   Argentina Donovan, PA-C  furosemide (LASIX) 20 MG tablet Take 1 tablet (20 mg total) by mouth daily. 06/01/17   Argentina Donovan, PA-C  glucose blood (ACCU-CHEK AVIVA PLUS) test strip Use as instructed for 3 times daily testing of blood glucose. E.11.9 Patient not taking: Reported on 06/01/2017 09/26/16   [name] Mussel T, MD  insulin aspart (NOVOLOG) 100 UNIT/ML FlexPen Inject 1 Units into the skin 3 (three) times daily with meals. Sliding scale; 12 units max per day Patient not taking: Reported on 06/01/2017 09/21/16   [name] Mussel T, MD  Insulin Pen Needle (ULTICARE MICRO PEN NEEDLES) 32G X 4 MM MISC 1 applicator by Does not apply route at bedtime. Patient not taking: Reported on 06/01/2017  09/21/16   Maren Reamer, MD  mupirocin ointment Drue Stager) 2 % Apply to any area that appears infected 2X daily 06/01/17   Argentina Donovan, PA-C  oxybutynin (DITROPAN-XL) 10 MG 24 hr tablet Take 1 tablet (10 mg total) by mouth at bedtime. Patient not taking: Reported on 06/01/2017 09/06/16   [name] Mussel T, MD  triamcinolone cream (KENALOG) 0.1 % Apply 1 application topically 2 (two) times daily. 06/01/17   Argentina Donovan, PA-C     Objective:  EXAM:   Vitals:   06/01/17 0907  BP: (!) 144/87  Pulse: (!) 105  Resp: 16  Temp: 98.7 F (37.1 C)  TempSrc: Oral  SpO2: 93%  Weight: 219  lb 9.6 oz (99.6 kg)  Height: _0  (1.549 m)    General appearance : A&OX3. NAD. Non-toxic-appearing HEENT: Atraumatic and Normocephalic.  PERRLA. EOM intact.   Neck: supple, no JVD. No cervical lymphadenopathy. No thyromegaly Chest/Lungs:  Breathing-non-labored, Good air entry bilaterally, breath sounds normal without rales, rhonchi, or wheezing  CVS: S1 S2 regular, no murmurs, gallops, rubs  Extremities: wearing braces B legs.  R leg with minimal swelling in LE and ankle/foot.  DP pulses intact.  No erythema of calf or swelling.  Neg Homans's B.  LLE with mild swelling and 1+edema.  No calf TTP or erythema.     Psych:  TP linear. J/I WNL. Normal speech. Appropriate eye contact and affect.  Skin:  Multiple excoriations in clusters and streaks that appear to be in a "biting" pattern.  Some scabbing and urticaria/hives.  No secondary cellulitis.  The bites are present along her arms/legs/abdomen.  Data Review Lab Results  Component Value Date   HGBA1C 7.2 06/01/2017   HGBA1C 6.4 07/12/2016   HGBA1C 7.6 (H) 04/26/2016     Assessment & Plan   1. 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  She declines restarting meds.  I have encouraged her to work on dietary changes to achieve better glycemic control.   She is not currently checking her blood sugars.   - HgB A1c - Glucose (CBG)  2. Immunization due - Flu Vaccine QUAD 6+ mos PF IM (Fluarix Quad PF)  3. Insect bite, initial encounter Concern for bed bugs/fela bites.  She should notify her apartment complex and have the apartment checked.   - cetirizine (ZYRTEC) 10 MG tablet; Take 1 tablet (10 mg total) by mouth daily.  Dispense: 30 tablet; Refill: 11 - triamcinolone cream (KENALOG) 0.1 %; Apply 1 application topically 2 (two) times daily.  Dispense: 30 g; Refill: 0 - mupirocin ointment (BACTROBAN) 2 %; Apply to any area that appears infected 2X daily  Dispense: 30 g; Refill: 1  4. Edema, unspecified type Mild.  No signs of fulminant failure.  No sign DVT. Take for 2 weeks then reassess.   - furosemide (LASIX) 20 MG tablet; Take 1 tablet (20 mg total) by mouth daily.  Dispense: 30 tablet; Refill: 3  Wear compression stockings  Spent >57mns face to face obtaining history then providing patient education about bites, DM, edema.   Patient have been counseled extensively about nutrition and exercise  Return in about 1 week (around 06/08/2017) for assign PCP; recheck edema and multiple other  issues.  The patient was given clear instructions to go to ER or return to medical center if symptoms don't improve, worsen or new problems develop. The patient verbalized understanding. The patient was told to call to get lab results if they haven't heard anything in the next week.     AFreeman Caldron PA-C [facility] WO[facility] Center[facility]ur[facility] NLoyal  06/01/2017, 9:53 AM

## 2017-06-01 NOTE — Progress Notes (Signed)
Patient is stating her left leg are swelling.  Patient is here for diabetes discuss and hypertension.

## 2017-06-01 NOTE — Patient Instructions (Addendum)
Have your apartment complex check for bed bugs and/or fleas or other types of insects that may be biting you.  Your A1C was higher than it should be.  You should limit sugar intake, sodas, juices, desserts, etc.     Diabetes Mellitus and Nutrition When you have diabetes (diabetes mellitus), it is very important to have healthy eating habits because your blood sugar (glucose) levels are greatly affected by what you eat and drink. Eating healthy foods in the appropriate amounts, at about the same times every day, can help you:  Control your blood glucose.  Lower your risk of heart disease.  Improve your blood pressure.  Reach or maintain a healthy weight.  Every person with diabetes is different, and each person has different needs for a meal plan. Your health care provider may recommend that you work with a diet and nutrition specialist (dietitian) to make a meal plan that is best for you. Your meal plan may vary depending on factors such as:  The calories you need.  The medicines you take.  Your weight.  Your blood glucose, blood pressure, and cholesterol levels.  Your activity level.  Other health conditions you have, such as heart or kidney disease.  How do carbohydrates affect me? Carbohydrates affect your blood glucose level more than any other type of food. Eating carbohydrates naturally increases the amount of glucose in your blood. Carbohydrate counting is a method for keeping track of how many carbohydrates you eat. Counting carbohydrates is important to keep your blood glucose at a healthy level, especially if you use insulin or take certain oral diabetes medicines. It is important to know how many carbohydrates you can safely have in each meal. This is different for every person. Your dietitian can help you calculate how many carbohydrates you should have at each meal and for snack. Foods that contain carbohydrates include:  Bread, cereal, rice, pasta, and  crackers.  Potatoes and corn.  Peas, beans, and lentils.  Milk and yogurt.  Fruit and juice.  Desserts, such as cakes, cookies, ice cream, and candy.  How does alcohol affect me? Alcohol can cause a sudden decrease in blood glucose (hypoglycemia), especially if you use insulin or take certain oral diabetes medicines. Hypoglycemia can be a life-threatening condition. Symptoms of hypoglycemia (sleepiness, dizziness, and confusion) are similar to symptoms of having too much alcohol. If your health care provider says that alcohol is safe for you, follow these guidelines:  Limit alcohol intake to no more than 1 drink per day for nonpregnant women and 2 drinks per day for men. One drink equals 12 oz of beer, 5 oz of wine, or 1 oz of hard liquor.  Do not drink on an empty stomach.  Keep yourself hydrated with water, diet soda, or unsweetened iced tea.  Keep in mind that regular soda, juice, and other mixers may contain a lot of sugar and must be counted as carbohydrates.  What are tips for following this plan? Reading food labels  Start by checking the serving size on the label. The amount of calories, carbohydrates, fats, and other nutrients listed on the label are based on one serving of the food. Many foods contain more than one serving per package.  Check the total grams (g) of carbohydrates in one serving. You can calculate the number of servings of carbohydrates in one serving by dividing the total carbohydrates by 15. For example, if a food has 30 g of total carbohydrates, it would be equal to 2  servings of carbohydrates.  Check the number of grams (g) of saturated and trans fats in one serving. Choose foods that have low or no amount of these fats.  Check the number of milligrams (mg) of sodium in one serving. Most people should limit total sodium intake to less than 2,300 mg per day.  Always check the nutrition information of foods labeled as "low-fat" or "nonfat". These foods  may be higher in added sugar or refined carbohydrates and should be avoided.  Talk to your dietitian to identify your daily goals for nutrients listed on the label. Shopping  Avoid buying canned, premade, or processed foods. These foods tend to be high in fat, sodium, and added sugar.  Shop around the outside edge of the grocery store. This includes fresh fruits and vegetables, bulk grains, fresh meats, and fresh dairy. Cooking  Use low-heat cooking methods, such as baking, instead of high-heat cooking methods like deep frying.  Cook using healthy oils, such as olive, canola, or sunflower oil.  Avoid cooking with butter, cream, or high-fat meats. Meal planning  Eat meals and snacks regularly, preferably at the same times every day. Avoid going long periods of time without eating.  Eat foods high in fiber, such as fresh fruits, vegetables, beans, and whole grains. Talk to your dietitian about how many servings of carbohydrates you can eat at each meal.  Eat 4-6 ounces of lean protein each day, such as lean meat, chicken, fish, eggs, or tofu. 1 ounce is equal to 1 ounce of meat, chicken, or fish, 1 egg, or 1/4 cup of tofu.  Eat some foods each day that contain healthy fats, such as avocado, nuts, seeds, and fish. Lifestyle   Check your blood glucose regularly.  Exercise at least 30 minutes 5 or more days each week, or as told by your health care provider.  Take medicines as told by your health care provider.  Do not use any products that contain nicotine or tobacco, such as cigarettes and e-cigarettes. If you need help quitting, ask your health care provider.  Work with a Veterinary surgeoncounselor or diabetes educator to identify strategies to manage stress and any emotional and social challenges. What are some questions to ask my health care provider?  Do I need to meet with a diabetes educator?  Do I need to meet with a dietitian?  What number can I call if I have questions?  When are the  best times to check my blood glucose? Where to find more information:  American Diabetes Association: diabetes.org/food-and-fitness/food  Academy of Nutrition and Dietetics: https://www.vargas.com/www.eatright.org/resources/health/diseases-and-conditions/diabetes  [employer] Institute of Diabetes and Digestive and Kidney Diseases (NIH): FindJewelers.czwww.niddk.nih.gov/health-information/diabetes/overview/diet-eating-physical-activity Summary  A healthy meal plan will help you control your blood glucose and maintain a healthy lifestyle.  Working with a diet and nutrition specialist (dietitian) can help you make a meal plan that is best for you.  Keep in mind that carbohydrates and alcohol have immediate effects on your blood glucose levels. It is important to count carbohydrates and to use alcohol carefully. This information is not intended to replace advice given to you by your health care provider. Make sure you discuss any questions you have with your health care provider. Document Released: 01/20/2005 Document Revised: 05/30/2016 Document Reviewed: 05/30/2016 Elsevier Interactive Patient Education  2018 [employer]      IT sales professionalnsect Bite, Adult An insect bite can make your skin red, itchy, and swollen. Some insects can spread disease to people with a bite. However, most insect bites do  not lead to disease, and most are not serious. Follow these instructions at home: Bite area care  Do not scratch the bite area.  Keep the bite area clean and dry.  Wash the bite area every day with soap and water as told by your doctor.  Check the bite area every day for signs of infection. Check for: ? More redness, swelling, or pain. ? Fluid or blood. ? Warmth. ? Pus. Managing pain, itching, and swelling  You may put any of these on the bite area as told by your doctor: ? A baking soda paste. ? Cortisone cream. ? Calamine lotion.  If directed, put ice on the bite area. ? Put ice in a plastic bag. ? Place a towel between  your skin and the bag. ? Leave the ice on for 20 minutes, 2-3 times a day. Medicines  Take medicines or put medicines on your skin only as told by your doctor.  If you were prescribed an antibiotic medicine, use it as told by your doctor. Do not stop using the antibiotic even if your condition improves. General instructions  Keep all follow-up visits as told by your doctor. This is important. How is this prevented? To help you have a lower risk of insect bites:  When you are outside, wear clothing that covers your arms and legs.  Use insect repellent. The best insect repellents have: ? An active ingredient of DEET, picaridin, oil of lemon eucalyptus (OLE), or IR3535. ? Higher amounts of DEET or another active ingredient than other repellents have.  If your home windows do not have screens, think about putting some in.  Contact a doctor if:  You have more redness, swelling, or pain in the bite area.  You have fluid, blood, or pus coming from the bite area.  The bite area feels warm.  You have a fever. Get help right away if:  You have joint pain.  You have a rash.  You have shortness of breath.  You feel more tired or sleepy than you normally do.  You have neck pain.  You have a headache.  You feel weaker than you normally do.  You have chest pain.  You have pain in your belly.  You feel sick to your stomach (nauseous) or you throw up (vomit). Summary  An insect bite can make your skin red, itchy, and swollen.  Do not scratch the bite area, and keep it clean and dry.  Ice can help with pain and itching from the bite. This information is not intended to replace advice given to you by your health care provider. Make sure you discuss any questions you have with your health care provider. Document Released: 04/22/2000 Document Revised: 11/26/2015 Document Reviewed: 09/10/2014 Elsevier Interactive Patient Education  2018 [employer]. Edema Edema is when you  have too much fluid in your body or under your skin. Edema may make your legs, feet, and ankles swell up. Swelling is also common in looser tissues, like around your eyes. This is a common condition. It gets more common as you get older. There are many possible causes of edema. Eating too much salt (sodium) and being on your feet or sitting for a long time can cause edema in your legs, feet, and ankles. Hot weather may make edema worse. Edema is usually painless. Your skin may look swollen or shiny. Follow these instructions at home:  Keep the swollen body part raised (elevated) above the level of your heart when you are  sitting or lying down.  Do not sit still or stand for a long time.  Do not wear tight clothes. Do not wear garters on your upper legs.  Exercise your legs. This can help the swelling go down.  Wear elastic bandages or support stockings as told by your doctor.  Eat a low-salt (low-sodium) diet to reduce fluid as told by your doctor.  Depending on the cause of your swelling, you may need to limit how much fluid you drink (fluid restriction).  Take over-the-counter and prescription medicines only as told by your doctor. Contact a doctor if:  Treatment is not working.  You have heart, liver, or kidney disease and have symptoms of edema.  You have sudden and unexplained weight gain. Get help right away if:  You have shortness of breath or chest pain.  You cannot breathe when you lie down.  You have pain, redness, or warmth in the swollen areas.  You have heart, liver, or kidney disease and get edema all of a sudden.  You have a fever and your symptoms get worse all of a sudden. Summary  Edema is when you have too much fluid in your body or under your skin.  Edema may make your legs, feet, and ankles swell up. Swelling is also common in looser tissues, like around your eyes.  Raise (elevate) the swollen body part above the level of your heart when you are sitting  or lying down.  Follow your doctor's instructions about diet and how much fluid you can drink (fluid restriction). This information is not intended to replace advice given to you by your health care provider. Make sure you discuss any questions you have with your health care provider. Document Released: 10/12/2007 Document Revised: 05/13/2016 Document Reviewed: 05/13/2016 Elsevier Interactive Patient Education  2017 [employer].

## 2017-06-01 NOTE — Progress Notes (Signed)
   THERAPIST PROGRESS NOTE  Session Time: 1:40pm-2:40pm  Participation Level: Active  Behavioral Response: DisheveledAlertIrritable  Type of Therapy:  Individual Therapy  Treatment Goals addressed: Improve psychiatric symptoms, reduce irrational fears (accurately interpret ordinary events and situation), improve unhelpful thought patterns, emotional regulation (stress tolerance, stress reduction), learn about diagnosis, healthy coping skills   Interventions: CBT, Motivational Interviewing, grounding and mindfulness techniques, psychoeducation  Summary: Diane [name] is a 67 y.o. female who presents with Bipolar I disorder, most recent episode depressed, severe, with psychotic features.   Suicidal/Homicidal: No - without intent/plan  Therapist Response: Vaughan Basta met with clinician for an individual session. Diane [name] discussed her psychiatric symptoms, her current life events and her homework. [name] reports she continues to go through a great deal of trouble getting settled into her new apartment. She reports that over the past 6 weeks, she has moved 5 times, she fell down a flight of 14 stairs two weeks ago, and now her apartment has roaches and bed bugs. Diane [name] reports increased depression and frustration due to all of these stressors. However, she also reports she has been in contact with her old boyfriend from college, who has been a support. She also identified making friends with the custodian in her building and a man who lives on the first floor.  Diane [name] reports once she gets the bedbugs under control, she will feel comfortable in her apartment. She reports she is glad to have her own space.   Plan: Return again in 1-2 weeks.  Diagnosis:     Axis I: Bipolar I disorder, most recent episode depressed, severe, with psychotic features.   Jobe Marker [place], LCSW 06/01/2017

## 2017-06-07 ENCOUNTER — Ambulatory Visit (INDEPENDENT_AMBULATORY_CARE_PROVIDER_SITE_OTHER): Payer: Medicare PPO | Admitting: Licensed Clinical Social Worker

## 2017-06-07 ENCOUNTER — Encounter (HOSPITAL_COMMUNITY): Payer: Self-pay | Admitting: Licensed Clinical Social Worker

## 2017-06-07 DIAGNOSIS — F315 Bipolar disorder, current episode depressed, severe, with psychotic features: Secondary | ICD-10-CM | POA: Diagnosis not present

## 2017-06-07 NOTE — Progress Notes (Signed)
   THERAPIST PROGRESS NOTE  Session Time: 2:30pm-3:30pm  Participation Level: Active  Behavioral Response: DisheveledAlertDepressed  Type of Therapy: Individual Therapy  Treatment Goals addressed: Improve psychiatric symptoms, reduce irrational fears (accurately interpret ordinary events and situation), improve unhelpful thought patterns, emotional regulation (stress tolerance, stress reduction), learn about diagnosis, healthy coping skills   Interventions: CBT, Motivational Interviewing, grounding and mindfulness techniques, psychoeducation  Summary: Diane [name] is a 67 y.o. female who presents with Bipolar I disorder, most recent episode depressed, severe, with psychotic features.   Suicidal/Homicidal: No - without intent/plan  Therapist Response: Diane [name] met with clinician for an individual session. Diane [name] discussed her psychiatric symptoms, her current life events and her homework. Diane [name] reports she still has bedbugs in her apartment and showed bites that cover her entire body. Diane [name] reports exterminators are coming today and will hopefully take care of the situation. However, she feels like her entire experience here in [place] has been "after the fact". For example, she does not find out important information about things until after the fact. Diane [name] processed what has been happening in her life, but she has not been able to make any significant changes in her mood due to her life being chaotic. However, she is finding some supports in her apartment building.   Plan: Return again in 1 weeks.  Diagnosis:     Axis I: Bipolar I disorder, most recent episode depressed, severe, with psychotic features.   Jobe Marker [place], LCSW 06/07/2017

## 2017-06-09 ENCOUNTER — Other Ambulatory Visit (HOSPITAL_COMMUNITY): Payer: Self-pay

## 2017-06-09 DIAGNOSIS — F315 Bipolar disorder, current episode depressed, severe, with psychotic features: Secondary | ICD-10-CM

## 2017-06-09 DIAGNOSIS — F331 Major depressive disorder, recurrent, moderate: Secondary | ICD-10-CM

## 2017-06-09 MED ORDER — CHLORPROMAZINE HCL 10 MG PO TABS: 10.0000 mg | ORAL_TABLET | Freq: Every day | ORAL | 1 refills | Status: DC

## 2017-06-09 MED ORDER — LAMOTRIGINE 100 MG PO TABS: 100.0000 mg | ORAL_TABLET | Freq: Every day | ORAL | 1 refills | Status: DC

## 2017-06-14 ENCOUNTER — Ambulatory Visit (INDEPENDENT_AMBULATORY_CARE_PROVIDER_SITE_OTHER): Payer: Medicare PPO | Admitting: Licensed Clinical Social Worker

## 2017-06-14 ENCOUNTER — Encounter (HOSPITAL_COMMUNITY): Payer: Self-pay | Admitting: Licensed Clinical Social Worker

## 2017-06-14 DIAGNOSIS — F315 Bipolar disorder, current episode depressed, severe, with psychotic features: Secondary | ICD-10-CM | POA: Diagnosis not present
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
## 2017-06-21 ENCOUNTER — Ambulatory Visit (HOSPITAL_COMMUNITY): Payer: Self-pay | Admitting: Licensed Clinical Social Worker

## 2017-06-21 ENCOUNTER — Other Ambulatory Visit (HOSPITAL_COMMUNITY): Payer: Self-pay | Admitting: Psychiatry

## 2017-06-21 ENCOUNTER — Telehealth (HOSPITAL_COMMUNITY): Payer: Self-pay

## 2017-06-21 NOTE — Telephone Encounter (Signed)
Patient is calling because her anxiety has increased, she is scratching herself and she is having increased depression. Her appointment has been moved up to 2/21. Please review and advise, thank you

## 2017-06-21 NOTE — Telephone Encounter (Signed)
If she cannot wait for her appointment then she can try BuSpar 5 mg twice a day.  She is already taking amitriptyline, Klonopin for her anxiety.

## 2017-06-22 NOTE — Telephone Encounter (Signed)
Nobody has Buspar right now, it is on backorder until at least March - I will call patient and see if she can wait until appointment

## 2017-06-27 ENCOUNTER — Ambulatory Visit (INDEPENDENT_AMBULATORY_CARE_PROVIDER_SITE_OTHER): Payer: Medicare PPO | Admitting: Orthopedic Surgery

## 2017-06-27 ENCOUNTER — Encounter (INDEPENDENT_AMBULATORY_CARE_PROVIDER_SITE_OTHER): Payer: Self-pay | Admitting: Orthopedic Surgery

## 2017-06-27 VITALS — Ht 61.0 in | Wt 219.6 lb

## 2017-06-27 DIAGNOSIS — R269 Unspecified abnormalities of gait and mobility: Secondary | ICD-10-CM

## 2017-06-27 DIAGNOSIS — E11610 Type 2 diabetes mellitus with diabetic neuropathic arthropathy: Secondary | ICD-10-CM | POA: Diagnosis not present

## 2017-06-27 MED ORDER — GABAPENTIN 300 MG PO CAPS: 300.0000 mg | ORAL_CAPSULE | Freq: Three times a day (TID) | ORAL | 3 refills | Status: DC

## 2017-06-27 NOTE — Progress Notes (Signed)
Office Visit Note   Patient: Diane [name]           Date of Birth: 04-26-1951           MRN: [id number] Visit Date: 06/27/2017              Requested by: No referring provider defined for this encounter. PCP: Pete Glatter, MD (Inactive)  Chief Complaint  Patient presents with  . Left Leg - Edema  . 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  She states she is just moved back from [place] she states she was homeless for a period of time she states she now has living.  She requests refill paperwork for her scat transportation she states she wants a motorized wheelchair new shoes and double upright braces and she wants to participate in [employer] sne[employer] program.  Patient has been on hydrochlorothiazide in the past she is currently on Lasix and request refill for gabapentin.  Assessment & Plan: Visit Diagnoses:  1. Charcot foot due to diabetes mellitus (HCC)   2. Gait abnormality     Plan: Patient was given a prescription for a biotech for extra-depth shoes custom orthotics refurbished her double upright braces.  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  We will call in a prescription for her gabapentin.  Discussed the importance of following up with her medical doctor for determination of Lasix versus hydrochlorothiazide for treatment.  Discussed that it is important that she follow-up with her medical doctor to determine the cause of her ataxic gait and multiple falls.  discussed that once her medical workup is complete for her ataxic gait and multiple falls if there is no resolution she may require a motorized wheelchair but at this point medical treatment and diagnosis of  the cause of her unsteady gait is warranted.  Feel that with increased strengthening she should ambulate better and have recommended she contact her insurance company to enroll in the Silver sne[employer] program.  Follow-Up Instructions: Return if symptoms worsen or fail to improve.   Ortho Exam  Patient is alert, oriented, no adenopathy, well-dressed, normal affect, normal respiratory effort. Examination patient has a palpable pulse bilaterally.  She has a Charcot rocker-bottom deformity of both feet with multiple toe amputations but there are no plantar ulcers at this time she has venous stasis changes in both legs and multiple healed lesions on her upper and lower extremity and no open wounds.  There is no redness no cellulitis in either foot.  Imaging: No results found. No images are attached to the encounter.  Labs: Lab Results  Component Value Date   HGBA1C 7.2 06/01/2017   HGBA1C 6.4 07/12/2016   HGBA1C 7.6 (H) 04/26/2016   ESRSEDRATE 51 (H) 05/31/2016   ESRSEDRATE 116 (H) 04/26/2016   REPTSTATUS 05/01/2016 FINAL 04/26/2016   CULT NO GROWTH 5 DAYS 04/26/2016    @LABSALLVALUES (HGBA1)@  Body mass index is 41.49 kg/m.  Orders:  No orders of the defined types were placed in this encounter.  Meds ordered this encounter  Medications  . gabapentin (NEURONTIN) 300 MG capsule    Sig: Take 1 capsule (300 mg total) by mouth 3 (three) times daily. 3 times a day when necessary neuropathy  pain    Dispense:  90 capsule    Refill:  3     Procedures: No procedures performed  Clinical Data: No additional findings.  ROS:  All other systems negative, except as noted in the HPI. Review of Systems  Objective: Vital Signs: Ht 5\' 1"  (1.549 m)   Wt 219 lb 9.6 oz (99.6 kg)   BMI 41.49 kg/m   Specialty Comments:  No specialty comments available.  PMFS History: Patient Active Problem List   Diagnosis Date Noted  . Pain in left leg 04/20/2017  . Charcot foot due to diabetes  mellitus (HCC) 02/21/2017  . Unilateral primary osteoarthritis, right knee 09/07/2016  . Pain in right hip 09/07/2016  . Vitamin D deficiency 07/25/2016  . Diabetic ulcer of right heel associated with type 2 diabetes mellitus, limited to breakdown of skin (HCC) 05/12/2016  . Osteomyelitis of ankle or foot   . 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 Right foot ulcer, limited to breakdown of skin (HCC) 04/26/2016  . Hypertension 04/26/2016  . DM (diabetes mellitus) type II controlled with renal manifestation (HCC) 04/26/2016  . Fibromyalgia 04/26/2016  . Diabetic polyneuropathy (HCC) 04/26/2016   Past Medical History:  Diagnosis Date  . Anxiety   . Arthritis    "everywhere" (04/26/2016)  . Childhood asthma   . Chronic lower back pain   . Claustrophobia   . Complication of anesthesia    "had metallic taste in my mouth for a year after one of my ORs; next OR they changed some stuff; no problems since" (04/26/2016)  . Depression   . Diabetic polyneuropathy (HCC)   . Family history of adverse reaction to anesthesia    "mom; don't have an idea what it was" (04/26/2016)  . Fibromyalgia   . GERD (gastroesophageal reflux disease)   . Headache    "related to stress" (04/26/2016)  . Hypertension   . Manic, depressive (HCC)    "severe" (04/26/2016)  . Pneumonia    "long time ago" (04/26/2016)  . Sickle cell trait (HCC)   . Sleep apnea    "mask ordered; too claustrophobic to wear" (04/26/2016)  . Type II diabetes mellitus (HCC)     History reviewed. No pertinent family history.  Past Surgical History:  Procedure Laterality Date  . ABDOMINAL HYSTERECTOMY     "partial"  . APPENDECTOMY    . CALCANEAL OSTEOTOMY Left 04/28/2016   Procedure: CALCANEAL OSTEOTOMY;  Surgeon: Nadara MustardMarcus V Revel Stellmach, MD;  [facility];  Service: Orthopedics;  Laterality: Left;  . CATARACT EXTRACTION W/ INTRAOCULAR LENS   IMPLANT, BILATERAL Bilateral   . FOOT SURGERY Left 2015  . FOOT SURGERY Right 04/2014   subtalar joint arthrodesis, talonavicualr joint arthrodesis, hammertoe repair 2,4,5 right foot  . FRACTURE SURGERY    . LAPAROSCOPIC CHOLECYSTECTOMY    . OPEN REDUCTION INTERNAL FIXATION (ORIF) TIBIA/FIBULA FRACTURE Left   . TOE AMPUTATION Bilateral    toe hallux   . TONSILLECTOMY AND ADENOIDECTOMY     Social History   Occupational History  . Not on file  Tobacco Use  . Smoking status: Never Smoker  . Smokeless tobacco: Never Used  Substance and Sexual Activity  . Alcohol use: No  . Drug use: No  . Sexual activity: No

## 2017-06-28 ENCOUNTER — Encounter (HOSPITAL_COMMUNITY): Payer: Self-pay | Admitting: Licensed Clinical Social Worker

## 2017-06-28 ENCOUNTER — Ambulatory Visit (INDEPENDENT_AMBULATORY_CARE_PROVIDER_SITE_OTHER): Payer: Medicare PPO | Admitting: Licensed Clinical Social Worker

## 2017-06-28 DIAGNOSIS — F315 Bipolar disorder, current episode depressed, severe, with psychotic features: Secondary | ICD-10-CM | POA: Diagnosis not present
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
## 2017-06-29 ENCOUNTER — Ambulatory Visit (INDEPENDENT_AMBULATORY_CARE_PROVIDER_SITE_OTHER): Payer: Medicare PPO | Admitting: Psychiatry

## 2017-06-29 ENCOUNTER — Encounter (HOSPITAL_COMMUNITY): Payer: Self-pay | Admitting: Psychiatry

## 2017-06-29 DIAGNOSIS — G894 Chronic pain syndrome: Secondary | ICD-10-CM

## 2017-06-29 DIAGNOSIS — R21 Rash and other nonspecific skin eruption: Secondary | ICD-10-CM

## 2017-06-29 DIAGNOSIS — Z62811 Personal history of psychological abuse in childhood: Secondary | ICD-10-CM | POA: Diagnosis not present

## 2017-06-29 DIAGNOSIS — F411 Generalized anxiety disorder: Secondary | ICD-10-CM

## 2017-06-29 DIAGNOSIS — F331 Major depressive disorder, recurrent, moderate: Secondary | ICD-10-CM

## 2017-06-29 DIAGNOSIS — R450 Nervousness: Secondary | ICD-10-CM

## 2017-06-29 DIAGNOSIS — M2550 Pain in unspecified joint: Secondary | ICD-10-CM

## 2017-06-29 DIAGNOSIS — F315 Bipolar disorder, current episode depressed, severe, with psychotic features: Secondary | ICD-10-CM

## 2017-06-29 DIAGNOSIS — F4310 Post-traumatic stress disorder, unspecified: Secondary | ICD-10-CM

## 2017-06-29 DIAGNOSIS — Z62810 Personal history of physical and sexual abuse in childhood: Secondary | ICD-10-CM | POA: Diagnosis not present

## 2017-06-29 DIAGNOSIS — G4700 Insomnia, unspecified: Secondary | ICD-10-CM

## 2017-06-29 DIAGNOSIS — M549 Dorsalgia, unspecified: Secondary | ICD-10-CM

## 2017-06-29 MED ORDER — LAMOTRIGINE 100 MG PO TABS: 100.0000 mg | ORAL_TABLET | Freq: Every day | ORAL | 1 refills | Status: DC

## 2017-06-29 MED ORDER — CHLORPROMAZINE HCL 10 MG PO TABS: 10.0000 mg | ORAL_TABLET | Freq: Every day | ORAL | 1 refills | Status: DC

## 2017-06-29 MED ORDER — CLONAZEPAM 0.5 MG PO TABS: 0.5000 mg | ORAL_TABLET | Freq: Every day | ORAL | 1 refills | Status: DC | PRN
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
## 2017-07-05 ENCOUNTER — Ambulatory Visit (INDEPENDENT_AMBULATORY_CARE_PROVIDER_SITE_OTHER): Payer: Medicare PPO | Admitting: Licensed Clinical Social Worker

## 2017-07-05 ENCOUNTER — Encounter (HOSPITAL_COMMUNITY): Payer: Self-pay | Admitting: Licensed Clinical Social Worker

## 2017-07-05 DIAGNOSIS — F315 Bipolar disorder, current episode depressed, severe, with psychotic features: Secondary | ICD-10-CM

## 2017-07-05 NOTE — Progress Notes (Signed)
   THERAPIST PROGRESS NOTE  Session Time: 2:30pm-3:30pm  Participation Level: Active  Behavioral Response: Bizarre and NeatAlertIrritable  Type of Therapy: Individual Therapy  Treatment Goals addressed: Improve psychiatric symptoms, reduce irrational fears (accurately interpret ordinary events and situation), improve unhelpful thought patterns, emotional regulation (stress tolerance, stress reduction), learn about diagnosis, healthy coping skills   Interventions: CBT, Motivational Interviewing, grounding and mindfulness techniques, psychoeducation  Summary: Diane [name] is a 67 y.o. female who presents with Bipolar I disorder, most recent episode depressed, severe, with psychotic features.   Suicidal/Homicidal: No - without intent/plan  Therapist Response: Diane [name] met with clinician for an individual session. Diane [name] discussed her psychiatric symptoms, her current life events and her homework. Diane [name] presented as much more put together than in previous sessions. She brought a needlepoint to show clinician and discussed ways that her crafts are helpful coping skills. Diane [name] provided updates on drama at her apartment building. She also reports increase of paranoia, three voices talking to her, and anxiety. Diane [name] reports she is still worried about bedbugs, as she recently saw some bedbugs on a man who sits at the front door. Clinician encouraged Diane [name] to report this to the city in order to get some real relief from these issues. Clinician also discussed the importance of being out and social in order to maintain her skills and to improve mood.   Plan: Return again in 1 weeks.  Diagnosis:     Axis I: Bipolar I disorder, most recent episode depressed, severe, with psychotic features.    Jobe Marker [place], LCSW 07/05/2017

## 2017-07-24 ENCOUNTER — Ambulatory Visit (HOSPITAL_BASED_OUTPATIENT_CLINIC_OR_DEPARTMENT_OTHER): Payer: Medicare PPO | Admitting: Internal Medicine

## 2017-07-24 ENCOUNTER — Inpatient Hospital Stay (HOSPITAL_COMMUNITY)
Admission: EM | Admit: 2017-07-24 | Discharge: 2017-07-28 | DRG: 607 | Disposition: A | Discharge status: Home Health | Payer: Medicare PPO | Attending: Family Medicine | Admitting: Family Medicine

## 2017-07-24 ENCOUNTER — Encounter (HOSPITAL_COMMUNITY): Payer: Self-pay | Admitting: Emergency Medicine

## 2017-07-24 ENCOUNTER — Encounter: Payer: Self-pay | Admitting: Internal Medicine

## 2017-07-24 ENCOUNTER — Emergency Department (HOSPITAL_COMMUNITY): Payer: Medicare PPO

## 2017-07-24 VITALS — BP 176/111 | HR 117 | Temp 100.4°F | Resp 16 | Wt 208.6 lb

## 2017-07-24 DIAGNOSIS — W57XXXA Bitten or stung by nonvenomous insect and other nonvenomous arthropods, initial encounter: Secondary | ICD-10-CM | POA: Diagnosis present

## 2017-07-24 DIAGNOSIS — E11610 Type 2 diabetes mellitus with diabetic neuropathic arthropathy: Secondary | ICD-10-CM

## 2017-07-24 DIAGNOSIS — Z886 Allergy status to analgesic agent status: Secondary | ICD-10-CM

## 2017-07-24 DIAGNOSIS — L97509 Non-pressure chronic ulcer of other part of unspecified foot with unspecified severity: Secondary | ICD-10-CM | POA: Diagnosis not present

## 2017-07-24 DIAGNOSIS — D573 Sickle-cell trait: Secondary | ICD-10-CM | POA: Diagnosis present

## 2017-07-24 DIAGNOSIS — Z794 Long term (current) use of insulin: Secondary | ICD-10-CM

## 2017-07-24 DIAGNOSIS — F40240 Claustrophobia: Secondary | ICD-10-CM | POA: Diagnosis present

## 2017-07-24 DIAGNOSIS — M869 Osteomyelitis, unspecified: Secondary | ICD-10-CM

## 2017-07-24 DIAGNOSIS — M1711 Unilateral primary osteoarthritis, right knee: Secondary | ICD-10-CM | POA: Diagnosis present

## 2017-07-24 DIAGNOSIS — R000 Tachycardia, unspecified: Secondary | ICD-10-CM

## 2017-07-24 DIAGNOSIS — E1122 Type 2 diabetes mellitus with diabetic chronic kidney disease: Secondary | ICD-10-CM | POA: Diagnosis present

## 2017-07-24 DIAGNOSIS — E1169 Type 2 diabetes mellitus with other specified complication: Secondary | ICD-10-CM | POA: Insufficient documentation

## 2017-07-24 DIAGNOSIS — S40869A Insect bite (nonvenomous) of unspecified upper arm, initial encounter: Secondary | ICD-10-CM | POA: Diagnosis present

## 2017-07-24 DIAGNOSIS — R079 Chest pain, unspecified: Secondary | ICD-10-CM | POA: Diagnosis not present

## 2017-07-24 DIAGNOSIS — R0602 Shortness of breath: Secondary | ICD-10-CM | POA: Insufficient documentation

## 2017-07-24 DIAGNOSIS — Z91040 Latex allergy status: Secondary | ICD-10-CM

## 2017-07-24 DIAGNOSIS — Z79899 Other long term (current) drug therapy: Secondary | ICD-10-CM | POA: Diagnosis not present

## 2017-07-24 DIAGNOSIS — K219 Gastro-esophageal reflux disease without esophagitis: Secondary | ICD-10-CM | POA: Diagnosis present

## 2017-07-24 DIAGNOSIS — M797 Fibromyalgia: Secondary | ICD-10-CM | POA: Insufficient documentation

## 2017-07-24 DIAGNOSIS — J40 Bronchitis, not specified as acute or chronic: Secondary | ICD-10-CM

## 2017-07-24 DIAGNOSIS — Y92099 Unspecified place in other non-institutional residence as the place of occurrence of the external cause: Secondary | ICD-10-CM | POA: Diagnosis not present

## 2017-07-24 DIAGNOSIS — Z9841 Cataract extraction status, right eye: Secondary | ICD-10-CM

## 2017-07-24 DIAGNOSIS — Z880 Allergy status to penicillin: Secondary | ICD-10-CM

## 2017-07-24 DIAGNOSIS — L03115 Cellulitis of right lower limb: Secondary | ICD-10-CM | POA: Diagnosis not present

## 2017-07-24 DIAGNOSIS — Z90711 Acquired absence of uterus with remaining cervical stump: Secondary | ICD-10-CM

## 2017-07-24 DIAGNOSIS — L03031 Cellulitis of right toe: Secondary | ICD-10-CM | POA: Diagnosis not present

## 2017-07-24 DIAGNOSIS — E11622 Type 2 diabetes mellitus with other skin ulcer: Secondary | ICD-10-CM | POA: Insufficient documentation

## 2017-07-24 DIAGNOSIS — I129 Hypertensive chronic kidney disease with stage 1 through stage 4 chronic kidney disease, or unspecified chronic kidney disease: Secondary | ICD-10-CM | POA: Diagnosis present

## 2017-07-24 DIAGNOSIS — E1022 Type 1 diabetes mellitus with diabetic chronic kidney disease: Secondary | ICD-10-CM | POA: Diagnosis not present

## 2017-07-24 DIAGNOSIS — L97519 Non-pressure chronic ulcer of other part of right foot with unspecified severity: Secondary | ICD-10-CM

## 2017-07-24 DIAGNOSIS — E1142 Type 2 diabetes mellitus with diabetic polyneuropathy: Secondary | ICD-10-CM | POA: Diagnosis present

## 2017-07-24 DIAGNOSIS — Z881 Allergy status to other antibiotic agents status: Secondary | ICD-10-CM

## 2017-07-24 DIAGNOSIS — L309 Dermatitis, unspecified: Secondary | ICD-10-CM

## 2017-07-24 DIAGNOSIS — I10 Essential (primary) hypertension: Secondary | ICD-10-CM

## 2017-07-24 DIAGNOSIS — Z9049 Acquired absence of other specified parts of digestive tract: Secondary | ICD-10-CM

## 2017-07-24 DIAGNOSIS — D631 Anemia in chronic kidney disease: Secondary | ICD-10-CM | POA: Diagnosis present

## 2017-07-24 DIAGNOSIS — Z91048 Other nonmedicinal substance allergy status: Secondary | ICD-10-CM

## 2017-07-24 DIAGNOSIS — G8929 Other chronic pain: Secondary | ICD-10-CM | POA: Diagnosis present

## 2017-07-24 DIAGNOSIS — Z89429 Acquired absence of other toe(s), unspecified side: Secondary | ICD-10-CM

## 2017-07-24 DIAGNOSIS — N183 Chronic kidney disease, stage 3 (moderate): Secondary | ICD-10-CM | POA: Diagnosis present

## 2017-07-24 DIAGNOSIS — F419 Anxiety disorder, unspecified: Secondary | ICD-10-CM

## 2017-07-24 DIAGNOSIS — E785 Hyperlipidemia, unspecified: Secondary | ICD-10-CM | POA: Diagnosis present

## 2017-07-24 DIAGNOSIS — M545 Low back pain: Secondary | ICD-10-CM | POA: Diagnosis present

## 2017-07-24 DIAGNOSIS — Z89412 Acquired absence of left great toe: Secondary | ICD-10-CM

## 2017-07-24 DIAGNOSIS — G4733 Obstructive sleep apnea (adult) (pediatric): Secondary | ICD-10-CM | POA: Diagnosis present

## 2017-07-24 DIAGNOSIS — E11621 Type 2 diabetes mellitus with foot ulcer: Secondary | ICD-10-CM

## 2017-07-24 DIAGNOSIS — E559 Vitamin D deficiency, unspecified: Secondary | ICD-10-CM | POA: Diagnosis present

## 2017-07-24 DIAGNOSIS — E11628 Type 2 diabetes mellitus with other skin complications: Secondary | ICD-10-CM

## 2017-07-24 DIAGNOSIS — E118 Type 2 diabetes mellitus with unspecified complications: Secondary | ICD-10-CM | POA: Diagnosis not present

## 2017-07-24 DIAGNOSIS — L03119 Cellulitis of unspecified part of limb: Secondary | ICD-10-CM

## 2017-07-24 DIAGNOSIS — M7989 Other specified soft tissue disorders: Secondary | ICD-10-CM | POA: Insufficient documentation

## 2017-07-24 DIAGNOSIS — F329 Major depressive disorder, single episode, unspecified: Secondary | ICD-10-CM | POA: Diagnosis present

## 2017-07-24 DIAGNOSIS — Z9842 Cataract extraction status, left eye: Secondary | ICD-10-CM

## 2017-07-24 DIAGNOSIS — L0390 Cellulitis, unspecified: Secondary | ICD-10-CM | POA: Diagnosis present

## 2017-07-24 DIAGNOSIS — Z961 Presence of intraocular lens: Secondary | ICD-10-CM | POA: Diagnosis present

## 2017-07-24 DIAGNOSIS — S30861A Insect bite (nonvenomous) of abdominal wall, initial encounter: Secondary | ICD-10-CM | POA: Diagnosis present

## 2017-07-24 DIAGNOSIS — S80861A Insect bite (nonvenomous), right lower leg, initial encounter: Secondary | ICD-10-CM | POA: Diagnosis not present

## 2017-07-24 LAB — CBC WITH DIFFERENTIAL/PLATELET
BASOS PCT: 0 %
Basophils Absolute: 0.1 10*3/uL (ref 0.0–0.1)
Eosinophils Absolute: 0.5 10*3/uL (ref 0.0–0.7)
Eosinophils Relative: 3 %
HEMATOCRIT: 31.5 % — AB (ref 36.0–46.0)
HEMOGLOBIN: 10 g/dL — AB (ref 12.0–15.0)
LYMPHS PCT: 16 %
Lymphs Abs: 2.4 10*3/uL (ref 0.7–4.0)
MCH: 25.6 pg — ABNORMAL LOW (ref 26.0–34.0)
MCHC: 31.7 g/dL (ref 30.0–36.0)
MCV: 80.6 fL (ref 78.0–100.0)
MONO ABS: 0.9 10*3/uL (ref 0.1–1.0)
MONOS PCT: 6 %
NEUTROS ABS: 10.9 10*3/uL — AB (ref 1.7–7.7)
NEUTROS PCT: 75 %
Platelets: 387 10*3/uL (ref 150–400)
RBC: 3.91 MIL/uL (ref 3.87–5.11)
RDW: 15.4 % (ref 11.5–15.5)
WBC: 14.6 10*3/uL — ABNORMAL HIGH (ref 4.0–10.5)

## 2017-07-24 LAB — COMPREHENSIVE METABOLIC PANEL
ALBUMIN: 3.2 g/dL — AB (ref 3.5–5.0)
ALK PHOS: 79 U/L (ref 38–126)
ALT: 10 U/L — ABNORMAL LOW (ref 14–54)
ANION GAP: 10 (ref 5–15)
AST: 16 U/L (ref 15–41)
BILIRUBIN TOTAL: 0.3 mg/dL (ref 0.3–1.2)
BUN: 25 mg/dL — ABNORMAL HIGH (ref 6–20)
CALCIUM: 8.7 mg/dL — AB (ref 8.9–10.3)
CO2: 22 mmol/L (ref 22–32)
Chloride: 106 mmol/L (ref 101–111)
Creatinine, Ser: 1.63 mg/dL — ABNORMAL HIGH (ref 0.44–1.00)
GFR calc Af Amer: 37 mL/min — ABNORMAL LOW (ref >60)
GFR, EST NON AFRICAN AMERICAN: 32 mL/min — AB (ref >60)
GLUCOSE: 134 mg/dL — AB (ref 65–99)
Potassium: 4.3 mmol/L (ref 3.5–5.1)
Sodium: 138 mmol/L (ref 135–145)
TOTAL PROTEIN: 7.9 g/dL (ref 6.5–8.1)

## 2017-07-24 LAB — GLUCOSE, POCT (MANUAL RESULT ENTRY): POC Glucose: 112 mg/dl — AB (ref 70–99)

## 2017-07-24 MED ORDER — SODIUM CHLORIDE 0.9 % IV BOLUS (SEPSIS)
1000.0000 mL | Freq: Once | INTRAVENOUS | Status: AC
  Administered 2017-07-24: 1000 mL via INTRAVENOUS

## 2017-07-24 MED ORDER — METRONIDAZOLE IN NACL 5-0.79 MG/ML-% IV SOLN: 500.0000 mg | INTRAVENOUS | Status: DC

## 2017-07-24 MED ORDER — VANCOMYCIN HCL 10 G IV SOLR
2000.0000 mg | INTRAVENOUS | Status: AC
  Administered 2017-07-24: 2000 mg via INTRAVENOUS
  Filled 2017-07-24: qty 2000

## 2017-07-24 MED ORDER — SODIUM CHLORIDE 0.9 % IV SOLN
2.0000 g | INTRAVENOUS | Status: AC
  Administered 2017-07-25: 2 g via INTRAVENOUS
  Filled 2017-07-24: qty 2

## 2017-07-24 NOTE — ED Provider Notes (Signed)
[facility] DEPT Provider Note   CSN: [id number] Arrival date & time: 07/24/17  1736     History   Chief Complaint Chief Complaint  Patient presents with  . Foot Pain    HPI Diane [name] is a 67 y.o. female.  HPI   Is a 67 year old female with fibromyalgia, hypertension, hyperlipidemia, diabetic diabetes, history of osteo-and irritation of the of the right great toe.  She is presenting today with a "bug bite".  Patient reports redness to her right lower extremity.  She went to her primary care physician office who sent her here because of infection.  Patient states she is unsure whether it is actually infected or just "a bug bite".  Patient denies any fever.  She reports her last osteo-infection was in 2013.   Past Medical History:  Diagnosis Date  . Anxiety   . Arthritis    "everywhere" (04/26/2016)  . Childhood asthma   . Chronic lower back pain   . Claustrophobia   . Complication of anesthesia    "had metallic taste in my mouth for a year after one of my ORs; next OR they changed some stuff; no problems since" (04/26/2016)  . Depression   . Diabetic polyneuropathy ([place])   . Family history of adverse reaction to anesthesia    "mom; don't have an idea what it was" (04/26/2016)  . Fibromyalgia   . GERD (gastroesophageal reflux disease)   . Headache    "related to stress" (04/26/2016)  . Hypertension   . Manic, depressive ([place])    "severe" (04/26/2016)  . Pneumonia    "long time ago" (04/26/2016)  . Sickle cell trait ([place])   . Sleep apnea    "mask ordered; too claustrophobic to wear" (04/26/2016)  . Type II diabetes mellitus [facility])     Patient Active Problem List   Diagnosis Date Noted  . Pain in left leg 04/20/2017  . Charcot foot due to diabetes mellitus ([place]) 02/21/2017  . Unilateral primary osteoarthritis, right knee 09/07/2016  . Pain in right hip 09/07/2016  . Vitamin D deficiency 07/25/2016  . Diabetic ulcer of  right heel associated with type 2 diabetes mellitus, limited to breakdown of skin ([place]) 05/12/2016  . Osteomyelitis of ankle or foot   . Diabetic ulcer of left heel associated with type 2 diabetes mellitus, with bone involvement without evidence of necrosis ([place])   . Right foot ulcer, limited to breakdown of skin ([place]) 04/26/2016  . Hypertension 04/26/2016  . DM (diabetes mellitus) type II controlled with renal manifestation ([place]) 04/26/2016  . Fibromyalgia 04/26/2016  . Diabetic polyneuropathy ([place]) 04/26/2016    Past Surgical History:  Procedure Laterality Date  . ABDOMINAL HYSTERECTOMY     "partial"  . APPENDECTOMY    . CALCANEAL OSTEOTOMY Left 04/28/2016   Procedure: CALCANEAL OSTEOTOMY;  Surgeon: Newt Minion, MD;  Location: [facility];  Service: Orthopedics;  Laterality: Left;  . CATARACT EXTRACTION W/ INTRAOCULAR LENS  IMPLANT, BILATERAL Bilateral   . FOOT SURGERY Left 2015  . FOOT SURGERY Right 04/2014   subtalar joint arthrodesis, talonavicualr joint arthrodesis, hammertoe repair 2,4,5 right foot  . FRACTURE SURGERY    . LAPAROSCOPIC CHOLECYSTECTOMY    . OPEN REDUCTION INTERNAL FIXATION (ORIF) TIBIA/FIBULA FRACTURE Left   . TOE AMPUTATION Bilateral    toe hallux   . TONSILLECTOMY AND ADENOIDECTOMY      OB History    No data available       Home Medications  Prior to Admission medications   Medication Sig Start Date End Date Taking? Authorizing Provider  Blood Glucose Monitoring Suppl (ACCU-CHEK AVIVA PLUS) w/Device KIT Use as directed for 3 times daily testing of blood glucose. E11.9 09/07/16   Maren Reamer, MD  cetirizine (ZYRTEC) 10 MG tablet Take 1 tablet (10 mg total) by mouth daily. 06/01/17   Argentina Donovan, PA-C  chlorproMAZINE (THORAZINE) 10 MG tablet Take 1 tablet (10 mg total) by mouth at bedtime. 06/29/17   Arfeen, Arlyce Harman, MD  clonazePAM (KLONOPIN) 0.5 MG tablet Take 1 tablet (0.5 mg total) by mouth daily as needed for anxiety. 06/29/17 06/29/18  Arfeen,  Arlyce Harman, MD  diphenhydrAMINE (BENADRYL) 25 MG tablet Take 25 mg by mouth every 6 (six) hours as needed.    [provider]  donepezil (ARICEPT) 5 MG tablet Take 1 tablet (5 mg total) by mouth at bedtime. 05/13/16   Argentina Donovan, PA-C  ergocalciferol (VITAMIN D2) 50000 units capsule Take 1 capsule (50,000 Units total) by mouth once a week. 05/13/16   Argentina Donovan, PA-C  famotidine (PEPCID) 20 MG tablet Take 1 tablet (20 mg total) by mouth 2 (two) times daily. 09/06/16   Maren Reamer, MD  furosemide (LASIX) 20 MG tablet Take 1 tablet (20 mg total) by mouth daily. 06/01/17   Argentina Donovan, PA-C  gabapentin (NEURONTIN) 300 MG capsule Take 2 capsules (600 mg total) by mouth 3 (three) times daily. 05/28/16   Newt Minion, MD  gabapentin (NEURONTIN) 300 MG capsule Take 1 capsule (300 mg total) by mouth 3 (three) times daily. 3 times a day when necessary neuropathy pain 06/27/17   Newt Minion, MD  glucose blood (ACCU-CHEK AVIVA PLUS) test strip Use as instructed for 3 times daily testing of blood glucose. E.11.9 09/26/16   Langeland, Arrie Aran T, MD  insulin aspart (NOVOLOG) 100 UNIT/ML FlexPen Inject 1 Units into the skin 3 (three) times daily with meals. Sliding scale; 12 units max per day 09/21/16   [name] Mussel T, MD  Insulin Pen Needle (ULTICARE MICRO PEN NEEDLES) 32G X 4 MM MISC 1 applicator by Does not apply route at bedtime. 09/21/16   Maren Reamer, MD  lamoTRIgine (LAMICTAL) 100 MG tablet Take 1 tablet (100 mg total) by mouth daily. 06/29/17   Arfeen, Arlyce Harman, MD  losartan (COZAAR) 50 MG tablet Take 1 tablet (50 mg total) by mouth daily. 09/06/16   Maren Reamer, MD  mupirocin ointment Drue Stager) 2 % Apply to any area that appears infected 2X daily 06/01/17   Argentina Donovan, PA-C  oxybutynin (DITROPAN-XL) 10 MG 24 hr tablet Take 1 tablet (10 mg total) by mouth at bedtime. 09/06/16   Maren Reamer, MD  pravastatin (PRAVACHOL) 20 MG tablet Take 1 tablet (20 mg total) by  mouth daily. 09/06/16   Maren Reamer, MD  triamcinolone cream (KENALOG) 0.1 % Apply 1 application topically 2 (two) times daily. 06/01/17   Argentina Donovan, PA-C    Family History No family history on file.  Social History Social History   Tobacco Use  . Smoking status: Never Smoker  . Smokeless tobacco: Never Used  Substance Use Topics  . Alcohol use: No  . Drug use: No     Allergies   Erythromycin; Biaxin [clarithromycin]; Feldene [piroxicam]; Nsaids; Tetracyclines & related; Latex; Penicillins; and Tape   Review of Systems Review of Systems  Constitutional: Negative for activity change.  Respiratory: Negative for shortness  of breath.   Cardiovascular: Negative for chest pain.  Gastrointestinal: Negative for abdominal pain.     Physical Exam Updated Vital Signs Pulse (!) 115   Temp 99.4 F (37.4 C) (Oral)   Resp (!) 22   Ht 5' 1"  (1.549 m)   Wt 94.3 kg (208 lb)   SpO2 93%   BMI 39.30 kg/m   Physical Exam  Constitutional: She is oriented to person, place, and time. She appears well-developed and well-nourished.  HENT:  Head: Normocephalic and atraumatic.  Eyes: Right eye exhibits no discharge. Left eye exhibits no discharge.  Cardiovascular: Normal rate and regular rhythm.  Pulmonary/Chest: Effort normal and breath sounds normal. No respiratory distress.  Musculoskeletal:  Right foot already missing great toe.  Now erythematous swelling warm evidence of infection..  Neurological: She is oriented to person, place, and time.  Skin: Skin is warm and dry. She is not diaphoretic.  She has scratch marks all over her body which are "from bedbugs".  Psychiatric: She has a normal mood and affect.  Nursing note and vitals reviewed.        ED Treatments / Results  Labs (all labs ordered are listed, but only abnormal results are displayed) Labs Reviewed  CBC WITH DIFFERENTIAL/PLATELET  COMPREHENSIVE METABOLIC PANEL  I-STAT CG4 LACTIC ACID, ED     EKG  EKG Interpretation None       Radiology No results found.  Procedures Procedures (including critical care time)    Medications Ordered in ED Medications - No data to display   Initial Impression / Assessment and Plan / ED Course  I have reviewed the triage vital signs and the nursing notes.  Pertinent labs & imaging results that were available during my care of the patient were reviewed by me and considered in my medical decision making (see chart for details).     Is a 67 year old female with fibromyalgia, hypertension, hyperlipidemia, diabetic diabetes, history of osteo-and irritation of the of the right great toe.  She is presenting today with a "bug bite".  Patient reports redness to her right lower extremity.  She went to her primary care physician office who sent her here because of infection.  Patient states she is unsure whether it is actually infected or just "a bug bite".  Patient denies any fever.  She reports her last osteo-infection was in 2013.   9:54 PM Patient sent from PCP for admission.  Patient will require inpatient admission given her predisposition to osteo-, and current severe infection in her right foot.  Will start IV antibiotics.  X-ray ordered   Final Clinical Impressions(s) / ED Diagnoses   Final diagnoses:  None    ED Discharge Orders    None       Macarthur Critchley, MD 07/30/17 [phone]

## 2017-07-24 NOTE — Patient Instructions (Signed)
I recommend that you be seen in the ER today for further evaluation.  You have an infection in your right foot.  The chest pain and shortness of breath will also be evaluated.

## 2017-07-24 NOTE — Progress Notes (Signed)
Patient ID: Diane [name], female    DOB: 09/10/1950  MRN: [id number]  CC: re-establish; Diabetes; and Hypertension   Subjective: Diane [name] is a 67 y.o. female who presents to establish with me as PCP with multiple acute concerns  Her concerns today include:  DM, HTN, OSA (not using CPAP too claustophobia), fibromyalgia, HL, anxiety/dep (Dr. Adele Schilder)  1.  C/o having itchy rash on extremities since 05/2017 after moving into a senior complex building -had to sleep on floor on pillows for a few wks until she got a bed. -saw bedbugs on the wall.   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  She still sees the bugs.  Given Benadryl and triamcinolone cream on last visit with PA.  She has not found these helpful  2.  Complains of swelling and pain and of the right foot that has gotten progressively worse since last week.  Associated with fever.  Denies any trauma to the foot.  Positive swelling in both lower legs. patient with history of amputation of both big toes.  Patient is diabetic  3.  C/o having cough productive of clear to green phlegm x 1 wk. some audible wheezing Prior to this she had been having shortness of breath with substernal chest pains with minimal exertion for the past 2-3 weeks.  +orthopnea and LE edema -hurting in sides from the coughing -endorse subjective fever this past wkend  4.  Patient noted to be hypertensive and tachycardic today. She reports not having Cozaar and furosemide but they are in her med bag Patient Active Problem List   Diagnosis Date Noted  . Pain in left leg 04/20/2017  . Charcot foot due to diabetes mellitus ([place]) 02/21/2017  . Unilateral primary osteoarthritis, right knee 09/07/2016  . Pain in right hip 09/07/2016  . Vitamin D deficiency 07/25/2016  . Diabetic ulcer of right heel associated with type 2 diabetes mellitus, limited to breakdown of skin ([place]) 05/12/2016  . Osteomyelitis of ankle or foot   . Diabetic ulcer of left  heel associated with type 2 diabetes mellitus, with bone involvement without evidence of necrosis ([place])   . Right foot ulcer, limited to breakdown of skin ([place]) 04/26/2016  . Hypertension 04/26/2016  . DM (diabetes mellitus) type II controlled with renal manifestation ([place]) 04/26/2016  . Fibromyalgia 04/26/2016  . Diabetic polyneuropathy ([place]) 04/26/2016     Current Outpatient Medications on File Prior to Visit  Medication Sig Dispense Refill  . Blood Glucose Monitoring Suppl (ACCU-CHEK AVIVA PLUS) w/Device KIT Use as directed for 3 times daily testing of blood glucose. E11.9 1 kit 0  . cetirizine (ZYRTEC) 10 MG tablet Take 1 tablet (10 mg total) by mouth daily. 30 tablet 11  . chlorproMAZINE (THORAZINE) 10 MG tablet Take 1 tablet (10 mg total) by mouth at bedtime. 30 tablet 1  . clonazePAM (KLONOPIN) 0.5 MG tablet Take 1 tablet (0.5 mg total) by mouth daily as needed for anxiety. 30 tablet 1  . diphenhydrAMINE (BENADRYL) 25 MG tablet Take 25 mg by mouth every 6 (six) hours as needed.    . donepezil (ARICEPT) 5 MG tablet Take 1 tablet (5 mg total) by mouth at bedtime. 30 tablet 5  . ergocalciferol (VITAMIN D2) 50000 units capsule Take 1 capsule (50,000 Units total) by mouth once a week. 12 capsule 0  . famotidine (PEPCID) 20 MG tablet Take 1 tablet (20 mg total) by mouth 2 (two) times daily. 60 tablet 3  . furosemide (LASIX)  20 MG tablet Take 1 tablet (20 mg total) by mouth daily. 30 tablet 3  . gabapentin (NEURONTIN) 300 MG capsule Take 2 capsules (600 mg total) by mouth 3 (three) times daily. 180 capsule 3  . gabapentin (NEURONTIN) 300 MG capsule Take 1 capsule (300 mg total) by mouth 3 (three) times daily. 3 times a day when necessary neuropathy pain 90 capsule 3  . glucose blood (ACCU-CHEK AVIVA PLUS) test strip Use as instructed for 3 times daily testing of blood glucose. E.11.9 100 each 5  . insulin aspart (NOVOLOG) 100 UNIT/ML FlexPen Inject 1 Units into the skin 3 (three) times daily  with meals. Sliding scale; 12 units max per day 3 mL 2  . Insulin Pen Needle (ULTICARE MICRO PEN NEEDLES) 32G X 4 MM MISC 1 applicator by Does not apply route at bedtime. 100 each 2  . lamoTRIgine (LAMICTAL) 100 MG tablet Take 1 tablet (100 mg total) by mouth daily. 30 tablet 1  . losartan (COZAAR) 50 MG tablet Take 1 tablet (50 mg total) by mouth daily. 90 tablet 3  . mupirocin ointment (BACTROBAN) 2 % Apply to any area that appears infected 2X daily 30 g 1  . oxybutynin (DITROPAN-XL) 10 MG 24 hr tablet Take 1 tablet (10 mg total) by mouth at bedtime. 90 tablet 3  . pravastatin (PRAVACHOL) 20 MG tablet Take 1 tablet (20 mg total) by mouth daily. 90 tablet 3  . triamcinolone cream (KENALOG) 0.1 % Apply 1 application topically 2 (two) times daily. 30 g 0   No current facility-administered medications on file prior to visit.     Allergies  Allergen Reactions  . Erythromycin Anaphylaxis  . Biaxin [Clarithromycin] Other (See Comments)  . Feldene [Piroxicam] Diarrhea, Nausea And Vomiting and Other (See Comments)    And sores in mouth  . Nsaids Other (See Comments)    Contraindicated because of her other meds  . Tetracyclines & Related   . Latex Rash  . Penicillins Rash and Other (See Comments)    Has patient had a PCN reaction causing immediate rash, facial/tongue/throat swelling, SOB or lightheadedness with hypotension: Yes Has patient had a PCN reaction causing severe rash involving mucus membranes or skin necrosis: No Has patient had a PCN reaction that required hospitalization No Has patient had a PCN reaction occurring within the last 10 years: No If all of the above answers are "NO", then may proceed with Cephalosporin use. And sores in her mouth   . Tape Rash    Social History   Socioeconomic History  . Marital status: Single    Spouse name: Not on file  . Number of children: Not on file  . Years of education: Not on file  . Highest education level: Not on file  Social  Needs  . Financial resource strain: Not on file  . Food insecurity - worry: Not on file  . Food insecurity - inability: Not on file  . Transportation needs - medical: Not on file  . Transportation needs - non-medical: Not on file  Occupational History  . Not on file  Tobacco Use  . Smoking status: Never Smoker  . Smokeless tobacco: Never Used  Substance and Sexual Activity  . Alcohol use: No  . Drug use: No  . Sexual activity: No  Other Topics Concern  . Not on file  Social History Narrative  . Not on file    No family history on file.  Past Surgical History:  Procedure Laterality Date  .  ABDOMINAL HYSTERECTOMY     "partial"  . APPENDECTOMY    . CALCANEAL OSTEOTOMY Left 04/28/2016   Procedure: CALCANEAL OSTEOTOMY;  Surgeon: Newt Minion, MD;  Location: [facility];  Service: Orthopedics;  Laterality: Left;  . CATARACT EXTRACTION W/ INTRAOCULAR LENS  IMPLANT, BILATERAL Bilateral   . FOOT SURGERY Left 2015  . FOOT SURGERY Right 04/2014   subtalar joint arthrodesis, talonavicualr joint arthrodesis, hammertoe repair 2,4,5 right foot  . FRACTURE SURGERY    . LAPAROSCOPIC CHOLECYSTECTOMY    . OPEN REDUCTION INTERNAL FIXATION (ORIF) TIBIA/FIBULA FRACTURE Left   . TOE AMPUTATION Bilateral    toe hallux   . TONSILLECTOMY AND ADENOIDECTOMY      ROS: Review of Systems Negative except as stated above PHYSICAL EXAM: BP (!) 176/111   Pulse (!) 117   Temp (!) 100.4 F (38 C) (Diane)   Resp 16   Wt 208 lb 9.6 oz (94.6 kg)   BMI 39.41 kg/m   Pox 93-94% room air at rest  Physical Exam  General appearance -patient very talkative.  She is noted to be short of breath with audible wheezes Mental status -oriented to person place and time Neck - supple, no significant adenopathy Chest -clear bilaterally Heart -tachycardic but regular rate rhythm  extremities -trace to 1+ lower extremity edema right greater than left Skin -right foot mild to moderate erythema of most of the mid to  forefoot including second through the fourth toes.  Tender to touch.  No fluctuance appreciated.  Does have some bruise on the sole of the forefoot. Skin on forearms with numerous hyperpigmented spots and excoriations BS 112 Lab Results  Component Value Date   HGBA1C 7.2 06/01/2017     ASSESSMENT AND PLAN: 1. Cellulitis of right foot 2. Chest pain in adult 3. Bronchitis 4. Dermatitis Given that she has diabetes and history of amputations, currently febrile with tachycardia, SOB and CP, I recommend that she be seen in the emergency room for possible admission -I will refer to dermatology for the rash. 5. Controlled type 2 diabetes mellitus with complication, with long-term current use of insulin (HCC) - POCT glucose (manual entry)  Patient was given the opportunity to ask questions.  Patient verbalized understanding of the plan and was able to repeat key elements of the plan.   No orders of the defined types were placed in this encounter.    Requested Prescriptions    No prescriptions requested or ordered in this encounter    No Follow-up on file.  Karle Plumber, MD, FACP

## 2017-07-24 NOTE — H&P (Signed)
[facility]                                                                                    Patient Demographics  Diane [name], is a 67 y.o. female  CSN: [id number]  MRN: [id number]  DOB - [birth year]  Admit Date - 07/24/2017  Outpatient Primary MD for the patient is Ladell Pier, MD   With History of -  Past Medical History:  Diagnosis Date  . Anxiety   . Arthritis    "everywhere" (04/26/2016)  . Childhood asthma   . Chronic lower back pain   . Claustrophobia   . Complication of anesthesia    "had metallic taste in my mouth for a year after one of my ORs; next OR they changed some stuff; no problems since" (04/26/2016)  . Depression   . Diabetic polyneuropathy ([place])   . Family history of adverse reaction to anesthesia    "mom; don't have an idea what it was" (04/26/2016)  . Fibromyalgia   . GERD (gastroesophageal reflux disease)   . Headache    "related to stress" (04/26/2016)  . Hypertension   . Manic, depressive ([place])    "severe" (04/26/2016)  . Pneumonia    "long time ago" (04/26/2016)  . Sickle cell trait ([place])   . Sleep apnea    "mask ordered; too claustrophobic to wear" (04/26/2016)  . Type II diabetes mellitus ([place])       Past Surgical History:  Procedure Laterality Date  . ABDOMINAL HYSTERECTOMY     "partial"  . APPENDECTOMY    . CALCANEAL OSTEOTOMY Left 04/28/2016   Procedure: CALCANEAL OSTEOTOMY;  Surgeon: Newt Minion, MD;  Location: [facility];  Service: Orthopedics;  Laterality: Left;  . CATARACT EXTRACTION W/ INTRAOCULAR LENS  IMPLANT, BILATERAL Bilateral   . FOOT SURGERY Left 2015  . FOOT SURGERY Right 04/2014   subtalar joint arthrodesis, talonavicualr joint arthrodesis, hammertoe repair 2,4,5 right foot  . FRACTURE SURGERY    . LAPAROSCOPIC CHOLECYSTECTOMY    . OPEN REDUCTION INTERNAL FIXATION (ORIF) TIBIA/FIBULA FRACTURE Left   . TOE AMPUTATION Bilateral    toe hallux   . TONSILLECTOMY AND ADENOIDECTOMY      in  for   Chief Complaint  Patient presents with  . Foot Pain     HPI  Diane [name]  is a 67 y.o. female, group home resident, with past medical history significant for diabetes , peripheral neuropathy, history of fibromyalgia, hypertension and hyperlipidemia presenting with right foot swelling and redness that was noticed only 1 week ago.  Patient reports fever and chills earlier today but not at this time.  Patient was unsure whether it was a bug bite or an infection so she did not seek help until today.  Patient has a history of peripheral vascular disease status post right first toe amputation for diabetic foot ulcer in 2013    Review of Systems    In addition to the HPI above,   No Headache, No changes with Vision or hearing, No problems swallowing food or Liquids, No Chest pain, Cough or Shortness of Breath, No Abdominal pain, No Nausea or Vommitting, Bowel movements  are regular, No Blood in stool or Urine, No dysuria, No new skin rashes or bruises, No new joints pains-aches,  No new weakness, tingling, numbness in any extremity, No recent weight gain or loss, No polyuria, polydypsia or polyphagia,   A full 10 point Review of Systems was done, except as stated above, all other Review of Systems were negative.   Social History Social History   Tobacco Use  . Smoking status: Never Smoker  . Smokeless tobacco: Never Used  Substance Use Topics  . Alcohol use: No     Family History No family history on file.   Prior to Admission medications   Medication Sig Start Date End Date Taking? Authorizing Provider  cetirizine (ZYRTEC) 10 MG tablet Take 1 tablet (10 mg total) by mouth daily. 06/01/17  Yes Argentina Donovan, PA-C  chlorproMAZINE (THORAZINE) 10 MG tablet Take 1 tablet (10 mg total) by mouth at bedtime. 06/29/17  Yes Arfeen, Arlyce Harman, MD  clonazePAM (KLONOPIN) 0.5 MG tablet Take 1 tablet (0.5 mg total) by mouth daily as needed for anxiety. 06/29/17 06/29/18 Yes  Arfeen, Arlyce Harman, MD  diphenhydrAMINE (BENADRYL) 25 MG tablet Take 25 mg by mouth every 6 (six) hours as needed for itching.    Yes [provider]  donepezil (ARICEPT) 5 MG tablet Take 1 tablet (5 mg total) by mouth at bedtime. 05/13/16  Yes Argentina Donovan, PA-C  ergocalciferol (VITAMIN D2) 50000 units capsule Take 1 capsule (50,000 Units total) by mouth once a week. 05/13/16  Yes Freeman Caldron M, PA-C  famotidine (PEPCID) 20 MG tablet Take 1 tablet (20 mg total) by mouth 2 (two) times daily. 09/06/16  Yes Langeland, Dawn T, MD  furosemide (LASIX) 20 MG tablet Take 1 tablet (20 mg total) by mouth daily. 06/01/17  Yes Argentina Donovan, PA-C  gabapentin (NEURONTIN) 300 MG capsule Take 1 capsule (300 mg total) by mouth 3 (three) times daily. 3 times a day when necessary neuropathy pain 06/27/17  Yes Newt Minion, MD  insulin aspart (NOVOLOG) 100 UNIT/ML FlexPen Inject 1 Units into the skin 3 (three) times daily with meals. Sliding scale; 12 units max per day 09/21/16  Yes Langeland, Dawn T, MD  lamoTRIgine (LAMICTAL) 100 MG tablet Take 1 tablet (100 mg total) by mouth daily. 06/29/17  Yes Arfeen, Arlyce Harman, MD  losartan (COZAAR) 50 MG tablet Take 1 tablet (50 mg total) by mouth daily. 09/06/16  Yes Maren Reamer, MD  mupirocin ointment (BACTROBAN) 2 % Apply to any area that appears infected 2X daily 06/01/17  Yes McClung, Dionne Bucy, PA-C  oxybutynin (DITROPAN-XL) 10 MG 24 hr tablet Take 1 tablet (10 mg total) by mouth at bedtime. 09/06/16  Yes Langeland, Dawn T, MD  pravastatin (PRAVACHOL) 20 MG tablet Take 1 tablet (20 mg total) by mouth daily. 09/06/16  Yes Langeland, Dawn T, MD  triamcinolone cream (KENALOG) 0.1 % Apply 1 application topically 2 (two) times daily. 06/01/17  Yes McClung, Angela M, PA-C  Blood Glucose Monitoring Suppl (ACCU-CHEK AVIVA PLUS) w/Device KIT Use as directed for 3 times daily testing of blood glucose. E11.9 09/07/16   Maren Reamer, MD  gabapentin (NEURONTIN) 300 MG capsule  Take 2 capsules (600 mg total) by mouth 3 (three) times daily. Patient not taking: Reported on 07/24/2017 05/28/16   Newt Minion, MD  glucose blood (ACCU-CHEK AVIVA PLUS) test strip Use as instructed for 3 times daily testing of blood glucose. E.11.9 09/26/16   [name] Mussel  T, MD  Insulin Pen Needle (ULTICARE MICRO PEN NEEDLES) 32G X 4 MM MISC 1 applicator by Does not apply route at bedtime. 09/21/16   Maren Reamer, MD    Allergies  Allergen Reactions  . Erythromycin Anaphylaxis  . Biaxin [Clarithromycin] Other (See Comments)  . Feldene [Piroxicam] Diarrhea, Nausea And Vomiting and Other (See Comments)    And sores in mouth  . Nsaids Other (See Comments)    Contraindicated because of her other meds  . Tetracyclines & Related   . Latex Rash  . Penicillins Rash and Other (See Comments)    Has patient had a PCN reaction causing immediate rash, facial/tongue/throat swelling, SOB or lightheadedness with hypotension: Yes Has patient had a PCN reaction causing severe rash involving mucus membranes or skin necrosis: No Has patient had a PCN reaction that required hospitalization No Has patient had a PCN reaction occurring within the last 10 years: No If all of the above answers are "NO", then may proceed with Cephalosporin use. And sores in her mouth   . Tape Rash    Physical Exam  Vitals  Blood pressure (!) 168/85, pulse (!) 101, temperature 99.4 F (37.4 C), temperature source Oral, resp. rate 18, height _0  (1.549 m), weight 94.3 kg (208 lb), SpO2 96 %.   1. General well-developed, well-nourished female, in no acute distress  2. Normal affect and insight, Not Suicidal or Homicidal, Awake Alert, Oriented X 3.  3. No F.N deficits, grossly, patient moving all extremities.  4. Ears and Eyes appear Normal, Conjunctivae clear, PERRLA. Moist Oral Mucosa.  5. Supple Neck, No JVD, No cervical lymphadenopathy appriciated, No Carotid Bruits.  6. Symmetrical Chest wall movement,  Good air movement bilaterally, CTAB.  7. RRR, No Gallops, Rubs or Murmurs, No Parasternal Heave.  8. Positive Bowel Sounds, Abdomen Soft, Non tender, No organomegaly appriciated,No rebound -guarding or rigidity.  9.  No Cyanosis, Normal Skin Turgor, No Skin Rash or Bruise.  10. Good muscle tone, right foot swelling and redness noted, status post first right toe amputation.    Data Review  CBC Recent Labs  Lab 07/24/17 2139  WBC 14.6*  HGB 10.0*  HCT 31.5*  PLT 387  MCV 80.6  MCH 25.6*  MCHC 31.7  RDW 15.4  LYMPHSABS 2.4  MONOABS 0.9  EOSABS 0.5  BASOSABS 0.1   ------------------------------------------------------------------------------------------------------------------  Chemistries  Recent Labs  Lab 07/24/17 2139  NA 138  K 4.3  CL 106  CO2 22  GLUCOSE 134*  BUN 25*  CREATININE 1.63*  CALCIUM 8.7*  AST 16  ALT 10*  ALKPHOS 79  BILITOT 0.3   ------------------------------------------------------------------------------------------------------------------ estimated creatinine clearance is 36.1 mL/min (A) (by C-G formula based on SCr of 1.63 mg/dL (H)). ------------------------------------------------------------------------------------------------------------------ No results for input(s): TSH, T4TOTAL, T3FREE, THYROIDAB in the last 72 hours.  Invalid input(s): FREET3   Coagulation profile No results for input(s): INR, PROTIME in the last 168 hours. ------------------------------------------------------------------------------------------------------------------- No results for input(s): DDIMER in the last 72 hours. -------------------------------------------------------------------------------------------------------------------  Cardiac Enzymes No results for input(s): CKMB, TROPONINI, MYOGLOBIN in the last 168 hours.  Invalid input(s):  CK ------------------------------------------------------------------------------------------------------------------ Invalid input(s): POCBNP   ---------------------------------------------------------------------------------------------------------------  Urinalysis    Component Value Date/Time   BILIRUBINUR negative 05/31/2016 1401   PROTEINUR 100 05/31/2016 1401   UROBILINOGEN 0.2 05/31/2016 1401   NITRITE negative 05/31/2016 1401   LEUKOCYTESUR Negative 05/31/2016 1401    ----------------------------------------------------------------------------------------------------------------   Imaging results:   Dg Foot Complete Right  Result Date: 07/24/2017 CLINICAL  DATA:  Right foot cellulitis after insect bite.  Diabetes. 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 Soft tissue swelling with skin thickening is seen about the foot more so dorsally since prior study. Healed plantar ulceration since prior. Stable appearance of the osseous elements of the foot and ankle without evidence of osteomyelitis. Calcaneal enthesopathy is noted along the plantar dorsal aspect. IMPRESSION: Soft tissue swelling with skin thickening suggestive of cellulitis. No evidence of osteomyelitis nor significant osseous change. Electronically Signed   By: Ashley Royalty M.D.   On: 07/24/2017 22:24      Assessment & Plan  1.  Right foot ulcer/diabetes    Start IV antibiotics: Vancomycin/cefepime/Flagyl 2.  Diabetes mellitus     Insulin sliding scale 3.  History of neuropathy   DVT Prophylaxis Heparin -  Lovenox - SCDs   AM Labs Ordered, also please review Full Orders  .  Code Status Full  Disposition Plan: Back to group home  Time spent in minutes : 42 minutes  Condition GUARDED   _0 @

## 2017-07-24 NOTE — ED Triage Notes (Addendum)
Per EMS-patient was diagnosed with right foot cellulitis today-was seen at PCP office-was not given antibiotics-thinks she was bit by spider-states red and warm to touch-states she noticed symptoms 2 weeks ago

## 2017-07-24 NOTE — ED Notes (Signed)
Pt complained of blood pressure cuff being to tight.

## 2017-07-24 NOTE — Progress Notes (Addendum)
A consult was received from an ED physician for Vancomycin and Zosyn per pharmacy dosing.  The patient's profile has been reviewed for ht/wt/allergies/indication/available labs. Due to penicillin allergy, Zosyn changed to Cefepime 2g IV (has tolerated cephalosporins in the past) and Flagyl 500mg  IV by MD.     A one time order has also been placed for Vancomycin 2g IV.  Further antibiotics/pharmacy consults should be ordered by admitting physician if indicated.                       Thank you, Jamse MeadGadhia, Traven Davids M 07/24/2017  10:07 PM

## 2017-07-25 ENCOUNTER — Other Ambulatory Visit: Payer: Self-pay

## 2017-07-25 ENCOUNTER — Inpatient Hospital Stay (HOSPITAL_COMMUNITY): Payer: Medicare PPO

## 2017-07-25 LAB — BASIC METABOLIC PANEL
Anion gap: 10 (ref 5–15)
BUN: 26 mg/dL — AB (ref 6–20)
CALCIUM: 8 mg/dL — AB (ref 8.9–10.3)
CO2: 20 mmol/L — ABNORMAL LOW (ref 22–32)
CREATININE: 1.64 mg/dL — AB (ref 0.44–1.00)
Chloride: 109 mmol/L (ref 101–111)
GFR calc Af Amer: 37 mL/min — ABNORMAL LOW (ref >60)
GFR, EST NON AFRICAN AMERICAN: 32 mL/min — AB (ref >60)
Glucose, Bld: 131 mg/dL — ABNORMAL HIGH (ref 65–99)
Potassium: 4.3 mmol/L (ref 3.5–5.1)
SODIUM: 139 mmol/L (ref 135–145)

## 2017-07-25 LAB — CBC
HCT: 27.5 % — ABNORMAL LOW (ref 36.0–46.0)
Hemoglobin: 8.6 g/dL — ABNORMAL LOW (ref 12.0–15.0)
MCH: 25.2 pg — ABNORMAL LOW (ref 26.0–34.0)
MCHC: 31.3 g/dL (ref 30.0–36.0)
MCV: 80.6 fL (ref 78.0–100.0)
PLATELETS: 287 10*3/uL (ref 150–400)
RBC: 3.41 MIL/uL — ABNORMAL LOW (ref 3.87–5.11)
RDW: 15.5 % (ref 11.5–15.5)
WBC: 11.7 10*3/uL — ABNORMAL HIGH (ref 4.0–10.5)

## 2017-07-25 LAB — GLUCOSE, CAPILLARY
GLUCOSE-CAPILLARY: 150 mg/dL — AB (ref 65–99)
GLUCOSE-CAPILLARY: 156 mg/dL — AB (ref 65–99)
Glucose-Capillary: 148 mg/dL — ABNORMAL HIGH (ref 65–99)
Glucose-Capillary: 160 mg/dL — ABNORMAL HIGH (ref 65–99)
Glucose-Capillary: 140 mg/dL — ABNORMAL HIGH (ref 65–99)

## 2017-07-25 LAB — HIV ANTIBODY (ROUTINE TESTING W REFLEX): HIV SCREEN 4TH GENERATION: NONREACTIVE

## 2017-07-25 MED ORDER — FAMOTIDINE 20 MG PO TABS
20.0000 mg | ORAL_TABLET | Freq: Two times a day (BID) | ORAL | Status: DC
  Administered 2017-07-25 – 2017-07-28 (×8): 20 mg via ORAL
  Filled 2017-07-25 (×8): qty 1

## 2017-07-25 MED ORDER — CLONAZEPAM 0.5 MG PO TABS
0.5000 mg | ORAL_TABLET | Freq: Every day | ORAL | Status: DC | PRN
  Administered 2017-07-26 (×2): 0.5 mg via ORAL
  Filled 2017-07-25 (×3): qty 1

## 2017-07-25 MED ORDER — SODIUM CHLORIDE 0.9 % IV SOLN
2.0000 g | Freq: Two times a day (BID) | INTRAVENOUS | Status: DC
  Administered 2017-07-25 – 2017-07-27 (×5): 2 g via INTRAVENOUS
  Filled 2017-07-25 (×7): qty 2

## 2017-07-25 MED ORDER — GADOBENATE DIMEGLUMINE 529 MG/ML IV SOLN
10.0000 mL | Freq: Once | INTRAVENOUS | Status: AC | PRN
  Administered 2017-07-25: 10 mL via INTRAVENOUS

## 2017-07-25 MED ORDER — GABAPENTIN 300 MG PO CAPS
300.0000 mg | ORAL_CAPSULE | Freq: Three times a day (TID) | ORAL | Status: DC
  Administered 2017-07-25 – 2017-07-28 (×10): 300 mg via ORAL
  Filled 2017-07-25 (×10): qty 1

## 2017-07-25 MED ORDER — HYDROCODONE-ACETAMINOPHEN 5-325 MG PO TABS
1.0000 | ORAL_TABLET | ORAL | Status: DC | PRN
  Administered 2017-07-25 – 2017-07-28 (×12): 2 via ORAL
  Filled 2017-07-25 (×12): qty 2

## 2017-07-25 MED ORDER — INSULIN ASPART 100 UNIT/ML ~~LOC~~ SOLN: 0.0000 [IU] | Freq: Every day | SUBCUTANEOUS | Status: DC

## 2017-07-25 MED ORDER — VITAMIN D (ERGOCALCIFEROL) 1.25 MG (50000 UNIT) PO CAPS
50000.0000 [IU] | ORAL_CAPSULE | ORAL | Status: DC
  Administered 2017-07-26: 50000 [IU] via ORAL
  Filled 2017-07-25: qty 1

## 2017-07-25 MED ORDER — ONDANSETRON HCL 4 MG/2ML IJ SOLN
4.0000 mg | Freq: Four times a day (QID) | INTRAMUSCULAR | Status: DC | PRN
  Administered 2017-07-27 – 2017-07-28 (×2): 4 mg via INTRAVENOUS
  Filled 2017-07-25 (×2): qty 2

## 2017-07-25 MED ORDER — INSULIN ASPART 100 UNIT/ML ~~LOC~~ SOLN
0.0000 [IU] | Freq: Three times a day (TID) | SUBCUTANEOUS | Status: DC
  Administered 2017-07-25: 2 [IU] via SUBCUTANEOUS
  Administered 2017-07-25: 1 [IU] via SUBCUTANEOUS
  Administered 2017-07-25: 2 [IU] via SUBCUTANEOUS
  Administered 2017-07-26 – 2017-07-27 (×5): 1 [IU] via SUBCUTANEOUS
  Administered 2017-07-27: 2 [IU] via SUBCUTANEOUS

## 2017-07-25 MED ORDER — ZOLPIDEM TARTRATE 5 MG PO TABS
5.0000 mg | ORAL_TABLET | Freq: Every evening | ORAL | Status: DC | PRN
  Administered 2017-07-25: 5 mg via ORAL
  Filled 2017-07-25: qty 1

## 2017-07-25 MED ORDER — DIPHENHYDRAMINE HCL 25 MG PO CAPS: 25.0000 mg | ORAL_CAPSULE | Freq: Four times a day (QID) | ORAL | Status: DC | PRN

## 2017-07-25 MED ORDER — LORATADINE 10 MG PO TABS
10.0000 mg | ORAL_TABLET | Freq: Every day | ORAL | Status: DC
  Administered 2017-07-25 – 2017-07-28 (×4): 10 mg via ORAL
  Filled 2017-07-25 (×4): qty 1

## 2017-07-25 MED ORDER — ACETAMINOPHEN 325 MG PO TABS
650.0000 mg | ORAL_TABLET | Freq: Four times a day (QID) | ORAL | Status: DC | PRN
  Administered 2017-07-27: 650 mg via ORAL
  Filled 2017-07-25: qty 2

## 2017-07-25 MED ORDER — FUROSEMIDE 20 MG PO TABS
20.0000 mg | ORAL_TABLET | Freq: Every day | ORAL | Status: DC
  Administered 2017-07-25 – 2017-07-28 (×4): 20 mg via ORAL
  Filled 2017-07-25 (×5): qty 1

## 2017-07-25 MED ORDER — VANCOMYCIN HCL IN DEXTROSE 1-5 GM/200ML-% IV SOLN
1000.0000 mg | INTRAVENOUS | Status: DC
  Administered 2017-07-26: 1000 mg via INTRAVENOUS
  Filled 2017-07-25 (×2): qty 200

## 2017-07-25 MED ORDER — ONDANSETRON HCL 4 MG PO TABS
4.0000 mg | ORAL_TABLET | Freq: Four times a day (QID) | ORAL | Status: DC | PRN
  Administered 2017-07-28: 4 mg via ORAL
  Filled 2017-07-25: qty 1

## 2017-07-25 MED ORDER — ENOXAPARIN SODIUM 40 MG/0.4ML ~~LOC~~ SOLN
40.0000 mg | Freq: Every day | SUBCUTANEOUS | Status: DC
  Administered 2017-07-25 – 2017-07-27 (×3): 40 mg via SUBCUTANEOUS
  Filled 2017-07-25 (×3): qty 0.4

## 2017-07-25 MED ORDER — SODIUM CHLORIDE 0.9 % IV SOLN
INTRAVENOUS | Status: DC
  Administered 2017-07-25: 18:00:00 via INTRAVENOUS
  Administered 2017-07-25: 1000 mL via INTRAVENOUS

## 2017-07-25 MED ORDER — LOSARTAN POTASSIUM 50 MG PO TABS
50.0000 mg | ORAL_TABLET | Freq: Every day | ORAL | Status: DC
  Administered 2017-07-25 – 2017-07-28 (×4): 50 mg via ORAL
  Filled 2017-07-25 (×4): qty 1

## 2017-07-25 MED ORDER — DONEPEZIL HCL 10 MG PO TABS
5.0000 mg | ORAL_TABLET | Freq: Every day | ORAL | Status: DC
  Administered 2017-07-25 – 2017-07-27 (×4): 5 mg via ORAL
  Filled 2017-07-25 (×4): qty 1

## 2017-07-25 MED ORDER — LAMOTRIGINE 100 MG PO TABS
100.0000 mg | ORAL_TABLET | Freq: Every day | ORAL | Status: DC
  Administered 2017-07-25 – 2017-07-28 (×4): 100 mg via ORAL
  Filled 2017-07-25 (×5): qty 1

## 2017-07-25 MED ORDER — OXYBUTYNIN CHLORIDE ER 5 MG PO TB24
10.0000 mg | ORAL_TABLET | Freq: Every day | ORAL | Status: DC
  Administered 2017-07-25 – 2017-07-27 (×4): 10 mg via ORAL
  Filled 2017-07-25 (×4): qty 2

## 2017-07-25 MED ORDER — METRONIDAZOLE IN NACL 5-0.79 MG/ML-% IV SOLN
500.0000 mg | Freq: Three times a day (TID) | INTRAVENOUS | Status: DC
  Administered 2017-07-25 – 2017-07-27 (×8): 500 mg via INTRAVENOUS
  Filled 2017-07-25 (×9): qty 100

## 2017-07-25 MED ORDER — CHLORPROMAZINE HCL 10 MG PO TABS
10.0000 mg | ORAL_TABLET | Freq: Every day | ORAL | Status: DC
  Administered 2017-07-25 – 2017-07-27 (×4): 10 mg via ORAL
  Filled 2017-07-25 (×4): qty 1

## 2017-07-25 MED ORDER — ACETAMINOPHEN 650 MG RE SUPP: 650.0000 mg | Freq: Four times a day (QID) | RECTAL | Status: DC | PRN

## 2017-07-25 MED ORDER — PRAVASTATIN SODIUM 20 MG PO TABS
20.0000 mg | ORAL_TABLET | Freq: Every day | ORAL | Status: DC
  Administered 2017-07-25 – 2017-07-27 (×3): 20 mg via ORAL
  Filled 2017-07-25 (×2): qty 1

## 2017-07-25 NOTE — Progress Notes (Signed)
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   Assessment & Plan:   Active Problems:   Cellulitis   #) Right lower extremity cellulitis: Patient is mildly febrile and has a high white count.  This is most likely the source.  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  She apparently also has a history of possible peripheral vascular disease. -MRI to rule out osteomyelitis -Continue cefepime and vancomycin, will transition to broad oral coverage and discharge home -Follow-up blood cultures  #) type 2 diabetes:  -sliding scale insulin, before meals at bedtime - Carb restricted diet  #) Hypertension/hyperlipidemia: -Continue losartan 50 mg daily -Continue pravastatin 20 mg daily  #Psych/pain: -Continue gabapentin 600 mg 3 times a day for diabetic neuropathy -Continue oxybutynin extended release 10 mg daily -Continue lamotrigine 100 mg daily -Continue donepezil 5 mg nightly -Continue clonazepam 0.5 mg as needed -Continue chlorpromazine 10 mg nightly  Fluids: IV fluids Left lites: Monitor and supplement Nutrition: Carb restricted diet  Prophylaxis: Enoxaparin  Disposition: Pending improvement of cellulitis and no osteomyelitis on MRI  Full code     Consultants:   None  Procedures: (Don't include imaging studies which can be auto populated. Include things that cannot be auto populated i.e. Echo, Carotid and venous dopplers, Foley, Bipap, HD, tubes/drains, wound vac, central lines etc)  07/25/2017 MRI of right lower  extremity  Antimicrobials: (specify start and planned stop date. Auto populated tables are space occupying and do not give end dates)  IV cefepime and vancomycin started 07/25/2017   Subjective: Patient reports that her right lower extremity cellulitis is improved since admission.  She otherwise reports that she has significant neuropathy and as such does not notice when she has infections.  She does not have a podiatrist.  Objective: Vitals:   07/24/17 2259 07/25/17 0117 07/25/17 0626 07/25/17 1158  BP: (!) 168/85 (!) 160/76 (!) 117/57 131/62  Pulse: (!) 101 (!) 112 92 92  Resp: 18 18 18    Temp:  99.2 F (37.3 C) 97.6 F (36.4 C) 97.6 F (36.4 C)  TempSrc:  Oral Oral Oral  SpO2: 96% 98% 98% 98%  Weight:      Height:        Intake/Output Summary (Last 24 hours) at 07/25/2017 1303 Last data filed at 07/25/2017 1000 Gross per 24 hour  Intake 1825 ml  Output 13 ml  Net 1812 ml   Filed Weights   07/24/17 1841  Weight: 94.3 kg (208 lb)    Examination:  General exam: Appears calm and comfortable  Respiratory system: Clear to auscultation. Respiratory effort normal. Cardiovascular system: Regular rate and rhythm, no murmurs Gastrointestinal system: Soft, nondistended, nontender Central nervous system: Alert and oriented. No focal neurological deficits. Extremities: Left lower extremity noted to have well-healed incisions and what appears to be Charcot joint or surgically fused joint, right lower extremity noted to have absent hallux  Skin: edema of right lower extremity foot and some spreading redness, numerous ulcers over bilateral upper extremity abdomen and skin. Psychiatry: Judgement and insight appear normal. Mood & affect appropriate.     Data Reviewed:  I have personally reviewed following labs and imaging studies  CBC: Recent Labs  Lab 07/24/17 2139 07/25/17 0431  WBC 14.6* 11.7*  NEUTROABS 10.9*  --   HGB 10.0* 8.6*  HCT 31.5* 27.5*  MCV 80.6 80.6  PLT 387  287   Basic Metabolic Panel: Recent Labs  Lab 07/24/17 2139 07/25/17 0431  NA 138 139  K 4.3 4.3  CL 106 109  CO2 22 20*  GLUCOSE 134* 131*  BUN 25* 26*  CREATININE 1.63* 1.64*  CALCIUM 8.7* 8.0*   GFR: Estimated Creatinine Clearance: 35.8 mL/min (A) (by C-G formula based on SCr of 1.64 mg/dL (H)). Liver Function Tests: Recent Labs  Lab 07/24/17 2139  AST 16  ALT 10*  ALKPHOS 79  BILITOT 0.3  PROT 7.9  ALBUMIN 3.2*   No results for input(s): LIPASE, AMYLASE in the last 168 hours. No results for input(s): AMMONIA in the last 168 hours. Coagulation Profile: No results for input(s): INR, PROTIME in the last 168 hours. Cardiac Enzymes: No results for input(s): CKTOTAL, CKMB, CKMBINDEX, TROPONINI in the last 168 hours. BNP (last 3 results) No results for input(s): PROBNP in the last 8760 hours. HbA1C: No results for input(s): HGBA1C in the last 72 hours. CBG: Recent Labs  Lab 07/25/17 0129 07/25/17 0809 07/25/17 1202  GLUCAP 148* 150* 156*   Lipid Profile: No results for input(s): CHOL, HDL, LDLCALC, TRIG, CHOLHDL, LDLDIRECT in the last 72 hours. Thyroid Function Tests: No results for input(s): TSH, T4TOTAL, FREET4, T3FREE, THYROIDAB in the last 72 hours. Anemia Panel: No results for input(s): VITAMINB12, FOLATE, FERRITIN, TIBC, IRON, RETICCTPCT in the last 72 hours. Sepsis Labs: No results for input(s): PROCALCITON, LATICACIDVEN in the last 168 hours.  No results found for this or any previous visit (from the past 240 hour(s)).       Radiology Studies: Dg Foot Complete Right  Result Date: 07/24/2017 CLINICAL DATA:  Right foot cellulitis after insect bite.  Diabetes. 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 Soft tissue swelling with skin thickening is seen about the foot more so dorsally since prior study. Healed plantar ulceration since prior.  Stable appearance of the osseous elements of the foot and ankle without evidence of osteomyelitis. Calcaneal enthesopathy is noted along the plantar dorsal aspect. IMPRESSION: Soft tissue swelling with skin thickening suggestive of cellulitis. No evidence of osteomyelitis nor significant osseous change. Electronically Signed   By: Tollie Ethavid  Kwon M.D.   On: 07/24/2017 22:24        Scheduled Meds: . chlorproMAZINE  10 mg Oral QHS  . donepezil  5 mg Oral QHS  . enoxaparin (LOVENOX) injection  40 mg Subcutaneous Daily  . famotidine  20 mg Oral BID  . furosemide  20 mg Oral Daily  . gabapentin  300 mg Oral TID  . insulin aspart  0-5 Units Subcutaneous QHS  . insulin aspart  0-9 Units Subcutaneous TID WC  . lamoTRIgine  100 mg Oral Daily  . loratadine  10 mg Oral Daily  . losartan  50 mg Oral Daily  . oxybutynin  10 mg Oral QHS  . pravastatin  20 mg Oral q1800  . [START ON 07/26/2017] Vitamin D (Ergocalciferol)  50,000 Units Oral Weekly   Continuous Infusions: . sodium chloride 1,000 mL (07/25/17 0140)  . ceFEPime (MAXIPIME) IV    . metronidazole 500 mg (07/25/17 1100)  . [START ON 07/26/2017] vancomycin  LOS: 1 day    Time spent: 45    Delaine Lame, MD [facility]   If 7PM-7AM, please contact night-coverage [email or web address] Password [email or web address] 07/25/2017, 1:03 PM

## 2017-07-25 NOTE — Progress Notes (Signed)
Pharmacy Antibiotic Note  Diane MillinLinda K Darco is a 67 y.o. female admitted on 07/24/2017 with cellulitis.  Pharmacy has been consulted for Cefepime and Vancomycin dosing.  Plan: Cefepime 2 Gm IV q12h Vancomycin 2 Gm x1 then 1 Gm IV q48h for est AUC=456 Goal AUC = 400-500 F/u scr/cultures/levels  Height: 5\' 1"  (154.9 cm) Weight: 208 lb (94.3 kg) IBW/kg (Calculated) : 47.8  Temp (24hrs), Avg:99.9 F (37.7 C), Min:99.4 F (37.4 C), Max:100.4 F (38 C)  Recent Labs  Lab 07/24/17 2139  WBC 14.6*  CREATININE 1.63*    Estimated Creatinine Clearance: 36.1 mL/min (A) (by C-G formula based on SCr of 1.63 mg/dL (H)).    Allergies  Allergen Reactions  . Erythromycin Anaphylaxis  . Biaxin [Clarithromycin] Other (See Comments)  . Feldene [Piroxicam] Diarrhea, Nausea And Vomiting and Other (See Comments)    And sores in mouth  . Nsaids Other (See Comments)    Contraindicated because of her other meds  . Tetracyclines & Related   . Latex Rash  . Penicillins Rash and Other (See Comments)    Has patient had a PCN reaction causing immediate rash, facial/tongue/throat swelling, SOB or lightheadedness with hypotension: Yes Has patient had a PCN reaction causing severe rash involving mucus membranes or skin necrosis: No Has patient had a PCN reaction that required hospitalization No Has patient had a PCN reaction occurring within the last 10 years: No If all of the above answers are "NO", then may proceed with Cephalosporin use. And sores in her mouth   . Tape Rash    Antimicrobials this admission: 3/18 cefepime >>  3/18 vancomycin >>  3/18 flagyl >>  Dose adjustments this admission:   Microbiology results:  BCx:   UCx:    Sputum:    MRSA PCR:   Thank you for allowing pharmacy to be a part of this patient's care.  Lorenza EvangelistGreen, Jakai Risse R 07/25/2017 12:04 AM

## 2017-07-26 LAB — BASIC METABOLIC PANEL WITH GFR
Anion gap: 7 (ref 5–15)
BUN: 27 mg/dL — ABNORMAL HIGH (ref 6–20)
CO2: 22 mmol/L (ref 22–32)
Chloride: 111 mmol/L (ref 101–111)
Potassium: 4.8 mmol/L (ref 3.5–5.1)

## 2017-07-26 LAB — GLUCOSE, CAPILLARY
GLUCOSE-CAPILLARY: 137 mg/dL — AB (ref 65–99)
GLUCOSE-CAPILLARY: 164 mg/dL — AB (ref 65–99)
Glucose-Capillary: 139 mg/dL — ABNORMAL HIGH (ref 65–99)
Glucose-Capillary: 129 mg/dL — ABNORMAL HIGH (ref 65–99)

## 2017-07-26 LAB — CBC
HCT: 27 % — ABNORMAL LOW (ref 36.0–46.0)
Hemoglobin: 8.3 g/dL — ABNORMAL LOW (ref 12.0–15.0)
MCH: 25.2 pg — ABNORMAL LOW (ref 26.0–34.0)
MCHC: 30.7 g/dL (ref 30.0–36.0)
MCV: 82.1 fL (ref 78.0–100.0)
Platelets: 279 10*3/uL (ref 150–400)
RBC: 3.29 MIL/uL — ABNORMAL LOW (ref 3.87–5.11)
RDW: 15.9 % — ABNORMAL HIGH (ref 11.5–15.5)
WBC: 9.1 10*3/uL (ref 4.0–10.5)

## 2017-07-26 LAB — MAGNESIUM: Magnesium: 1.6 mg/dL — ABNORMAL LOW (ref 1.7–2.4)

## 2017-07-26 LAB — BASIC METABOLIC PANEL
Calcium: 8 mg/dL — ABNORMAL LOW (ref 8.9–10.3)
Creatinine, Ser: 1.7 mg/dL — ABNORMAL HIGH (ref 0.44–1.00)
GFR calc Af Amer: 35 mL/min — ABNORMAL LOW (ref >60)
GFR calc non Af Amer: 30 mL/min — ABNORMAL LOW (ref >60)
Glucose, Bld: 162 mg/dL — ABNORMAL HIGH (ref 65–99)
Sodium: 140 mmol/L (ref 135–145)

## 2017-07-26 MED ORDER — MAGNESIUM SULFATE 2 GM/50ML IV SOLN
2.0000 g | Freq: Once | INTRAVENOUS | Status: AC
  Administered 2017-07-26: 2 g via INTRAVENOUS
  Filled 2017-07-26: qty 50

## 2017-07-26 NOTE — Progress Notes (Signed)
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   Assessment & Plan:   Active Problems:   Cellulitis   #) Right lower extremity cellulitis: Improving with diminished edema -MRI done on 07/25/2017 shows only evidence of cellulitis and small subcentimeter abscesses -Continue cefepime and vancomycin, will transition to broad oral coverage and discharge home -Follow-up blood cultures  #) type 2 diabetes:  -sliding scale insulin, before meals at bedtime - Carb restricted diet  #) Hypertension/hyperlipidemia: -Continue losartan 50 mg daily -Continue pravastatin 20 mg daily  #Psych/pain: -Continue gabapentin 600 mg 3 times a day for diabetic neuropathy -Continue oxybutynin extended release 10 mg daily -Continue lamotrigine 100 mg daily -Continue donepezil 5 mg nightly -Continue clonazepam 0.5 mg as needed -Continue chlorpromazine 10 mg nightly  Fluids: Discontinue IV fluids Left lites: Monitor and supplement Nutrition: Carb restricted diet  Prophylaxis: Enoxaparin  Disposition: Pending further improvement of cellulitis, PT recommendations  Full code     Consultants:   None  Procedures: (Don't include imaging studies which can be auto populated. Include things that cannot be auto populated i.e. Echo, Carotid and venous dopplers, Foley, Bipap, HD, tubes/drains, wound vac, central lines etc)  07/25/2017 MRI of right lower extremity  Antimicrobials: (specify start and planned stop date. Auto populated tables are space occupying and do not give end dates)  IV cefepime and  vancomycin started 07/25/2017   Subjective: Patient reports that her right lower extremity cellulitis and swelling have improved dramatically since admission.  She continues to report pain with ambulation and persistent swelling in the area.  Objective: Vitals:   07/25/17 1158 07/25/17 1413 07/25/17 2207 07/26/17 0520  BP: 131/62 122/66 133/66 (!) 154/79  Pulse: 92 88 87 88  Resp:  18 18 18   Temp: 97.6 F (36.4 C) 98.7 F (37.1 C) 98.7 F (37.1 C) 98.7 F (37.1 C)  TempSrc: Oral Oral Oral Oral  SpO2: 98% 95% 96% 98%  Weight:      Height:        Intake/Output Summary (Last 24 hours) at 07/26/2017 1213 Last data filed at 07/26/2017 1000 Gross per 24 hour  Intake 3370 ml  Output -  Net 3370 ml   Filed Weights   07/24/17 1841  Weight: 94.3 kg (208 lb)    Examination:  General exam: Appears calm and comfortable  Respiratory system: Clear to auscultation. Respiratory effort normal. Cardiovascular system: Regular rate and rhythm, no murmurs Gastrointestinal system: Soft, nondistended, nontender Central nervous system: Alert and oriented. No focal neurological deficits. Extremities: Left lower extremity noted to have well-healed incisions and what appears to be Charcot joint or surgically fused joint, right lower extremity noted to have absent hallux  Skin: Minimal erythema of right foot, 1+ edema across foot, chronic calluses noted on plantar surface, numerous bedbug bites noted on upper extremity abdomen and skin. Psychiatry: Judgement and insight appear normal. Mood & affect appropriate.     Data Reviewed: I have personally reviewed following labs and imaging studies  CBC: Recent Labs  Lab 07/24/17 2139 07/25/17 0431 07/26/17 0514  WBC 14.6* 11.7* 9.1  NEUTROABS 10.9*  --   --   HGB 10.0* 8.6* 8.3*  HCT 31.5* 27.5* 27.0*  MCV 80.6 80.6 82.1  PLT 387 287 279   Basic Metabolic Panel: Recent Labs  Lab 07/24/17 2139 07/25/17 0431 07/26/17 0514  NA 138 139 140    K 4.3 4.3 4.8  CL 106 109 111  CO2 22 20* 22  GLUCOSE 134* 131* 162*  BUN 25* 26* 27*  CREATININE 1.63* 1.64* 1.70*  CALCIUM 8.7* 8.0* 8.0*  MG  --   --  1.6*   GFR: Estimated Creatinine Clearance: 34.6 mL/min (A) (by C-G formula based on SCr of 1.7 mg/dL (H)). Liver Function Tests: Recent Labs  Lab 07/24/17 2139  AST 16  ALT 10*  ALKPHOS 79  BILITOT 0.3  PROT 7.9  ALBUMIN 3.2*   No results for input(s): LIPASE, AMYLASE in the last 168 hours. No results for input(s): AMMONIA in the last 168 hours. Coagulation Profile: No results for input(s): INR, PROTIME in the last 168 hours. Cardiac Enzymes: No results for input(s): CKTOTAL, CKMB, CKMBINDEX, TROPONINI in the last 168 hours. BNP (last 3 results) No results for input(s): PROBNP in the last 8760 hours. HbA1C: No results for input(s): HGBA1C in the last 72 hours. CBG: Recent Labs  Lab 07/25/17 1202 07/25/17 1625 07/25/17 2158 07/26/17 0744 07/26/17 1146  GLUCAP 156* 160* 140* 137* 139*   Lipid Profile: No results for input(s): CHOL, HDL, LDLCALC, TRIG, CHOLHDL, LDLDIRECT in the last 72 hours. Thyroid Function Tests: No results for input(s): TSH, T4TOTAL, FREET4, T3FREE, THYROIDAB in the last 72 hours. Anemia Panel: No results for input(s): VITAMINB12, FOLATE, FERRITIN, TIBC, IRON, RETICCTPCT in the last 72 hours. Sepsis Labs: No results for input(s): PROCALCITON, LATICACIDVEN in the last 168 hours.  No results found for this or any previous visit (from the past 240 hour(s)).       Radiology Studies: Mr Foot Right W Wo Contrast  Result Date: 07/25/2017 CLINICAL DATA:  Forefoot soft tissue swelling. Swelling of the second toe with toe turning blue per patient. EXAM: MRI OF THE RIGHT FOREFOOT WITHOUT AND WITH CONTRAST TECHNIQUE: Multiplanar, multisequence MR imaging of the right forefoot was performed before and after the administration of intravenous contrast. 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 Redemonstration of subchondral degenerative cystic change of the midfoot articulations without acute osseous appearing abnormality nor frank bone destruction. Degenerative joint space narrowing and subchondral reactive edema is noted about the calcaneocuboid, naviculocuneiform, intercuneiform and base of the TMT joints. Osteoarthritic joint space narrowing of the DIP and PIP joints of the second through fifth digits. No conclusive evidence for acute osteomyelitis. Ligaments Noncontributory Muscles and Tendons Myositis of the plantar flexor muscles of the forefoot. No evidence of a pyomyositis. Soft tissues Two small dorsal soft tissue abscesses measuring 9 x 5 x 5 mm and 15 x 5 x 10 mm are identified along the lateral as well as dorsal aspect of the proximal second proximal phalanx. Moderate degree of dorsal soft tissue edema consistent with cellulitis is identified of the forefoot. IMPRESSION: 1. Two small enhancing soft tissue abscesses overlying the dorsum aspect of the left second distal phalanx and within the webspace between the second and third toes measuring 9 x 5 x 5 mm in the webspace and 15 x 5 x 10 mm over the dorsum of the toe without underlying marrow signal abnormalities to suggest acute osteomyelitis. 2. Moderate degree of soft tissue edema and  swelling compatible cellulitis. 3. Redemonstration of great toe amputation with overlying cellulitis. 4. Redemonstration of mild to moderate midfoot osteoarthritic joint space narrowing and reactive subchondral marrow edema and cystic change. 5. Mild osteoarthritic joint space narrowing of the DIP and PIP joints of the remaining second through fifth digits. Electronically Signed   By: Tollie Eth M.D.   On: 07/25/2017 21:49   Dg Foot Complete Right  Result Date: 07/24/2017 CLINICAL DATA:  Right foot  cellulitis after insect bite.  Diabetes. 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 Soft tissue swelling with skin thickening is seen about the foot more so dorsally since prior study. Healed plantar ulceration since prior. Stable appearance of the osseous elements of the foot and ankle without evidence of osteomyelitis. Calcaneal enthesopathy is noted along the plantar dorsal aspect. IMPRESSION: Soft tissue swelling with skin thickening suggestive of cellulitis. No evidence of osteomyelitis nor significant osseous change. Electronically Signed   By: Tollie Eth M.D.   On: 07/24/2017 22:24        Scheduled Meds: . chlorproMAZINE  10 mg Oral QHS  . donepezil  5 mg Oral QHS  . enoxaparin (LOVENOX) injection  40 mg Subcutaneous Daily  . famotidine  20 mg Oral BID  . furosemide  20 mg Oral Daily  . gabapentin  300 mg Oral TID  . insulin aspart  0-5 Units Subcutaneous QHS  . insulin aspart  0-9 Units Subcutaneous TID WC  . lamoTRIgine  100 mg Oral Daily  . loratadine  10 mg Oral Daily  . losartan  50 mg Oral Daily  . oxybutynin  10 mg Oral QHS  . pravastatin  20 mg Oral q1800  . Vitamin D (Ergocalciferol)  50,000 Units Oral Weekly   Continuous Infusions: . sodium chloride 75 mL/hr at 07/25/17 1745  . ceFEPime (MAXIPIME) IV Stopped (07/26/17 0131)  . metronidazole Stopped (07/26/17 1051)  . vancomycin       LOS: 2 days    Time spent: 45    Delaine Lame, MD [facility]   If 7PM-7AM, please contact night-coverage [email or web address] Password [facility] 07/26/2017, 12:13 PM

## 2017-07-27 ENCOUNTER — Ambulatory Visit (INDEPENDENT_AMBULATORY_CARE_PROVIDER_SITE_OTHER): Payer: Medicare PPO | Admitting: Orthopedic Surgery

## 2017-07-27 DIAGNOSIS — L03031 Cellulitis of right toe: Secondary | ICD-10-CM

## 2017-07-27 LAB — GLUCOSE, CAPILLARY
GLUCOSE-CAPILLARY: 168 mg/dL — AB (ref 65–99)
Glucose-Capillary: 135 mg/dL — ABNORMAL HIGH (ref 65–99)
Glucose-Capillary: 122 mg/dL — ABNORMAL HIGH (ref 65–99)
Glucose-Capillary: 164 mg/dL — ABNORMAL HIGH (ref 65–99)

## 2017-07-27 NOTE — Progress Notes (Signed)
Patient is refusing any IV antibiotics at this time.  States, " I remember that patients have a right to refuse medications and I'm refusing anymore IV medications. I'm supposed to be on pills now." Text page sent to attending MD; awaiting response.

## 2017-07-27 NOTE — Clinical Social Work Note (Signed)
Clinical Social Work Assessment  Patient Details  Name: Diane [name] MRN: [id number] Date of Birth: 1950/10/01  Date of referral:  07/27/17               Reason for consult:  Facility Placement                Permission sought to share information with:  Family Supports Permission granted to share information::  Yes, Verbal Permission Granted  Name::        Agency::  SNF  Relationship::     Contact Information:     Housing/Transportation Living arrangements for the past 2 months:  Apartment Source of Information:  Patient Patient Interpreter Needed:  None Criminal Activity/Legal Involvement Pertinent to Current Situation/Hospitalization:  No - Comment as needed Significant Relationships:  None Lives with:  Self Do you feel safe going back to the place where you live?  No Need for family participation in patient care:  No  Care giving concerns:   Patient is concern about falling in her home due to her unsteady gait.   Social Worker assessment / plan:  CSW met with the patient at bedside to discuss discharge plan. Physical therapy recommends SNF placement. During session patient reports feeling weak and unsteady with her walker. Patient reports she lives in a small apartment and at times has difficulty moving around. Patient reports she uses a shower chair to bathe. She can feed and dress herself. Patient reports she does not have family in area, they live out of town.  CSW explain SNF process and  insurance authorization process. She reports understanding.    Plan: Provide SNF bed offers/ Insurance Authorization started.  Patient agreeable to [facility]  Employment status:  [employer] information:  Medicare PT Recommendations:  [place] / Referral to community resources:  [place]  Patient/Family's Response to care:  Agreeable and Responding well to care.   Patient/Family's Understanding of and Emotional  Response to Diagnosis, Current Treatment, and Prognosis: Patient is knowledgeable of diagnosis and need for SNF rehab to gain strength before going home.   Emotional Assessment Appearance:  Appears stated age Attitude/Demeanor/Rapport:    Affect (typically observed):  Accepting Orientation:  Oriented to Self, Oriented to Place, Oriented to  Time, Oriented to Situation Alcohol / Substance use:  Not Applicable Psych involvement (Current and /or in the community):  No (Comment)  Discharge Needs  Concerns to be addressed:  Discharge Planning Concerns Readmission within the last 30 days:  No Current discharge risk:  Dependent with Mobility Barriers to Discharge:  Continued Medical Work up   [employer], LCSW 07/27/2017, 1:40 PM

## 2017-07-27 NOTE — Evaluation (Signed)
Physical Therapy Evaluation Patient Details Name: Diane [name] Witham MRN: [id number] DOB: 20-Aug-1950 Today's Date: 07/27/2017   History of Present Illness  67 yo female admitted with R LE cellultis, R foot ulcer. Hx of chronic back pain, L tib/fib ORIF, depression, neuropathy, fibromyalgia, R great toe ampuation, Charcot foot, PVD, DM, L calcaneal ostomy 2017    Clinical Impression  On eval, pt required Min assist for mobility. She walked ~60 feet with a 4 wheeled walker. Audible wheezing noted with all activity. Pt is unsteady especially with ambulation. She presents with general weakness, decreased activity tolerance, and impaired gait and balance. Discussed d/c plan-pt stated she did not feel like she could manage at home alone. She is open to ST rehab if possible. If SNF is not an option, would recommend setting pt up with as much help as she could have at home.     Follow Up Recommendations SNF(if not an option, then will need HHPT, HHOT, Home Health Aide for home)    Equipment Recommendations  None recommended by PT    Recommendations for Other Services       Precautions / Restrictions Precautions Precautions: Fall Restrictions Weight Bearing Restrictions: No      Mobility  Bed Mobility Overal bed mobility: Needs Assistance Bed Mobility: Supine to Sit;Sit to Supine     Supine to sit: Supervision Sit to supine: Supervision   General bed mobility comments: for lines, safety. Increased time.   Transfers Overall transfer level: Needs assistance Equipment used: 4-wheeled walker Transfers: Sit to/from Stand Sit to Stand: Min guard         General transfer comment: close guard for safety. Pt unsteady.   Ambulation/Gait Ambulation/Gait assistance: Min assist Ambulation Distance (Feet): 60 Feet Assistive device: 4-wheeled walker Gait Pattern/deviations: Step-through pattern;Decreased stride length;Staggering left;Staggering right;Drifts right/left     General Gait  Details: Assist to stabilize pt throughout ambulation distance. Audible wheezing noted immediately with activity. Pt is unsteady.   Stairs            Wheelchair Mobility    Modified Rankin (Stroke Patients Only)       Balance Overall balance assessment: Needs assistance         Standing balance support: Bilateral upper extremity supported Standing balance-Leahy Scale: Poor                               Pertinent Vitals/Pain Pain Assessment: 0-10 Pain Score: 8  Pain Location: R LE/foot, chest with activity Pain Descriptors / Indicators: Sore;Aching Pain Intervention(s): Limited activity within patient's tolerance    Home Living Family/patient expects to be discharged to:: Private residence Living Arrangements: Alone   Type of Home: Apartment Home Access: Level entry     Home Layout: One level Home Equipment: Environmental consultantWalker - 4 wheels;Bedside commode;Tub bench      Prior Function Level of Independence: Independent with assistive device(s)         Comments: uses rollator     Hand Dominance        Extremity/Trunk Assessment   Upper Extremity Assessment Upper Extremity Assessment: Generalized weakness    Lower Extremity Assessment Lower Extremity Assessment: Generalized weakness(history of peripheral neuropathy)    Cervical / Trunk Assessment Cervical / Trunk Assessment: Normal  Communication   Communication: No difficulties  Cognition Arousal/Alertness: Awake/alert Behavior During Therapy: WFL for tasks assessed/performed Overall Cognitive Status: Within Functional Limits for tasks assessed  General Comments      Exercises     Assessment/Plan    PT Assessment Patient needs continued PT services  PT Problem List Decreased mobility;Decreased activity tolerance;Decreased strength;Decreased balance;Pain       PT Treatment Interventions Gait training;Functional mobility  training;Therapeutic activities;Balance training;Patient/family education;Therapeutic exercise    PT Goals (Current goals can be found in the Care Plan section)  Acute Rehab PT Goals Patient Stated Goal: to get some help PT Goal Formulation: With patient Time For Goal Achievement: 08/10/17 Potential to Achieve Goals: Good    Frequency Min 3X/week   Barriers to discharge        Co-evaluation               AM-PAC PT "6 Clicks" Daily Activity  Outcome Measure Difficulty turning over in bed (including adjusting bedclothes, sheets and blankets)?: A Little Difficulty moving from lying on back to sitting on the side of the bed? : A Little Difficulty sitting down on and standing up from a chair with arms (e.g., wheelchair, bedside commode, etc,.)?: A Little Help needed moving to and from a bed to chair (including a wheelchair)?: A Little Help needed walking in hospital room?: A Little Help needed climbing 3-5 steps with a railing? : A Little 6 Click Score: 18    End of Session Equipment Utilized During Treatment: Gait belt Activity Tolerance: Patient limited by fatigue(distance limited by pt c/o chest pain) Patient left: in bed;with call bell/phone within reach   PT Visit Diagnosis: Muscle weakness (generalized) (M62.81);Difficulty in walking, not elsewhere classified (R26.2);Pain Pain - Right/Left: Right Pain - part of body: Ankle and joints of foot    Time: 1610-9604 PT Time Calculation (min) (ACUTE ONLY): 22 min   Charges:   PT Evaluation $PT Eval Moderate Complexity: 1 Mod     PT G Codes:          Rebeca Alert, MPT Pager: [phone]

## 2017-07-27 NOTE — NC FL2 (Addendum)
[place] MEDICAID FL2 LEVEL OF CARE SCREENING TOOL     IDENTIFICATION  Patient Name: Diane [name] Birthdate: [birth year] Sex: female Admission Date (Current Location): 07/24/2017  [facility] and [place] Number:  Producer, television/film/video and Address:  [facility],  [address], [place] [address]      Provider Number: [id number]  Attending Physician Name and Address:  Shon Hale, MD  Relative Name and Phone Number:       Current Level of Care: Hospital Recommended Level of Care: Skilled Nursing Facility Prior Approval Number:    Date Approved/Denied:   PASRR Number:    Discharge Plan: SNF    Current Diagnoses: Patient Active Problem List   Diagnosis Date Noted  . Cellulitis 07/24/2017  . Pain in left leg 04/20/2017  . Charcot foot due to diabetes mellitus (HCC) 02/21/2017  . Unilateral primary osteoarthritis, right knee 09/07/2016  . Pain in right hip 09/07/2016  . Vitamin D deficiency 07/25/2016  . Diabetic ulcer of right heel associated with type 2 diabetes mellitus, limited to breakdown of skin (HCC) 05/12/2016  . Osteomyelitis of ankle or foot   . 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 Right foot ulcer, limited to breakdown of skin (HCC) 04/26/2016  . Hypertension 04/26/2016  . DM (diabetes mellitus) type II controlled with renal manifestation (HCC) 04/26/2016  . Fibromyalgia 04/26/2016  . Diabetic polyneuropathy (HCC) 04/26/2016    Orientation RESPIRATION BLADDER Height & Weight     Self, Time, Situation, Place  Normal Continent Weight: 208 lb (94.3 kg) Height:  5\' 1"  (154.9 cm)  BEHAVIORAL SYMPTOMS/MOOD NEUROLOGICAL BOWEL NUTRITION STATUS      Continent Diet(Carb Modified. )  AMBULATORY STATUS COMMUNICATION OF NEEDS Skin   Extensive Assist Verbally Other (Comment) Cellulitis of Lower Extremities and Toe                        Personal  Care Assistance Level of Assistance  Bathing, Feeding, Dressing Bathing Assistance: Limited assistance Feeding assistance: Independent Dressing Assistance: Limited assistance     Functional Limitations Info  Sight, Hearing, Speech Sight Info: Adequate Hearing Info: Adequate Speech Info: Adequate    SPECIAL CARE FACTORS FREQUENCY  PT (By licensed PT), OT (By licensed OT)     PT Frequency: 5x/week OT Frequency: 5x/week            Contractures Contractures Info: Not present    Additional Factors Info  Code Status, Allergies, Psychotropic, Insulin Sliding Scale Code Status Info: Fullcode Allergies Info: Allergies: Erythromycin, Biaxin Clarithromycin, Feldene Piroxicam, Nsaids, Tetracyclines & Related, Latex, Penicillins, Tape Psychotropic Info: Lamictal  Insulin Sliding Scale Info: Yes, see discharge summary        Current Medications (07/27/2017):  This is the current hospital active medication list Current Facility-Administered Medications  Medication Dose Route Frequency Provider Last Rate Last Dose  . acetaminophen (TYLENOL) tablet 650 mg  650 mg Oral Q6H PRN Carron Curie, MD       Or  . acetaminophen (TYLENOL) suppository 650 mg  650 mg Rectal Q6H PRN Carron Curie, MD      . ceFEPIme (MAXIPIME) 2 g in sodium chloride 0.9 % 100 mL IVPB  2 g Intravenous Q12H Lorenza Evangelist, RPH 200 mL/hr at 07/27/17 1300 2 g at 07/27/17 1300  . chlorproMAZINE (THORAZINE) tablet 10 mg  10 mg Oral QHS Carron Curie, MD  10 mg at 07/26/17 2102  . clonazePAM (KLONOPIN) tablet 0.5 mg  0.5 mg Oral Daily PRN Carron CurieHijazi, Ali, MD   0.5 mg at 07/26/17 2052  . diphenhydrAMINE (BENADRYL) capsule 25 mg  25 mg Oral Q6H PRN Carron CurieHijazi, Ali, MD      . donepezil (ARICEPT) tablet 5 mg  5 mg Oral QHS Carron CurieHijazi, Ali, MD   5 mg at 07/26/17 2102  . enoxaparin (LOVENOX) injection 40 mg  40 mg Subcutaneous Daily Carron CurieHijazi, Ali, MD   40 mg at 07/27/17 0905  . famotidine (PEPCID) tablet 20 mg  20 mg Oral BID Carron CurieHijazi, Ali, MD    20 mg at 07/27/17 [id number]  . furosemide (LASIX) tablet 20 mg  20 mg Oral Daily Carron CurieHijazi, Ali, MD   20 mg at 07/27/17 0906  . gabapentin (NEURONTIN) capsule 300 mg  300 mg Oral TID Carron CurieHijazi, Ali, MD   300 mg at 07/27/17 0906  . HYDROcodone-acetaminophen (NORCO/VICODIN) 5-325 MG per tablet 1-2 tablet  1-2 tablet Oral Q4H PRN Carron CurieHijazi, Ali, MD   2 tablet at 07/27/17 0905  . insulin aspart (novoLOG) injection 0-5 Units  0-5 Units Subcutaneous QHS Carron CurieHijazi, Ali, MD      . insulin aspart (novoLOG) injection 0-9 Units  0-9 Units Subcutaneous TID WC Carron CurieHijazi, Ali, MD   1 Units at 07/27/17 1130  . lamoTRIgine (LAMICTAL) tablet 100 mg  100 mg Oral Daily Carron CurieHijazi, Ali, MD   100 mg at 07/27/17 0905  . loratadine (CLARITIN) tablet 10 mg  10 mg Oral Daily Carron CurieHijazi, Ali, MD   10 mg at 07/27/17 0906  . losartan (COZAAR) tablet 50 mg  50 mg Oral Daily Carron CurieHijazi, Ali, MD   50 mg at 07/27/17 0906  . metroNIDAZOLE (FLAGYL) IVPB 500 mg  500 mg Intravenous Q8H Carron CurieHijazi, Ali, MD   Stopped at 07/27/17 1120  . ondansetron (ZOFRAN) tablet 4 mg  4 mg Oral Q6H PRN Carron CurieHijazi, Ali, MD       Or  . ondansetron (ZOFRAN) injection 4 mg  4 mg Intravenous Q6H PRN Carron CurieHijazi, Ali, MD      . oxybutynin (DITROPAN-XL) 24 hr tablet 10 mg  10 mg Oral QHS Carron CurieHijazi, Ali, MD   10 mg at 07/26/17 2102  . pravastatin (PRAVACHOL) tablet 20 mg  20 mg Oral q1800 Carron CurieHijazi, Ali, MD   20 mg at 07/26/17 1730  . vancomycin (VANCOCIN) IVPB 1000 mg/200 mL premix  1,000 mg Intravenous Q48H Lorenza EvangelistGreen, Elizabeth [facility], [facility]RPH   Stopped at 07/26/17 2206  . Vitamin D (Ergocalciferol) (DRISDOL) capsule 50,000 Units  50,000 Units Oral Weekly Carron CurieHijazi, Ali, MD   50,000 Units at 07/26/17 1051  . zolpidem (AMBIEN) tablet 5 mg  5 mg Oral QHS PRN Carron CurieHijazi, Ali, MD   5 mg at 07/25/17 0150     Discharge Medications: Please see discharge summary for a list of discharge medications.  Relevant Imaging Results:  Relevant Lab Results:   Additional Information SSN:[id number]  Clearance CootsNicole A Markisha Meding, LCSW

## 2017-07-27 NOTE — Care Management Important Message (Signed)
Important Message  Patient Details  Name: Diane [name] MRN: [id number] Date of Birth: 1951-04-29   Medicare Important Message Given:  Yes    Caren MacadamFuller, Kaylyn Garrow 07/27/2017, 10:18 AMImportant Message  Patient Details  Name: Diane [name] MRN: [id number] Date of Birth: 1951-04-29   Medicare Important Message Given:  Yes    Caren MacadamFuller, Nitzia Perren 07/27/2017, 10:17 AM

## 2017-07-27 NOTE — Progress Notes (Signed)
Patient Demographics:    Diane [name], is a 67 y.o. female, DOB - [birth year], ZOX:[id number]  Admit date - 07/24/2017   Admitting Physician Carron Curie, MD  Outpatient Primary MD for the patient is Marcine Matar, MD  LOS - 3  Chief Complaint  Patient presents with  . Foot Pain        Subjective:    Diane [name] today has no fevers, no emesis,  No chest pain,  Rt foot pain persist   Assessment  & Plan :    Active Problems:   Cellulitis  67 year old with past medical history relevant for type 2 diabetes on oral hypoglycemics, gated by neuropathy and likely Charcot joints, hypertension, hyperlipidemia, obstructive sleep apnea, fibromyalgia, anxiety/depression who comes in with right lower extremity cellulitis concerning for diabetic foot infection.   Assessment & Plan:   Active Problems:   Cellulitis   #) Right lower extremity cellulitis:  Improving swelling, redness and pain, continue Vanco and cefepime started on 07/25/2017 pending blood culture, -MRI done on 07/25/2017 shows only evidence of cellulitis and small subcentimeter abscesses, leukocytosis is resolved   #) type 2 diabetes:  Use Novolog/Humalog Sliding scale insulin with Accu-Cheks/Fingersticks as ordered    #) Hypertension/hyperlipidemia: Stable, -Continue losartan 50 mg daily, -Continue pravastatin 20 mg daily  #Psych/MOOD/pain:-Continue home pain medication including gabapentin 600 mg 3 times a day for diabetic neuropathy,-Continue lamotrigine 100 mg daily, -Continue donepezil 5 mg nightly, -Continue clonazepam 0.5 mg as needed, -Continue chlorpromazine 10 mg nightly  #Anemia-monitor H&H closely, patient has history of chronic anemia  Prophylaxis: Enoxaparin  Disposition:  Awaiting transfer to skilled nursing facility for rehab  Full code  Lab Results  Component Value Date   PLT 279 07/26/2017     Inpatient Medications  Scheduled Meds: . chlorproMAZINE  10 mg Oral QHS  . donepezil  5 mg Oral QHS  . enoxaparin (LOVENOX) injection  40 mg Subcutaneous Daily  . famotidine  20 mg Oral BID  . furosemide  20 mg Oral Daily  . gabapentin  300 mg Oral TID  . insulin aspart  0-5 Units Subcutaneous QHS  . insulin aspart  0-9 Units Subcutaneous TID WC  . lamoTRIgine  100 mg Oral Daily  . loratadine  10 mg Oral Daily  . losartan  50 mg Oral Daily  . oxybutynin  10 mg Oral QHS  . pravastatin  20 mg Oral q1800  . Vitamin D (Ergocalciferol)  50,000 Units Oral Weekly   Continuous Infusions: . ceFEPime (MAXIPIME) IV Stopped (07/27/17 1611)  . metronidazole Stopped (07/27/17 1120)  . vancomycin Stopped (07/26/17 2206)   PRN Meds:.acetaminophen **OR** acetaminophen, clonazePAM, diphenhydrAMINE, HYDROcodone-acetaminophen, ondansetron **OR** ondansetron (ZOFRAN) IV, zolpidem   Anti-infectives (From admission, onward)   Start     Dose/Rate Route Frequency Ordered Stop   07/26/17 2200  vancomycin (VANCOCIN) IVPB 1000 mg/200 mL premix     1,000 mg 200 mL/hr over 60 Minutes Intravenous Every 48 hours 07/25/17 0008     07/25/17 1400  ceFEPIme (MAXIPIME) 2 g in sodium chloride 0.9 % 100 mL IVPB     2 g 200 mL/hr over 30 Minutes Intravenous Every 12 hours 07/25/17 0205     07/25/17 0200  metroNIDAZOLE (FLAGYL) IVPB 500 mg  500 mg 100 mL/hr over 60 Minutes Intravenous Every 8 hours 07/25/17 0107     07/24/17 2230  metroNIDAZOLE (FLAGYL) IVPB 500 mg  Status:  Discontinued     500 mg 100 mL/hr over 60 Minutes Intravenous STAT 07/24/17 2217 07/25/17 0116   07/24/17 2230  ceFEPIme (MAXIPIME) 2 g in sodium chloride 0.9 % 100 mL IVPB     2 g 200 mL/hr over 30 Minutes Intravenous STAT 07/24/17 2217 07/25/17 0211   07/24/17 2215  vancomycin (VANCOCIN) 2,000 mg in sodium chloride 0.9 % 500 mL IVPB     2,000 mg 250 mL/hr over 120 Minutes Intravenous STAT 07/24/17 2201 07/25/17 0054         Objective:   Vitals:   07/26/17 2103 07/26/17 2233 07/27/17 0623 07/27/17 1315  BP: (!) 158/85  (!) 156/77 (!) 145/72  Pulse: 82  83 92  Resp: 18  18 18   Temp: 99.3 F (37.4 C) 98.8 F (37.1 C) 98.5 F (36.9 C) 98.8 F (37.1 C)  TempSrc: Oral Oral Oral Oral  SpO2: 95%  95% 95%  Weight:      Height:        Wt Readings from Last 3 Encounters:  07/24/17 94.3 kg (208 lb)  07/24/17 94.6 kg (208 lb 9.6 oz)  06/27/17 99.6 kg (219 lb 9.6 oz)     Intake/Output Summary (Last 24 hours) at 07/27/2017 1732 Last data filed at 07/27/2017 1600 Gross per 24 hour  Intake 2610 ml  Output 1100 ml  Net 1510 ml     Physical Exam  Gen:- Awake Alert,  In no apparent distress  HEENT:- [place].AT, No sclera icterus Neck-Supple Neck,No JVD,.  Lungs-  CTAB , no wheezing CV- S1, S2 normal Abd-  +ve B.Sounds, Abd Soft, No tenderness,    Extremity/Skin:-Right foot with status post prior right big toe amputation, warmth, tenderness, swelling appears to be improving slowly, scars from previous surgical procedures over the right foot  psych-affect is appropriate, oriented x3 Neuro-no new focal deficits, no tremors   Data Review:   Micro Results Recent Results (from the past 240 hour(s))  Culture, blood (single)     Status: None (Preliminary result)   Collection Time: 07/24/17 11:03 PM  Result Value Ref Range Status   Specimen Description   Final    BLOOD LEFT ANTECUBITAL Performed at [facility], [address] [place], [place] [address]    Special Requests   Final    BOTTLES DRAWN AEROBIC AND ANAEROBIC Blood Culture adequate volume Performed at [facility], [address], [place], [place] [address]    Culture   Final    NO GROWTH 2 DAYS Performed at [facility], [address], [place], [place] [address]    Report Status PENDING  Incomplete    Radiology Reports Mr Foot Right W Wo Contrast  Result Date: 07/25/2017 CLINICAL DATA:  Forefoot  soft tissue swelling. Swelling of the second toe with toe turning blue per patient. EXAM: MRI OF THE RIGHT FOREFOOT WITHOUT AND WITH CONTRAST TECHNIQUE: Multiplanar, multisequence MR imaging of the right forefoot was performed before and after the administration of intravenous contrast. CONTRAST:  10mL MULTIHANCE GADOBENATE DIMEGLUMINE 529 MG/ML IV SOLN COMPARISON:  Same day radiographs FINDINGS: Bones/Joint/Cartilage Previous great toe amputation at the level of the MTP. Redemonstration of subchondral degenerative cystic change of the midfoot articulations without acute osseous appearing abnormality nor frank bone destruction. Degenerative joint space narrowing and subchondral reactive edema  is noted about the calcaneocuboid, naviculocuneiform, intercuneiform and base of the TMT joints. Osteoarthritic joint space narrowing of the DIP and PIP joints of the second through fifth digits. No conclusive evidence for acute osteomyelitis. Ligaments Noncontributory Muscles and Tendons Myositis of the plantar flexor muscles of the forefoot. No evidence of a pyomyositis. Soft tissues Two small dorsal soft tissue abscesses measuring 9 x 5 x 5 mm and 15 x 5 x 10 mm are identified along the lateral as well as dorsal aspect of the proximal second proximal phalanx. Moderate degree of dorsal soft tissue edema consistent with cellulitis is identified of the forefoot. IMPRESSION: 1. Two small enhancing soft tissue abscesses overlying the dorsum aspect of the left second distal phalanx and within the webspace between the second and third toes measuring 9 x 5 x 5 mm in the webspace and 15 x 5 x 10 mm over the dorsum of the toe without underlying marrow signal abnormalities to suggest acute osteomyelitis. 2. Moderate degree of soft tissue edema and swelling compatible cellulitis. 3. Redemonstration of great toe amputation with overlying cellulitis. 4. Redemonstration of mild to moderate midfoot osteoarthritic joint space narrowing and  reactive subchondral marrow edema and cystic change. 5. Mild osteoarthritic joint space narrowing of the DIP and PIP joints of the remaining second through fifth digits. Electronically Signed   By: Tollie Ethavid  Kwon M.D.   On: 07/25/2017 21:49   Dg Foot Complete Right  Result Date: 07/24/2017 CLINICAL DATA:  Right foot cellulitis after insect bite.  Diabetes. 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 Soft tissue swelling with skin thickening is seen about the foot more so dorsally since prior study. Healed plantar ulceration since prior. Stable appearance of the osseous elements of the foot and ankle without evidence of osteomyelitis. Calcaneal enthesopathy is noted along the plantar dorsal aspect. IMPRESSION: Soft tissue swelling with skin thickening suggestive of cellulitis. No evidence of osteomyelitis nor significant osseous change. Electronically Signed   By: Tollie Ethavid  Kwon M.D.   On: 07/24/2017 22:24     CBC Recent Labs  Lab 07/24/17 2139 07/25/17 0431 07/26/17 0514  WBC 14.6* 11.7* 9.1  HGB 10.0* 8.6* 8.3*  HCT 31.5* 27.5* 27.0*  PLT 387 287 279  MCV 80.6 80.6 82.1  MCH 25.6* 25.2* 25.2*  MCHC 31.7 31.3 30.7  RDW 15.4 15.5 15.9*  LYMPHSABS 2.4  --   --   MONOABS 0.9  --   --   EOSABS 0.5  --   --   BASOSABS 0.1  --   --     Chemistries  Recent Labs  Lab 07/24/17 2139 07/25/17 0431 07/26/17 0514  NA 138 139 140  K 4.3 4.3 4.8  CL 106 109 111  CO2 22 20* 22  GLUCOSE 134* 131* 162*  BUN 25* 26* 27*  CREATININE 1.63* 1.64* 1.70*  CALCIUM 8.7* 8.0* 8.0*  MG  --   --  1.6*  AST 16  --   --   ALT 10*  --   --   ALKPHOS 79  --   --   BILITOT 0.3  --   --    ------------------------------------------------------------------------------------------------------------------ No results for input(s): CHOL, HDL, LDLCALC, TRIG, CHOLHDL, LDLDIRECT in the last 72 hours.  Lab  Results  Component Value Date   HGBA1C 7.2 06/01/2017   ------------------------------------------------------------------------------------------------------------------ No results for input(s): TSH, T4TOTAL, T3FREE, THYROIDAB in the last 72 hours.  Invalid input(s): FREET3 ------------------------------------------------------------------------------------------------------------------ No results for input(s): VITAMINB12, FOLATE, FERRITIN, TIBC, IRON, RETICCTPCT in the last 72 hours.  Coagulation profile No results for input(s): INR, PROTIME in the last 168 hours.  No results for input(s): DDIMER in the last 72 hours.  Cardiac Enzymes No results for input(s): CKMB, TROPONINI, MYOGLOBIN in the last 168 hours.  Invalid input(s): CK ------------------------------------------------------------------------------------------------------------------ No results found for: BNP   Shon Hale M.D on 07/27/2017 at 5:32 PM  Between 7am to 7pm - Pager - [phone]  After 7pm go to [email or web address] - password [email or web address]  [facility] -  Office  [phone]   Voice Recognition Reubin Milan dictation system was used to create this note, attempts have been made to correct errors. Please contact the author with questions and/or clarifications.

## 2017-07-28 LAB — GLUCOSE, CAPILLARY: Glucose-Capillary: 118 mg/dL — ABNORMAL HIGH (ref 65–99)

## 2017-07-28 MED ORDER — ACETAMINOPHEN 325 MG PO TABS: 650.0000 mg | ORAL_TABLET | Freq: Four times a day (QID) | ORAL | Status: AC | PRN

## 2017-07-28 MED ORDER — LACTINEX PO CHEW: 1.0000 | CHEWABLE_TABLET | Freq: Three times a day (TID) | ORAL | 0 refills | Status: AC

## 2017-07-28 MED ORDER — ONDANSETRON HCL 4 MG PO TABS: 4.0000 mg | ORAL_TABLET | Freq: Four times a day (QID) | ORAL | 0 refills | Status: DC | PRN

## 2017-07-28 MED ORDER — HYDROCODONE-ACETAMINOPHEN 5-325 MG PO TABS: 1.0000 | ORAL_TABLET | ORAL | 0 refills | Status: DC | PRN

## 2017-07-28 MED ORDER — CLINDAMYCIN HCL 300 MG PO CAPS: 300.0000 mg | ORAL_CAPSULE | Freq: Three times a day (TID) | ORAL | 0 refills | Status: DC

## 2017-07-28 NOTE — Discharge Summary (Signed)
Diane [name], is a 67 y.o. female  DOB [birth year]  MRN [id number].  Admission date:  07/24/2017  Admitting Physician  Merton Border, MD  Discharge Date:  07/28/2017   Primary MD  Ladell Pier, MD  Recommendations for primary care physician for things to follow:   1)Take antibiotics as prescribed 2)Take Lactobacillus prescribed and eat yogurt to limit your risk for antibiotic associated diarrhea and bowel infection 3)Follow-up with Dr. Sharol Given your Orthopedic surgeon within a week for recheck of your right foot 4)Recheck your Kidney Test/Basic Metabolic Profile/BMP and CBC with your primary care doctor within the next 7 to  10 days  Admission Diagnosis  Right foot swelling, fever   Discharge Diagnosis  Right foot swelling, fever    Active Problems:   Cellulitis      Past Medical History:  Diagnosis Date  . Anxiety   . Arthritis    "everywhere" (04/26/2016)  . Childhood asthma   . Chronic lower back pain   . Claustrophobia   . Complication of anesthesia    "had metallic taste in my mouth for a year after one of my ORs; next OR they changed some stuff; no problems since" (04/26/2016)  . Depression   . Diabetic polyneuropathy ([place])   . Family history of adverse reaction to anesthesia    "mom; don't have an idea what it was" (04/26/2016)  . Fibromyalgia   . GERD (gastroesophageal reflux disease)   . Headache    "related to stress" (04/26/2016)  . Hypertension   . Manic, depressive ([place])    "severe" (04/26/2016)  . Pneumonia    "long time ago" (04/26/2016)  . Sickle cell trait ([place])   . Sleep apnea    "mask ordered; too claustrophobic to wear" (04/26/2016)  . Type II diabetes mellitus ([place])     Past Surgical History:  Procedure Laterality Date  . ABDOMINAL HYSTERECTOMY     "partial"  . APPENDECTOMY    . CALCANEAL OSTEOTOMY Left 04/28/2016   Procedure: CALCANEAL OSTEOTOMY;   Surgeon: Newt Minion, MD;  Location: [facility];  Service: Orthopedics;  Laterality: Left;  . CATARACT EXTRACTION W/ INTRAOCULAR LENS  IMPLANT, BILATERAL Bilateral   . FOOT SURGERY Left 2015  . FOOT SURGERY Right 04/2014   subtalar joint arthrodesis, talonavicualr joint arthrodesis, hammertoe repair 2,4,5 right foot  . FRACTURE SURGERY    . LAPAROSCOPIC CHOLECYSTECTOMY    . OPEN REDUCTION INTERNAL FIXATION (ORIF) TIBIA/FIBULA FRACTURE Left   . TOE AMPUTATION Bilateral    toe hallux   . TONSILLECTOMY AND ADENOIDECTOMY         HPI  from the history and physical done on the day of admission:     Diane [name]  is a 67 y.o. female, group home resident, with past medical history significant for diabetes , peripheral neuropathy, history of fibromyalgia, hypertension and hyperlipidemia presenting with right foot swelling and redness that was noticed only 1 week ago.  Patient reports fever and chills earlier today but not at this time.  Patient was unsure whether it was a bug bite or an infection so she did not seek help until today.  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  While in the hospital on 07/27/2017  And on 07/28/2017 patient refused further IV antibiotics.  Arrangements were made for patient to go to skilled nursing facility for rehab prior to returning home, however on 07/28/2017 prior to discharge patient refused to go to skilled nursing facility, she wants to go home with home health   Assessment & Plan:  Active Problems: Cellulitis   #) Right  lower extremity cellulitis: Improving swelling, redness and pain, initially treated with Vanco and cefepime started on 07/25/2017 , blood cultures remain negative  -MRIdone on 07/25/2017 shows only evidence of cellulitis and small subcentimeter abscesses, leukocytosis is resolved.  While in the hospital on 07/27/2017  And on 07/28/2017 patient refused further IV antibiotics.  When it came to making a decision regarding oral antibiotic upon discharge, case was discussed with pharmacist, patient has CKD and is on losartan so we will avoid Bactrim (risk of worsening CKD and hyperkalemia with this combo), patient has allergies to penicillin and tetracycline, on further questioning patient states she would not take/try doxycycline due to prior reaction to tetracycline (mouth sores), given these limitations in order to have some degree of MRSA coverage clindamycin was chosen for outpatient treatment 300 mg 3 times daily for 1 week with lactobacillus.  Risk of C. difficile infection and antibiotic associated diarrhea discussed with patient  #) type 2 diabetes:  -Resume home regimen   #) Hypertension/hyperlipidemia: Stable, -Continue losartan 50 mg daily, -Continue pravastatin 20 mg daily  #Psych/MOOD/pain:-Continue home pain medication including gabapentin 600 mg 3 times a day for diabetic neuropathy,-Continue lamotrigine 100 mg daily, -Continue donepezil 5 mg nightly, -Continue clonazepam 0.5 mg as needed, -Continue chlorpromazine 10 mg nightly, patient is advised to follow-up with her psychiatrist  #Anemia- stable, patient has history of chronic anemia (suspect some degree of CKD related anemia)  CKDIII-at baseline patient has CKD stage III, creatinine currently around 1.7, avoid nephrotoxic agents , repeat BMP with PCP within 1-2 weeks  Disposition: Patient adamantly refuses to go to  skilled nursing facility for rehab, we discharged home with home health PT and social work  Full code  Discharge  Condition: stable  Follow UP  Contact information for after-discharge care    Destination    [facility] .   Service:  Skilled Nursing Contact information: [address] [phone]              Diet and Activity recommendation:  As advised  Discharge Instructions     Discharge Instructions    Call MD for:  difficulty breathing, headache or visual disturbances   Complete by:  As directed    Call MD for:  persistant dizziness or light-headedness   Complete by:  As directed    Call MD for:  persistant nausea and vomiting   Complete by:  As directed    Call MD for:  redness, tenderness, or signs of infection (pain, swelling, redness,  odor or green/yellow discharge around incision site)   Complete by:  As directed    Call MD for:  severe uncontrolled pain   Complete by:  As directed    Call MD for:  temperature >100.4   Complete by:  As directed    Diet - low sodium heart healthy   Complete by:  As directed    Discharge instructions   Complete by:  As directed    1)Take antibiotics as prescribed 2)Take Lactobacillus prescribed and eat yogurt to limit your risk for antibiotic associated diarrhea and bowel infection 3)Follow-up with Dr. Sharol Given your Orthopedic surgeon within a week for recheck of your right foot 4)Recheck your Kidney Test/Basic Metabolic Profile/BMP and CBC with your primary care doctor within the next 7 to  10 days   Increase activity slowly   Complete by:  As directed         Discharge Medications     Allergies as of 07/28/2017      Reactions   Erythromycin Anaphylaxis   Biaxin [clarithromycin] Other (See Comments)   Feldene [piroxicam] Diarrhea, Nausea And Vomiting, Other (See Comments)   And sores in mouth   Nsaids Other (See Comments)   Contraindicated because of her other meds   Tetracyclines & Related    Latex Rash   Penicillins Rash, Other (See Comments)   Has patient had a  PCN reaction causing immediate rash, facial/tongue/throat swelling, SOB or lightheadedness with hypotension: Yes Has patient had a PCN reaction causing severe rash involving mucus membranes or skin necrosis: No Has patient had a PCN reaction that required hospitalization No Has patient had a PCN reaction occurring within the last 10 years: No If all of the above answers are "NO", then may proceed with Cephalosporin use. And sores in her mouth   Tape Rash      Medication List    TAKE these medications   ACCU-CHEK AVIVA PLUS w/Device Kit Use as directed for 3 times daily testing of blood glucose. E11.9   acetaminophen 325 MG tablet Commonly known as:  TYLENOL Take 2 tablets (650 mg total) by mouth every 6 (six) hours as needed for mild pain (or Fever >/= 101).   cetirizine 10 MG tablet Commonly known as:  ZYRTEC Take 1 tablet (10 mg total) by mouth daily.   chlorproMAZINE 10 MG tablet Commonly known as:  THORAZINE Take 1 tablet (10 mg total) by mouth at bedtime.   clindamycin 300 MG capsule Commonly known as:  CLEOCIN Take 1 capsule (300 mg total) by mouth 3 (three) times daily.   clonazePAM 0.5 MG tablet Commonly known as:  KLONOPIN Take 1 tablet (0.5 mg total) by mouth daily as needed for anxiety.   diphenhydrAMINE 25 MG tablet Commonly known as:  BENADRYL Take 25 mg by mouth every 6 (six) hours as needed for itching.   donepezil 5 MG tablet Commonly known as:  ARICEPT Take 1 tablet (5 mg total) by mouth at bedtime.   ergocalciferol 50000 units capsule Commonly known as:  VITAMIN D2 Take 1 capsule (50,000 Units total) by mouth once a week.   famotidine 20 MG tablet Commonly known as:  PEPCID Take 1 tablet (20 mg total) by mouth 2 (two) times daily.   furosemide 20 MG tablet Commonly known as:  LASIX Take 1 tablet (20 mg total) by mouth daily.   gabapentin 300 MG capsule Commonly known as:  NEURONTIN Take 2 capsules (600 mg total) by mouth 3 (three) times  daily.  What changed:  Another medication with the same name was removed. Continue taking this medication, and follow the directions you see here.   glucose blood test strip Commonly known as:  ACCU-CHEK AVIVA PLUS Use as instructed for 3 times daily testing of blood glucose. E.11.9   HYDROcodone-acetaminophen 5-325 MG tablet Commonly known as:  NORCO/VICODIN Take 1 tablet by mouth every 4 (four) hours as needed for moderate pain.   insulin aspart 100 UNIT/ML FlexPen Commonly known as:  NOVOLOG Inject 1 Units into the skin 3 (three) times daily with meals. Sliding scale; 12 units max per day   Insulin Pen Needle 32G X 4 MM Misc Commonly known as:  ULTICARE MICRO PEN NEEDLES 1 applicator by Does not apply route at bedtime.   lactobacillus acidophilus & bulgar chewable tablet Chew 1 tablet by mouth 3 (three) times daily with meals.   lamoTRIgine 100 MG tablet Commonly known as:  LAMICTAL Take 1 tablet (100 mg total) by mouth daily.   losartan 50 MG tablet Commonly known as:  COZAAR Take 1 tablet (50 mg total) by mouth daily.   mupirocin ointment 2 % Commonly known as:  BACTROBAN Apply to any area that appears infected 2X daily   ondansetron 4 MG tablet Commonly known as:  ZOFRAN Take 1 tablet (4 mg total) by mouth every 6 (six) hours as needed for nausea.   oxybutynin 10 MG 24 hr tablet Commonly known as:  DITROPAN-XL Take 1 tablet (10 mg total) by mouth at bedtime.   pravastatin 20 MG tablet Commonly known as:  PRAVACHOL Take 1 tablet (20 mg total) by mouth daily.   triamcinolone cream 0.1 % Commonly known as:  KENALOG Apply 1 application topically 2 (two) times daily.      Allergies as of 07/28/2017      Reactions   Erythromycin Anaphylaxis   Biaxin [clarithromycin] Other (See Comments)   Feldene [piroxicam] Diarrhea, Nausea And Vomiting, Other (See Comments)   And sores in mouth   Nsaids Other (See Comments)   Contraindicated because of her other meds    Tetracyclines & Related    Latex Rash   Penicillins Rash, Other (See Comments)   Has patient had a PCN reaction causing immediate rash, facial/tongue/throat swelling, SOB or lightheadedness with hypotension: Yes Has patient had a PCN reaction causing severe rash involving mucus membranes or skin necrosis: No Has patient had a PCN reaction that required hospitalization No Has patient had a PCN reaction occurring within the last 10 years: No If all of the above answers are "NO", then may proceed with Cephalosporin use. And sores in her mouth   Tape Rash      Medication List    TAKE these medications   ACCU-CHEK AVIVA PLUS w/Device Kit Use as directed for 3 times daily testing of blood glucose. E11.9   acetaminophen 325 MG tablet Commonly known as:  TYLENOL Take 2 tablets (650 mg total) by mouth every 6 (six) hours as needed for mild pain (or Fever >/= 101).   cetirizine 10 MG tablet Commonly known as:  ZYRTEC Take 1 tablet (10 mg total) by mouth daily.   chlorproMAZINE 10 MG tablet Commonly known as:  THORAZINE Take 1 tablet (10 mg total) by mouth at bedtime.   clindamycin 300 MG capsule Commonly known as:  CLEOCIN Take 1 capsule (300 mg total) by mouth 3 (three) times daily.   clonazePAM 0.5 MG tablet Commonly known as:  KLONOPIN Take 1 tablet (0.5 mg total) by  mouth daily as needed for anxiety.   diphenhydrAMINE 25 MG tablet Commonly known as:  BENADRYL Take 25 mg by mouth every 6 (six) hours as needed for itching.   donepezil 5 MG tablet Commonly known as:  ARICEPT Take 1 tablet (5 mg total) by mouth at bedtime.   ergocalciferol 50000 units capsule Commonly known as:  VITAMIN D2 Take 1 capsule (50,000 Units total) by mouth once a week.   famotidine 20 MG tablet Commonly known as:  PEPCID Take 1 tablet (20 mg total) by mouth 2 (two) times daily.   furosemide 20 MG tablet Commonly known as:  LASIX Take 1 tablet (20 mg total) by mouth daily.   gabapentin 300 MG  capsule Commonly known as:  NEURONTIN Take 2 capsules (600 mg total) by mouth 3 (three) times daily. What changed:  Another medication with the same name was removed. Continue taking this medication, and follow the directions you see here.   glucose blood test strip Commonly known as:  ACCU-CHEK AVIVA PLUS Use as instructed for 3 times daily testing of blood glucose. E.11.9   HYDROcodone-acetaminophen 5-325 MG tablet Commonly known as:  NORCO/VICODIN Take 1 tablet by mouth every 4 (four) hours as needed for moderate pain.   insulin aspart 100 UNIT/ML FlexPen Commonly known as:  NOVOLOG Inject 1 Units into the skin 3 (three) times daily with meals. Sliding scale; 12 units max per day   Insulin Pen Needle 32G X 4 MM Misc Commonly known as:  ULTICARE MICRO PEN NEEDLES 1 applicator by Does not apply route at bedtime.   lactobacillus acidophilus & bulgar chewable tablet Chew 1 tablet by mouth 3 (three) times daily with meals.   lamoTRIgine 100 MG tablet Commonly known as:  LAMICTAL Take 1 tablet (100 mg total) by mouth daily.   losartan 50 MG tablet Commonly known as:  COZAAR Take 1 tablet (50 mg total) by mouth daily.   mupirocin ointment 2 % Commonly known as:  BACTROBAN Apply to any area that appears infected 2X daily   ondansetron 4 MG tablet Commonly known as:  ZOFRAN Take 1 tablet (4 mg total) by mouth every 6 (six) hours as needed for nausea.   oxybutynin 10 MG 24 hr tablet Commonly known as:  DITROPAN-XL Take 1 tablet (10 mg total) by mouth at bedtime.   pravastatin 20 MG tablet Commonly known as:  PRAVACHOL Take 1 tablet (20 mg total) by mouth daily.   triamcinolone cream 0.1 % Commonly known as:  KENALOG Apply 1 application topically 2 (two) times daily.        Major procedures and Radiology Reports - PLEASE review detailed and final reports for all details, in brief -    Mr Foot Right W Wo Contrast  Result Date: 07/25/2017 CLINICAL DATA:  Forefoot  soft tissue swelling. Swelling of the second toe with toe turning blue per patient. EXAM: MRI OF THE RIGHT FOREFOOT WITHOUT AND WITH CONTRAST TECHNIQUE: Multiplanar, multisequence MR imaging of the right forefoot was performed before and after the administration of intravenous contrast. CONTRAST:  56m MULTIHANCE GADOBENATE DIMEGLUMINE 529 MG/ML IV SOLN COMPARISON:  Same day radiographs FINDINGS: Bones/Joint/Cartilage Previous great toe amputation at the level of the MTP. Redemonstration of subchondral degenerative cystic change of the midfoot articulations without acute osseous appearing abnormality nor frank bone destruction. Degenerative joint space narrowing and subchondral reactive edema is noted about the calcaneocuboid, naviculocuneiform, intercuneiform and base of the TMT joints. Osteoarthritic joint space narrowing of the DIP and  PIP joints of the second through fifth digits. No conclusive evidence for acute osteomyelitis. Ligaments Noncontributory Muscles and Tendons Myositis of the plantar flexor muscles of the forefoot. No evidence of a pyomyositis. Soft tissues Two small dorsal soft tissue abscesses measuring 9 x 5 x 5 mm and 15 x 5 x 10 mm are identified along the lateral as well as dorsal aspect of the proximal second proximal phalanx. Moderate degree of dorsal soft tissue edema consistent with cellulitis is identified of the forefoot. IMPRESSION: 1. Two small enhancing soft tissue abscesses overlying the dorsum aspect of the left second distal phalanx and within the webspace between the second and third toes measuring 9 x 5 x 5 mm in the webspace and 15 x 5 x 10 mm over the dorsum of the toe without underlying marrow signal abnormalities to suggest acute osteomyelitis. 2. Moderate degree of soft tissue edema and swelling compatible cellulitis. 3. Redemonstration of great toe amputation with overlying cellulitis. 4. Redemonstration of mild to moderate midfoot osteoarthritic joint space narrowing and  reactive subchondral marrow edema and cystic change. 5. Mild osteoarthritic joint space narrowing of the DIP and PIP joints of the remaining second through fifth digits. Electronically Signed   By: Ashley Royalty M.D.   On: 07/25/2017 21:49   Dg Foot Complete Right  Result Date: 07/24/2017 CLINICAL DATA:  Right foot cellulitis after insect bite.  Diabetes. 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 Soft tissue swelling with skin thickening is seen about the foot more so dorsally since prior study. Healed plantar ulceration since prior. Stable appearance of the osseous elements of the foot and ankle without evidence of osteomyelitis. Calcaneal enthesopathy is noted along the plantar dorsal aspect. IMPRESSION: Soft tissue swelling with skin thickening suggestive of cellulitis. No evidence of osteomyelitis nor significant osseous change. Electronically Signed   By: Ashley Royalty M.D.   On: 07/24/2017 22:24    Micro Results    Recent Results (from the past 240 hour(s))  Culture, blood (single)     Status: None (Preliminary result)   Collection Time: 07/24/17 11:03 PM  Result Value Ref Range Status   Specimen Description   Final    BLOOD LEFT ANTECUBITAL Performed at [facility] [address] [place], [place] [address]    Special Requests   Final    BOTTLES DRAWN AEROBIC AND ANAEROBIC Blood Culture adequate volume Performed at [facility] [address] [place], [place] [address]    Culture   Final    NO GROWTH 2 DAYS Performed at [facility] [address] [place], [place] [address]    Report Status PENDING  Incomplete     Today   Subjective    Labrisha Wuellner today has no new complaints, patient is refusing IV antibiotics, she is refusing to go to skilled nursing facility for rehab, she just wants to go home          Patient  has been seen and examined prior to discharge   Objective   Blood pressure (!) 163/81, pulse 90, temperature 98.5 F (36.9 C), temperature source Oral, resp. rate 18, height 5' 1"  (1.549 m), weight 94.3 kg (208 lb), SpO2 93 %.   Intake/Output Summary (Last 24 hours) at 07/28/2017 1058 Last data filed at 07/28/2017 0948 Gross per 24 hour  Intake 1290 ml  Output -  Net 1290 ml  Exam  Gen:- Awake Alert,  In no apparent distress  HEENT:- [place].AT, No sclera icterus Neck-Supple Neck,No JVD,.  Lungs-  CTAB , fair air movement CV- S1, S2 normal Abd-  +ve B.Sounds, Abd Soft, No tenderness,    Extremity/Skin:-Right foot with status post prior right big toe amputation, improved warmth, tenderness, swelling appears to be improved, scars from previous surgical procedures over the right foot  Psych-affect is appropriate, oriented x3 Neuro-no new focal deficits, no tremors   Data Review   CBC w Diff:  Lab Results  Component Value Date   WBC 9.1 07/26/2017   HGB 8.3 (L) 07/26/2017   HCT 27.0 (L) 07/26/2017   PLT 279 07/26/2017   LYMPHOPCT 16 07/24/2017   MONOPCT 6 07/24/2017   EOSPCT 3 07/24/2017   BASOPCT 0 07/24/2017    CMP:  Lab Results  Component Value Date   NA 140 07/26/2017   K 4.8 07/26/2017   CL 111 07/26/2017   CO2 22 07/26/2017   BUN 27 (H) 07/26/2017   CREATININE 1.70 (H) 07/26/2017   CREATININE 1.57 (H) 07/12/2016   PROT 7.9 07/24/2017   ALBUMIN 3.2 (L) 07/24/2017   BILITOT 0.3 07/24/2017   ALKPHOS 79 07/24/2017   AST 16 07/24/2017   ALT 10 (L) 07/24/2017    Total Discharge time is about 33 minutes  Roxan Hockey M.D on 07/28/2017 at 10:58 AM  [facility]   Office  [phone]  Voice Recognition Viviann Spare dictation system was used to create this note, attempts have been made to correct errors. Please contact the author with questions and/or clarifications.

## 2017-07-28 NOTE — Discharge Instructions (Signed)
1)Take antibiotics as prescribed 2)Take Lactobacillus prescribed and eat yogurt to limit your risk for antibiotic associated diarrhea and bowel infection 3)Follow-up with Dr. Lajoyce Cornersuda your Orthopedic surgeon within a week for recheck of your right foot 4)Recheck your Kidney Test/Basic Metabolic Profile/BMP and CBC with your primary care doctor within the next 7 to  10 days

## 2017-07-30 LAB — CULTURE, BLOOD (SINGLE)
Culture: NO GROWTH
SPECIAL REQUESTS: ADEQUATE

## 2017-08-02 ENCOUNTER — Ambulatory Visit (INDEPENDENT_AMBULATORY_CARE_PROVIDER_SITE_OTHER): Payer: Medicare PPO | Admitting: Licensed Clinical Social Worker

## 2017-08-02 ENCOUNTER — Telehealth (HOSPITAL_COMMUNITY): Payer: Self-pay

## 2017-08-02 ENCOUNTER — Encounter (HOSPITAL_COMMUNITY): Payer: Self-pay | Admitting: Licensed Clinical Social Worker

## 2017-08-02 DIAGNOSIS — F315 Bipolar disorder, current episode depressed, severe, with psychotic features: Secondary | ICD-10-CM | POA: Diagnosis not present

## 2017-08-02 NOTE — Telephone Encounter (Signed)
Patient was here reviewing her medication and said she has a new pharmacy, she wanted us to send the Gabapentin to the new pharmacy because [facility] said they could not transfer her refills. I told her I would reach out to you as we are not the prescribers of the Gabapentin.[facility] - this has been updated I the chart.

## 2017-08-02 NOTE — Telephone Encounter (Signed)
Patient came in to see Brayton CavesJessie and stopped by my office and wanted you to know that the Thorazine is not helping her to sleep. She is taking it and says she is still awake 4 hours later. Please review and advise, thank you

## 2017-08-02 NOTE — Progress Notes (Signed)
   THERAPIST PROGRESS NOTE  Session Time: 1:30pm-2:30pm  Participation Level: Active  Behavioral Response: CasualAlertDepressed  Type of Therapy: Individual Therapy  Treatment Goals addressed: Improve psychiatric symptoms, reduce irrational fears (accurately interpret ordinary events and situation), improve unhelpful thought patterns, emotional regulation (stress tolerance, stress reduction), learn about diagnosis, healthy coping skills   Interventions: CBT, Motivational Interviewing, grounding and mindfulness techniques, psychoeducation  Summary: Diane [name] is a 67 y.o. female who presents with Bipolar I disorder, most recent episode depressed, severe, with psychotic features.   Suicidal/Homicidal: thoughts of death - without intent/plan  Therapist Response: Diane [name] met with clinician for an individual session. Diane [name] discussed her psychiatric symptoms, her current life events and her homework. Diane [name] reports increased depression and thoughts of death over the past few weeks. She identified no intent, as she is "allergic to pain". However, she reports she feels she has nothing to live for and she knows she will die soon. Diane [name] reported recent hospitalization due to cellulitis. Clinician explored experiences in hospital and plan to manage health. Clinician discussed housing concerns and provided information for [employer]. Clinician identified the importance of having stable housing in order to ensure comfort, safety, and to be able to manage emotional health.   Plan: Return again in 1 week.  Diagnosis:     Axis I: Bipolar I disorder, most recent episode depressed, severe, with psychotic features.    Jobe Marker [place], LCSW 08/02/2017

## 2017-08-03 ENCOUNTER — Other Ambulatory Visit (HOSPITAL_COMMUNITY): Payer: Self-pay | Admitting: Psychiatry

## 2017-08-03 ENCOUNTER — Other Ambulatory Visit (INDEPENDENT_AMBULATORY_CARE_PROVIDER_SITE_OTHER): Payer: Self-pay

## 2017-08-03 MED ORDER — GABAPENTIN 300 MG PO CAPS: 600.0000 mg | ORAL_CAPSULE | Freq: Three times a day (TID) | ORAL | 3 refills | Status: DC

## 2017-08-03 NOTE — Telephone Encounter (Signed)
Ok per Dr. Lajoyce Cornersuda to send the rx to the new pharmacy pt request. This has been done.

## 2017-08-03 NOTE — Telephone Encounter (Signed)
She can take Thorazine 20 mg at bedtime.

## 2017-08-04 MED ORDER — CHLORPROMAZINE HCL 25 MG PO TABS: 25.0000 mg | ORAL_TABLET | Freq: Every day | ORAL | 1 refills | Status: DC

## 2017-08-04 NOTE — Telephone Encounter (Signed)
Order sent and the patient is in agreement

## 2017-08-07 ENCOUNTER — Ambulatory Visit (INDEPENDENT_AMBULATORY_CARE_PROVIDER_SITE_OTHER): Payer: Medicare PPO | Admitting: Orthopedic Surgery

## 2017-08-07 ENCOUNTER — Encounter (INDEPENDENT_AMBULATORY_CARE_PROVIDER_SITE_OTHER): Payer: Self-pay | Admitting: Orthopedic Surgery

## 2017-08-07 ENCOUNTER — Telehealth (INDEPENDENT_AMBULATORY_CARE_PROVIDER_SITE_OTHER): Payer: Self-pay | Admitting: Orthopedic Surgery

## 2017-08-07 VITALS — Ht 61.0 in | Wt 208.0 lb

## 2017-08-07 DIAGNOSIS — E11610 Type 2 diabetes mellitus with diabetic neuropathic arthropathy: Secondary | ICD-10-CM | POA: Diagnosis not present

## 2017-08-07 DIAGNOSIS — L97511 Non-pressure chronic ulcer of other part of right foot limited to breakdown of skin: Secondary | ICD-10-CM

## 2017-08-07 MED ORDER — GABAPENTIN 300 MG PO CAPS: 600.0000 mg | ORAL_CAPSULE | Freq: Three times a day (TID) | ORAL | 3 refills | Status: AC

## 2017-08-07 NOTE — Telephone Encounter (Signed)
Fax order for the shoes as well as a strap for Velcro that will hold stilt togther.  [facility]. Please fax order  [phone]

## 2017-08-07 NOTE — Progress Notes (Signed)
Office Visit Note   Patient: Diane [name]           Date of Birth: 1950/05/11           MRN: [id number] Visit Date: 08/07/2017              Requested by: Marcine Matar, MD [address] [place], [place] [address] PCP: Marcine Matar, MD  Chief Complaint  Patient presents with  . Right Foot - Follow-up      HPI: Patient is a 67 year old woman who presents with multiple medical problems.  She states she is unsteady with her walker she states she has had 3 falls due to the instability from her rolling walker.  Patient states that she is currently on Neurontin 400 mg 3 times a day and would like this refilled.  Patient does have extra-depth shoes and double upright braces from [employer].  Patient complains of worsening ulcer on the plantar aspect of the right foot.  Patient states that due to her difficulty with ambulation she would benefit from [employer] bus transportation.  Assessment & Plan: Visit Diagnoses:  1. Charcot foot due to diabetes mellitus (HCC)   2. Right foot ulcer, limited to breakdown of skin (HCC)     Plan: Patient was given a prescription for the Neurontin 600 mg 3 times a day she is given a prescription for a new rolling walker as well as a prescription for transportation with greens.  [employer].  Follow-Up Instructions: Return in about 1 month (around 09/04/2017).   Ortho Exam  Patient is alert, oriented, no adenopathy, well-dressed, normal affect, normal respiratory effort.  Patient has an antalgic gait she uses a rolling walker.  She shakes the walker and states that it is unstable.  Her right foot has a Waggoner grade 1 ulcer x2.  She has a stable Charcot collapse her foot is plantigrade.  After informed consent a 10 blade knife was used to debride the skin and soft tissue back to healthy viable granulation tissue this was touched with silver nitrate.  The forefoot ulcer is 3 cm in diameter and 5 mm deep the heel ulcer is  10 mm in diameter and 0.1 mm deep.  Patient has no abscess   Imaging: No results found. No images are attached to the encounter.  Labs: Lab Results  Component Value Date   HGBA1C 7.2 06/01/2017   HGBA1C 6.4 07/12/2016   HGBA1C 7.6 (H) 04/26/2016   ESRSEDRATE 51 (H) 05/31/2016   ESRSEDRATE 116 (H) 04/26/2016   REPTSTATUS 07/30/2017 FINAL 07/24/2017   CULT  07/24/2017    NO GROWTH 5 DAYS Performed at [facility], [address], [place], [place] [address]     [email or web address] [id number]  Body mass index is 39.3 kg/m.  Orders:  No orders of the defined types were placed in this encounter.  No orders of the defined types were placed in this encounter.    Procedures: No procedures performed  Clinical Data: No additional findings.  ROS:  All other systems negative, except as noted in the HPI. Review of Systems  Objective: Vital Signs: Ht 5\' 1"  (1.549 m)   Wt 208 lb (94.3 kg)   BMI 39.30 kg/m   Specialty Comments:  No specialty comments available.  PMFS History: Patient Active Problem List   Diagnosis Date Noted  . Cellulitis 07/24/2017  . Pain in left leg 04/20/2017  . Charcot foot due to diabetes mellitus (HCC) 02/21/2017  .  Unilateral primary osteoarthritis, right knee 09/07/2016  . Pain in right hip 09/07/2016  . Vitamin D deficiency 07/25/2016  . Diabetic ulcer of right heel associated with type 2 diabetes mellitus, limited to breakdown of skin (HCC) 05/12/2016  . Osteomyelitis of ankle or foot   . 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 Right foot ulcer, limited to breakdown of skin (HCC) 04/26/2016  . Hypertension 04/26/2016  . DM (diabetes mellitus) type II controlled with renal manifestation (HCC) 04/26/2016  . Fibromyalgia 04/26/2016  . Diabetic polyneuropathy (HCC) 04/26/2016   Past Medical History:  Diagnosis Date  . Anxiety   . Arthritis     "everywhere" (04/26/2016)  . Childhood asthma   . Chronic lower back pain   . Claustrophobia   . Complication of anesthesia    "had metallic taste in my mouth for a year after one of my ORs; next OR they changed some stuff; no problems since" (04/26/2016)  . Depression   . Diabetic polyneuropathy (HCC)   . Family history of adverse reaction to anesthesia    "mom; don't have an idea what it was" (04/26/2016)  . Fibromyalgia   . GERD (gastroesophageal reflux disease)   . Headache    "related to stress" (04/26/2016)  . Hypertension   . Manic, depressive (HCC)    "severe" (04/26/2016)  . Pneumonia    "long time ago" (04/26/2016)  . Sickle cell trait (HCC)   . Sleep apnea    "mask ordered; too claustrophobic to wear" (04/26/2016)  . Type II diabetes mellitus (HCC)     History reviewed. No pertinent family history.  Past Surgical History:  Procedure Laterality Date  . ABDOMINAL HYSTERECTOMY     "partial"  . APPENDECTOMY    . CALCANEAL OSTEOTOMY Left 04/28/2016   Procedure: CALCANEAL OSTEOTOMY;  Surgeon: Nadara MustardMarcus V Duda, MD;  [facility];  Service: Orthopedics;  Laterality: Left;  . CATARACT EXTRACTION W/ INTRAOCULAR LENS  IMPLANT, BILATERAL Bilateral   . FOOT SURGERY Left 2015  . FOOT SURGERY Right 04/2014   subtalar joint arthrodesis, talonavicualr joint arthrodesis, hammertoe repair 2,4,5 right foot  . FRACTURE SURGERY    . LAPAROSCOPIC CHOLECYSTECTOMY    . OPEN REDUCTION INTERNAL FIXATION (ORIF) TIBIA/FIBULA FRACTURE Left   . TOE AMPUTATION Bilateral    toe hallux   . TONSILLECTOMY AND ADENOIDECTOMY     Social History   Occupational History  . Not on file  Tobacco Use  . Smoking status: Never Smoker  . Smokeless tobacco: Never Used  Substance and Sexual Activity  . Alcohol use: No  . Drug use: No  . Sexual activity: Never

## 2017-08-07 NOTE — Telephone Encounter (Signed)
Pt has an appt in the office this afternoon will have Dr. Lajoyce Cornersuda to address during her appt and fax at that time.

## 2017-08-09 NOTE — Telephone Encounter (Signed)
Order written and faxed.

## 2017-08-10 ENCOUNTER — Encounter (HOSPITAL_COMMUNITY): Payer: Self-pay | Admitting: Licensed Clinical Social Worker

## 2017-08-10 ENCOUNTER — Ambulatory Visit (INDEPENDENT_AMBULATORY_CARE_PROVIDER_SITE_OTHER): Payer: Medicare PPO | Admitting: Licensed Clinical Social Worker

## 2017-08-10 DIAGNOSIS — F315 Bipolar disorder, current episode depressed, severe, with psychotic features: Secondary | ICD-10-CM

## 2017-08-10 DIAGNOSIS — F411 Generalized anxiety disorder: Secondary | ICD-10-CM | POA: Diagnosis not present

## 2017-08-10 DIAGNOSIS — F331 Major depressive disorder, recurrent, moderate: Secondary | ICD-10-CM | POA: Diagnosis not present

## 2017-08-10 NOTE — Progress Notes (Signed)
   THERAPIST PROGRESS NOTE  Session Time: 2:30pm-3:30pm  Participation Level: Active  Behavioral Response: Well GroomedAlertDepressed  Type of Therapy: Individual Therapy  Treatment Goals addressed: Improve psychiatric symptoms, reduce irrational fears (accurately interpret ordinary events and situation), improve unhelpful thought patterns, emotional regulation (stress tolerance, stress reduction), learn about diagnosis, healthy coping skills   Interventions: CBT, Motivational Interviewing, grounding and mindfulness techniques, psychoeducation  Summary: Diane [name] is a 67 y.o. female who presents with Bipolar I disorder, most recent episode depressed, severe, with psychotic features.   Suicidal/Homicidal: No - without intent/plan  Therapist Response: Diane [name] met with clinician for an individual session. Diane [name] discussed her psychiatric symptoms, her current life events and her homework. Diane [name] reports she has been very depressed due to the loss of her dogs. Diane [name] discussed the importance of her dogs in her life and shared her experiences raising them and birthing their puppies. Clinician utilized CBT to identify thoughts, feelings, and behaviors, especially the change in mood when talking about her dogs. Clinician explored options for moving to a place where she could have a dog. Clinician discussed the options of participating in [place] or IOP groups. Diane [name] agreed to meet with Diane [name] to give some information about the groups and for her to consider this.    Plan: Return again in 1 weeks.  Diagnosis:     Axis I: Bipolar I disorder, most recent episode depressed, severe, with psychotic features.   Jobe Marker [place], LCSW 08/10/2017

## 2017-08-17 ENCOUNTER — Ambulatory Visit (INDEPENDENT_AMBULATORY_CARE_PROVIDER_SITE_OTHER): Payer: Medicare PPO | Admitting: Licensed Clinical Social Worker

## 2017-08-17 ENCOUNTER — Encounter (HOSPITAL_COMMUNITY): Payer: Self-pay | Admitting: Licensed Clinical Social Worker

## 2017-08-17 DIAGNOSIS — F315 Bipolar disorder, current episode depressed, severe, with psychotic features: Secondary | ICD-10-CM | POA: Diagnosis not present

## 2017-08-17 NOTE — Progress Notes (Signed)
   THERAPIST PROGRESS NOTE  Session Time: 2:30pm-3:30pm  Participation Level: Active  Behavioral Response: Well GroomedAlertEuthymic  Type of Therapy: Individual Therapy  Treatment Goals addressed: Improve psychiatric symptoms, reduce irrational fears (accurately interpret ordinary events and situation), improve unhelpful thought patterns, emotional regulation (stress tolerance, stress reduction), learn about diagnosis, healthy coping skills   Interventions: CBT, Motivational Interviewing, grounding and mindfulness techniques, psychoeducation  Summary: Diane [name] is a 67 y.o. female who presents with Bipolar I disorder, most recent episode depressed, severe, with psychotic features.   Suicidal/Homicidal: No - without intent/plan  Therapist Response: Diane [name] met with clinician for an individual session. Diane [name] discussed her psychiatric symptoms, her current life events and her homework. Diane [name]'s mood was up from last week. She reports she has been working on her goals of attempting to contact the city about the bedbugs, contacting building management about the bedbugs, and sorting out her storage unit. Clinician utilized MI OARS to reflect and summarize thoughts and feelings about her accomplishments. Clinician explored next steps on her action plan toward finding a new apartment. Clinician discussed thoughts about PHP and Diane [name] agreed that this would be a good option for her once she can get her housing settled.   Plan: Return again in 1 weeks.  Diagnosis:     Axis I: Bipolar I disorder, most recent episode depressed, severe, with psychotic features.   Jobe Marker [place], LCSW 08/17/2017

## 2017-08-21 ENCOUNTER — Other Ambulatory Visit: Payer: Self-pay

## 2017-08-21 ENCOUNTER — Ambulatory Visit: Payer: Medicare PPO | Attending: Internal Medicine | Admitting: Internal Medicine

## 2017-08-21 ENCOUNTER — Encounter: Payer: Self-pay | Admitting: Internal Medicine

## 2017-08-21 ENCOUNTER — Ambulatory Visit (HOSPITAL_COMMUNITY)
Admission: RE | Admit: 2017-08-21 | Discharge: 2017-08-21 | Disposition: A | Discharge status: Home / Self Care | Payer: Medicare PPO | Source: Ambulatory Visit | Attending: Family Medicine | Admitting: Family Medicine

## 2017-08-21 VITALS — BP 156/96 | HR 107 | Temp 98.4°F | Resp 16 | Wt 218.0 lb

## 2017-08-21 DIAGNOSIS — Z89411 Acquired absence of right great toe: Secondary | ICD-10-CM | POA: Diagnosis not present

## 2017-08-21 DIAGNOSIS — E1122 Type 2 diabetes mellitus with diabetic chronic kidney disease: Secondary | ICD-10-CM | POA: Insufficient documentation

## 2017-08-21 DIAGNOSIS — R0602 Shortness of breath: Secondary | ICD-10-CM | POA: Diagnosis not present

## 2017-08-21 DIAGNOSIS — I10 Essential (primary) hypertension: Secondary | ICD-10-CM | POA: Diagnosis not present

## 2017-08-21 DIAGNOSIS — Z9842 Cataract extraction status, left eye: Secondary | ICD-10-CM | POA: Insufficient documentation

## 2017-08-21 DIAGNOSIS — I129 Hypertensive chronic kidney disease with stage 1 through stage 4 chronic kidney disease, or unspecified chronic kidney disease: Secondary | ICD-10-CM | POA: Diagnosis not present

## 2017-08-21 DIAGNOSIS — S0990XS Unspecified injury of head, sequela: Secondary | ICD-10-CM | POA: Diagnosis not present

## 2017-08-21 DIAGNOSIS — Z79899 Other long term (current) drug therapy: Secondary | ICD-10-CM | POA: Diagnosis not present

## 2017-08-21 DIAGNOSIS — Z794 Long term (current) use of insulin: Secondary | ICD-10-CM | POA: Insufficient documentation

## 2017-08-21 DIAGNOSIS — R413 Other amnesia: Secondary | ICD-10-CM | POA: Diagnosis not present

## 2017-08-21 DIAGNOSIS — Z90710 Acquired absence of both cervix and uterus: Secondary | ICD-10-CM | POA: Diagnosis not present

## 2017-08-21 DIAGNOSIS — E559 Vitamin D deficiency, unspecified: Secondary | ICD-10-CM | POA: Insufficient documentation

## 2017-08-21 DIAGNOSIS — Z91040 Latex allergy status: Secondary | ICD-10-CM | POA: Insufficient documentation

## 2017-08-21 DIAGNOSIS — R079 Chest pain, unspecified: Secondary | ICD-10-CM

## 2017-08-21 DIAGNOSIS — E118 Type 2 diabetes mellitus with unspecified complications: Secondary | ICD-10-CM | POA: Diagnosis not present

## 2017-08-21 DIAGNOSIS — N183 Chronic kidney disease, stage 3 (moderate): Secondary | ICD-10-CM | POA: Diagnosis not present

## 2017-08-21 DIAGNOSIS — R9439 Abnormal result of other cardiovascular function study: Secondary | ICD-10-CM | POA: Insufficient documentation

## 2017-08-21 DIAGNOSIS — Z888 Allergy status to other drugs, medicaments and biological substances status: Secondary | ICD-10-CM | POA: Diagnosis not present

## 2017-08-21 DIAGNOSIS — R000 Tachycardia, unspecified: Secondary | ICD-10-CM

## 2017-08-21 DIAGNOSIS — Z881 Allergy status to other antibiotic agents status: Secondary | ICD-10-CM | POA: Insufficient documentation

## 2017-08-21 DIAGNOSIS — E1142 Type 2 diabetes mellitus with diabetic polyneuropathy: Secondary | ICD-10-CM | POA: Diagnosis not present

## 2017-08-21 DIAGNOSIS — I447 Left bundle-branch block, unspecified: Secondary | ICD-10-CM | POA: Insufficient documentation

## 2017-08-21 DIAGNOSIS — M797 Fibromyalgia: Secondary | ICD-10-CM | POA: Diagnosis not present

## 2017-08-21 DIAGNOSIS — Z89412 Acquired absence of left great toe: Secondary | ICD-10-CM | POA: Diagnosis not present

## 2017-08-21 DIAGNOSIS — Z886 Allergy status to analgesic agent status: Secondary | ICD-10-CM | POA: Insufficient documentation

## 2017-08-21 DIAGNOSIS — F419 Anxiety disorder, unspecified: Secondary | ICD-10-CM | POA: Diagnosis not present

## 2017-08-21 DIAGNOSIS — G4733 Obstructive sleep apnea (adult) (pediatric): Secondary | ICD-10-CM | POA: Insufficient documentation

## 2017-08-21 DIAGNOSIS — Z9049 Acquired absence of other specified parts of digestive tract: Secondary | ICD-10-CM | POA: Insufficient documentation

## 2017-08-21 DIAGNOSIS — Z961 Presence of intraocular lens: Secondary | ICD-10-CM | POA: Diagnosis not present

## 2017-08-21 DIAGNOSIS — Z9841 Cataract extraction status, right eye: Secondary | ICD-10-CM | POA: Insufficient documentation

## 2017-08-21 DIAGNOSIS — Z880 Allergy status to penicillin: Secondary | ICD-10-CM | POA: Diagnosis not present

## 2017-08-21 LAB — GLUCOSE, POCT (MANUAL RESULT ENTRY): POC Glucose: 157 mg/dl — AB (ref 70–99)

## 2017-08-21 MED ORDER — LOSARTAN POTASSIUM 50 MG PO TABS: 50.0000 mg | ORAL_TABLET | Freq: Every day | ORAL | 3 refills | Status: AC

## 2017-08-21 MED ORDER — FUROSEMIDE 20 MG PO TABS: 20.0000 mg | ORAL_TABLET | Freq: Every day | ORAL | 3 refills | Status: DC
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
## 2017-08-22 ENCOUNTER — Other Ambulatory Visit (HOSPITAL_COMMUNITY): Payer: Self-pay | Admitting: Psychiatry

## 2017-08-22 ENCOUNTER — Encounter: Payer: Self-pay | Admitting: Internal Medicine

## 2017-08-23 ENCOUNTER — Ambulatory Visit: Payer: Self-pay | Admitting: Family Medicine

## 2017-08-24 ENCOUNTER — Encounter (HOSPITAL_COMMUNITY): Payer: Self-pay | Admitting: Licensed Clinical Social Worker

## 2017-08-24 ENCOUNTER — Ambulatory Visit (INDEPENDENT_AMBULATORY_CARE_PROVIDER_SITE_OTHER): Payer: Medicare PPO | Admitting: Licensed Clinical Social Worker

## 2017-08-24 DIAGNOSIS — F315 Bipolar disorder, current episode depressed, severe, with psychotic features: Secondary | ICD-10-CM

## 2017-08-24 DIAGNOSIS — F411 Generalized anxiety disorder: Secondary | ICD-10-CM
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
## 2017-08-30 ENCOUNTER — Ambulatory Visit (INDEPENDENT_AMBULATORY_CARE_PROVIDER_SITE_OTHER): Payer: Medicare PPO | Admitting: Psychiatry

## 2017-08-30 ENCOUNTER — Encounter (HOSPITAL_COMMUNITY): Payer: Self-pay | Admitting: Psychiatry

## 2017-08-30 DIAGNOSIS — Z62811 Personal history of psychological abuse in childhood: Secondary | ICD-10-CM | POA: Diagnosis not present

## 2017-08-30 DIAGNOSIS — F411 Generalized anxiety disorder: Secondary | ICD-10-CM

## 2017-08-30 DIAGNOSIS — J449 Chronic obstructive pulmonary disease, unspecified: Secondary | ICD-10-CM

## 2017-08-30 DIAGNOSIS — F4310 Post-traumatic stress disorder, unspecified: Secondary | ICD-10-CM | POA: Diagnosis not present

## 2017-08-30 DIAGNOSIS — R05 Cough: Secondary | ICD-10-CM | POA: Diagnosis not present

## 2017-08-30 DIAGNOSIS — F331 Major depressive disorder, recurrent, moderate: Secondary | ICD-10-CM | POA: Diagnosis not present

## 2017-08-30 DIAGNOSIS — M549 Dorsalgia, unspecified: Secondary | ICD-10-CM

## 2017-08-30 DIAGNOSIS — Z62810 Personal history of physical and sexual abuse in childhood: Secondary | ICD-10-CM | POA: Diagnosis not present

## 2017-08-30 DIAGNOSIS — M2550 Pain in unspecified joint: Secondary | ICD-10-CM

## 2017-08-30 DIAGNOSIS — R450 Nervousness: Secondary | ICD-10-CM

## 2017-08-30 DIAGNOSIS — Z915 Personal history of self-harm: Secondary | ICD-10-CM | POA: Diagnosis not present

## 2017-08-30 MED ORDER — CLONAZEPAM 0.5 MG PO TABS
0.5000 mg | ORAL_TABLET | Freq: Two times a day (BID) | ORAL | 1 refills | Status: DC
Start: 2017-08-30 — End: 2017-09-27

## 2017-08-30 MED ORDER — LAMOTRIGINE 100 MG PO TABS: 100.0000 mg | ORAL_TABLET | Freq: Every day | ORAL | 1 refills | Status: AC

## 2017-08-30 MED ORDER — CHLORPROMAZINE HCL 25 MG PO TABS
25.0000 mg | ORAL_TABLET | Freq: Every day | ORAL | 1 refills | Status: AC
Start: 2017-08-30 — End: ?
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
## 2017-08-31 ENCOUNTER — Encounter (HOSPITAL_COMMUNITY): Payer: Self-pay | Admitting: Licensed Clinical Social Worker

## 2017-08-31 ENCOUNTER — Ambulatory Visit (INDEPENDENT_AMBULATORY_CARE_PROVIDER_SITE_OTHER): Payer: Medicare PPO | Admitting: Licensed Clinical Social Worker

## 2017-08-31 DIAGNOSIS — F315 Bipolar disorder, current episode depressed, severe, with psychotic features: Secondary | ICD-10-CM
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
## 2017-09-01 ENCOUNTER — Ambulatory Visit: Payer: Self-pay | Admitting: Family Medicine

## 2017-09-04 ENCOUNTER — Ambulatory Visit: Payer: Medicare PPO | Attending: Internal Medicine | Admitting: Internal Medicine

## 2017-09-04 ENCOUNTER — Encounter: Payer: Self-pay | Admitting: Internal Medicine

## 2017-09-04 VITALS — BP 163/114 | HR 104 | Temp 98.6°F | Resp 16 | Wt 213.6 lb

## 2017-09-04 DIAGNOSIS — G44329 Chronic post-traumatic headache, not intractable: Secondary | ICD-10-CM

## 2017-09-04 DIAGNOSIS — N183 Chronic kidney disease, stage 3 (moderate): Secondary | ICD-10-CM | POA: Diagnosis not present

## 2017-09-04 DIAGNOSIS — I10 Essential (primary) hypertension: Secondary | ICD-10-CM | POA: Diagnosis not present

## 2017-09-04 DIAGNOSIS — Z794 Long term (current) use of insulin: Secondary | ICD-10-CM | POA: Insufficient documentation

## 2017-09-04 DIAGNOSIS — Z9889 Other specified postprocedural states: Secondary | ICD-10-CM | POA: Diagnosis not present

## 2017-09-04 DIAGNOSIS — E1122 Type 2 diabetes mellitus with diabetic chronic kidney disease: Secondary | ICD-10-CM | POA: Diagnosis not present

## 2017-09-04 DIAGNOSIS — R0602 Shortness of breath: Secondary | ICD-10-CM | POA: Insufficient documentation

## 2017-09-04 DIAGNOSIS — A084 Viral intestinal infection, unspecified: Secondary | ICD-10-CM | POA: Diagnosis not present

## 2017-09-04 DIAGNOSIS — R079 Chest pain, unspecified: Secondary | ICD-10-CM | POA: Diagnosis not present

## 2017-09-04 DIAGNOSIS — M2550 Pain in unspecified joint: Secondary | ICD-10-CM | POA: Diagnosis not present

## 2017-09-04 DIAGNOSIS — L84 Corns and callosities: Secondary | ICD-10-CM

## 2017-09-04 DIAGNOSIS — Z8639 Personal history of other endocrine, nutritional and metabolic disease: Secondary | ICD-10-CM | POA: Diagnosis not present

## 2017-09-04 DIAGNOSIS — Z90710 Acquired absence of both cervix and uterus: Secondary | ICD-10-CM | POA: Diagnosis not present

## 2017-09-04 DIAGNOSIS — I129 Hypertensive chronic kidney disease with stage 1 through stage 4 chronic kidney disease, or unspecified chronic kidney disease: Secondary | ICD-10-CM | POA: Diagnosis present

## 2017-09-04 DIAGNOSIS — Z79899 Other long term (current) drug therapy: Secondary | ICD-10-CM | POA: Diagnosis not present

## 2017-09-04 LAB — GLUCOSE, POCT (MANUAL RESULT ENTRY): POC Glucose: 131 mg/dl — AB (ref 70–99)

## 2017-09-04 MED ORDER — CARVEDILOL 3.125 MG PO TABS: 3.1250 mg | ORAL_TABLET | Freq: Two times a day (BID) | ORAL | 5 refills | Status: DC

## 2017-09-04 MED ORDER — ONDANSETRON HCL 4 MG PO TABS: 4.0000 mg | ORAL_TABLET | Freq: Four times a day (QID) | ORAL | 0 refills | Status: AC | PRN

## 2017-09-04 MED ORDER — DICLOFENAC SODIUM 1 % TD GEL: 2.0000 g | Freq: Four times a day (QID) | TRANSDERMAL | 1 refills | Status: DC

## 2017-09-04 NOTE — Patient Instructions (Addendum)
Use Pepto Bismol as needed for abdominal upset.   Your blood pressure is not well control.  Continue Losartan.  Add Carvedilol 3.25 mg twice a day.  Try to limit salt in the foods.   I have referred you to Podiatry and Rheumatology.

## 2017-09-04 NOTE — Progress Notes (Signed)
Patient ID: Diane [name], female    DOB: 1950-06-19  MRN: [id number]  CC: No chief complaint on file.   Subjective: Diane [name] is a 67 y.o. female who presents for 2 weeks follow-up Her concerns today include:  DM with neuropathy, CKD stage 3, chronic anemia,  HTN, OSA (not using CPAP too claustophobia), fibromyalgia, HL, anxiety/dep (Dr. Adele Schilder)  Last visit patient complained of intermittent chest tightness and shortness of breath when moving around her apartment.  EKG revealed a left bundle branch block.  He had 1+ lower extremity edema on exam.  Furosemide was restarted.  BNP, chest x-ray and pulmonary function tests were ordered.  Patient referred to cardiology.  She did not have the blood work or chest x-ray done as yet.  PFTs are scheduled for tomorrow.  We are awaiting cardiology appointment.  2.  She was referred to neurology for evaluation of poor memory in the setting of previous head injuries during falls.  Her appointment is scheduled for June.  Patient states today that she forgot to tell me about chronic headaches that she has been having ever since her last fall in January or February of this year.  Headaches few times a week and usually on the right side but can be all over.    3.  Request referral to podiatry for DM foot care.  She has calluses on the sole of the right foot  4.  Req rheumatology for arthritis in hands, LT ankle, RT shoulder.  Morning stiffness lasting several hrs. Reports being told she has OA and was seeing Rheumatologist in [place].  Was on Norco and Gabapentin  5.  C/o N/V and diarrhea since this past wkend.  Fever of 99.1.  This past weekend.  No BM or vomiting in past 24 hrs.  She has been using PeptoBismol Hx of IBS  6.  HTN; reports compliance with Cozaar. Patient Active Problem List   Diagnosis Date Noted  . Cellulitis 07/24/2017  . Pain in left leg 04/20/2017  . Charcot foot due to diabetes mellitus ([place]) 02/21/2017  . Unilateral  primary osteoarthritis, right knee 09/07/2016  . Pain in right hip 09/07/2016  . Vitamin D deficiency 07/25/2016  . Diabetic ulcer of right heel associated with type 2 diabetes mellitus, limited to breakdown of skin ([place]) 05/12/2016  . Osteomyelitis of ankle or foot   . Diabetic ulcer of left heel associated with type 2 diabetes mellitus, with bone involvement without evidence of necrosis ([place])   . Right foot ulcer, limited to breakdown of skin ([place]) 04/26/2016  . Hypertension 04/26/2016  . DM (diabetes mellitus) type II controlled with renal manifestation ([place]) 04/26/2016  . Fibromyalgia 04/26/2016  . Diabetic polyneuropathy ([place]) 04/26/2016     Current Outpatient Medications on File Prior to Visit  Medication Sig Dispense Refill  . acetaminophen (TYLENOL) 325 MG tablet Take 2 tablets (650 mg total) by mouth every 6 (six) hours as needed for mild pain (or Fever >/= 101). 20 tablet   . Blood Glucose Monitoring Suppl (ACCU-CHEK AVIVA PLUS) w/Device KIT Use as directed for 3 times daily testing of blood glucose. E11.9 1 kit 0  . cetirizine (ZYRTEC) 10 MG tablet Take 1 tablet (10 mg total) by mouth daily. 30 tablet 11  . chlorproMAZINE (THORAZINE) 25 MG tablet Take 1 tablet (25 mg total) by mouth at bedtime. 30 tablet 1  . clindamycin (CLEOCIN) 300 MG capsule Take 1 capsule (300 mg total) by mouth 3 (three) times daily.  21 capsule 0  . clonazePAM (KLONOPIN) 0.5 MG tablet Take 1 tablet (0.5 mg total) by mouth 2 (two) times daily. 60 tablet 1  . diphenhydrAMINE (BENADRYL) 25 MG tablet Take 25 mg by mouth every 6 (six) hours as needed for itching.     . donepezil (ARICEPT) 5 MG tablet Take 1 tablet (5 mg total) by mouth at bedtime. 30 tablet 5  . ergocalciferol (VITAMIN D2) 50000 units capsule Take 1 capsule (50,000 Units total) by mouth once a week. 12 capsule 0  . famotidine (PEPCID) 20 MG tablet Take 1 tablet (20 mg total) by mouth 2 (two) times daily. 60 tablet 3  . furosemide (LASIX) 20 MG  tablet Take 1 tablet (20 mg total) by mouth daily. 30 tablet 3  . gabapentin (NEURONTIN) 300 MG capsule Take 2 capsules (600 mg total) by mouth 3 (three) times daily. 180 capsule 3  . glucose blood (ACCU-CHEK AVIVA PLUS) test strip Use as instructed for 3 times daily testing of blood glucose. E.11.9 100 each 5  . HYDROcodone-acetaminophen (NORCO/VICODIN) 5-325 MG tablet Take 1 tablet by mouth every 4 (four) hours as needed for moderate pain. 15 tablet 0  . insulin aspart (NOVOLOG) 100 UNIT/ML FlexPen Inject 1 Units into the skin 3 (three) times daily with meals. Sliding scale; 12 units max per day 3 mL 2  . Insulin Pen Needle (ULTICARE MICRO PEN NEEDLES) 32G X 4 MM MISC 1 applicator by Does not apply route at bedtime. 100 each 2  . lactobacillus acidophilus & bulgar (LACTINEX) chewable tablet Chew 1 tablet by mouth 3 (three) times daily with meals. 30 tablet 0  . lamoTRIgine (LAMICTAL) 100 MG tablet Take 1 tablet (100 mg total) by mouth daily. 30 tablet 1  . losartan (COZAAR) 50 MG tablet Take 1 tablet (50 mg total) by mouth daily. 90 tablet 3  . mupirocin ointment (BACTROBAN) 2 % Apply to any area that appears infected 2X daily 30 g 1  . oxybutynin (DITROPAN-XL) 10 MG 24 hr tablet Take 1 tablet (10 mg total) by mouth at bedtime. 90 tablet 3  . pravastatin (PRAVACHOL) 20 MG tablet Take 1 tablet (20 mg total) by mouth daily. 90 tablet 3  . triamcinolone cream (KENALOG) 0.1 % Apply 1 application topically 2 (two) times daily. 30 g 0   No current facility-administered medications on file prior to visit.     Allergies  Allergen Reactions  . Erythromycin Anaphylaxis  . Biaxin [Clarithromycin] Other (See Comments)  . Feldene [Piroxicam] Diarrhea, Nausea And Vomiting and Other (See Comments)    And sores in mouth  . Nsaids Other (See Comments)    Contraindicated because of her other meds  . Tetracyclines & Related   . Latex Rash  . Penicillins Rash and Other (See Comments)    Has patient had a  PCN reaction causing immediate rash, facial/tongue/throat swelling, SOB or lightheadedness with hypotension: Yes Has patient had a PCN reaction causing severe rash involving mucus membranes or skin necrosis: No Has patient had a PCN reaction that required hospitalization No Has patient had a PCN reaction occurring within the last 10 years: No If all of the above answers are "NO", then may proceed with Cephalosporin use. And sores in her mouth   . Tape Rash    Social History   Socioeconomic History  . Marital status: Single    Spouse name: Not on file  . Number of children: Not on file  . Years of education: Not on  file  . Highest education level: Not on file  Occupational History  . Not on file  Social Needs  . Financial resource strain: Not on file  . Food insecurity:    Worry: Not on file    Inability: Not on file  . Transportation needs:    Medical: Not on file    Non-medical: Not on file  Tobacco Use  . Smoking status: Never Smoker  . Smokeless tobacco: Never Used  Substance and Sexual Activity  . Alcohol use: No  . Drug use: No  . Sexual activity: Never  Lifestyle  . Physical activity:    Days per week: Not on file    Minutes per session: Not on file  . Stress: Not on file  Relationships  . Social connections:    Talks on phone: Not on file    Gets together: Not on file    Attends religious service: Not on file    Active member of club or organization: Not on file    Attends meetings of clubs or organizations: Not on file    Relationship status: Not on file  . Intimate partner violence:    Fear of current or ex partner: Not on file    Emotionally abused: Not on file    Physically abused: Not on file    Forced sexual activity: Not on file  Other Topics Concern  . Not on file  Social History Narrative  . Not on file    No family history on file.  Past Surgical History:  Procedure Laterality Date  . ABDOMINAL HYSTERECTOMY     "partial"  .  APPENDECTOMY    . CALCANEAL OSTEOTOMY Left 04/28/2016   Procedure: CALCANEAL OSTEOTOMY;  Surgeon: Newt Minion, MD;  Location: [facility];  Service: Orthopedics;  Laterality: Left;  . CATARACT EXTRACTION W/ INTRAOCULAR LENS  IMPLANT, BILATERAL Bilateral   . FOOT SURGERY Left 2015  . FOOT SURGERY Right 04/2014   subtalar joint arthrodesis, talonavicualr joint arthrodesis, hammertoe repair 2,4,5 right foot  . FRACTURE SURGERY    . LAPAROSCOPIC CHOLECYSTECTOMY    . OPEN REDUCTION INTERNAL FIXATION (ORIF) TIBIA/FIBULA FRACTURE Left   . TOE AMPUTATION Bilateral    toe hallux   . TONSILLECTOMY AND ADENOIDECTOMY      ROS: Review of Systems Except as stated above PHYSICAL EXAM: BP (!) 163/114   Pulse (!) 104   Temp 98.6 F (37 C) (Oral)   Resp 16   Wt 213 lb 9.6 oz (96.9 kg)   SpO2 94%   BMI 40.36 kg/m   Repeat BP 160/90 Physical Exam  General appearance - alert, well appearing, and in no distress Mental status - alert, oriented to person, place, and time, normal mood, behavior, speech, dress, motor activity, and thought processes Neck - supple, no significant adenopathy Chest - clear to auscultation, no wheezes, rales or rhonchi, symmetric air entry Heart - normal rate, regular rhythm, normal S1, S2, no murmurs, rubs, clicks or gallops Musculoskeletal -hands: No signs of active tenosynovitis.  Good range of motion of the fingers on the wrists.  Right shoulder: Mild discomfort with passive range of motion.   Extremities -trace lower extremity edema. Neuro: Follows commands appropriately.  Cranial nerves grossly intact.  Power in the upper extremities 5/5 bilaterally.  Power lower extremities 4+/5.  Patient ambulates with rolling walker Feet: Pre-ulcerative callus x2 on sole of the right foot.   ASSESSMENT AND PLAN: 1. History of diabetic neuropathy 2. Pre-ulcerative corn or callous -  POCT glucose (manual entry) - Ambulatory referral to Podiatry  3. Polyarthralgia - diclofenac  sodium (VOLTAREN) 1 % GEL; Apply 2 g topically 4 (four) times daily.  Dispense: 100 g; Refill: 1 - Ambulatory referral to Rheumatology  4. Essential hypertension Not at goal.  Add carvedilol. - carvedilol (COREG) 3.125 MG tablet; Take 1 tablet (3.125 mg total) by mouth 2 (two) times daily with a meal.  Dispense: 60 tablet; Refill: 5  5. Chronic post-traumatic headache, not intractable - CT Head Wo Contrast; Future  6. Viral gastroenteritis Encourage fluid intake.  Continue Pepto-Bismol as needed. - ondansetron (ZOFRAN) 4 MG tablet; Take 1 tablet (4 mg total) by mouth every 6 (six) hours as needed for nausea.  Dispense: 20 tablet; Refill: 0  7.  Chest pain/shortness of breath Patient will stop after lab today to have blood test done that were ordered on previous visit.  Keep appointment for PFTs tomorrow.  Await cardiology referral.  In the meantime I recommend taking low-dose aspirin but patient states that she does not tolerate aspirin because it causes ringing in the ears.  Patient advised to be seen in the ER if she has any chest pain that does not resolve with rest Patient was given the opportunity to ask questions.  Patient verbalized understanding of the plan and was able to repeat key elements of the plan.   Orders Placed This Encounter  Procedures  . CT Head Wo Contrast  . Ambulatory referral to Podiatry  . Ambulatory referral to Rheumatology  . POCT glucose (manual entry)     Requested Prescriptions   Signed Prescriptions Disp Refills  . carvedilol (COREG) 3.125 MG tablet 60 tablet 5    Sig: Take 1 tablet (3.125 mg total) by mouth 2 (two) times daily with a meal.  . diclofenac sodium (VOLTAREN) 1 % GEL 100 g 1    Sig: Apply 2 g topically 4 (four) times daily.  . ondansetron (ZOFRAN) 4 MG tablet 20 tablet 0    Sig: Take 1 tablet (4 mg total) by mouth every 6 (six) hours as needed for nausea.    Return in about 2 months (around 11/04/2017).  Karle Plumber, MD, FACP

## 2017-09-05 ENCOUNTER — Ambulatory Visit (HOSPITAL_COMMUNITY)
Admission: RE | Admit: 2017-09-05 | Discharge: 2017-09-05 | Disposition: A | Discharge status: Home / Self Care | Payer: Medicare PPO | Source: Ambulatory Visit | Attending: Internal Medicine | Admitting: Internal Medicine

## 2017-09-05 DIAGNOSIS — J449 Chronic obstructive pulmonary disease, unspecified: Secondary | ICD-10-CM | POA: Diagnosis not present

## 2017-09-05 DIAGNOSIS — R0602 Shortness of breath: Secondary | ICD-10-CM | POA: Diagnosis not present

## 2017-09-05 LAB — PULMONARY FUNCTION TEST
DL/VA % pred: 104 %
DL/VA: 4.59 ml/min/mmHg/L
DLCO COR % PRED: 49 %
DLCO UNC % PRED: 42 %
DLCO UNC: 8.52 ml/min/mmHg
DLCO cor: 10.06 ml/min/mmHg
FEF 25-75 POST: 1.32 L/s
FEF 25-75 PRE: 1.16 L/s
FEF2575-%Change-Post: 13 %
FEF2575-%PRED-POST: 80 %
FEF2575-%PRED-PRE: 70 %
FEV1-%CHANGE-POST: 4 %
FEV1-%Pred-Post: 61 %
FEV1-%Pred-Pre: 58 %
FEV1-POST: 1.04 L
FEV1-Pre: 0.99 L
FEV1FVC-%Change-Post: 0 %
FEV1FVC-%PRED-PRE: 106 %
FEV6-%CHANGE-POST: 5 %
FEV6-%PRED-POST: 59 %
FEV6-%Pred-Pre: 56 %
FEV6-Post: 1.24 L
FEV6-Pre: 1.18 L
FEV6FVC-%CHANGE-POST: 0 %
FEV6FVC-%Pred-Post: 103 %
FEV6FVC-%Pred-Pre: 104 %
FVC-%Change-Post: 4 %
FVC-%Pred-Post: 57 %
FVC-%Pred-Pre: 54 %
FVC-Post: 1.24 L
FVC-Pre: 1.19 L
POST FEV1/FVC RATIO: 83 %
PRE FEV1/FVC RATIO: 83 %
Post FEV6/FVC ratio: 100 %
Pre FEV6/FVC Ratio: 100 %
RV % pred: 71 %
RV: 1.4 L
TLC % pred: 62 %
TLC: 2.89 L

## 2017-09-05 LAB — COMPREHENSIVE METABOLIC PANEL
ALK PHOS: 81 IU/L (ref 39–117)
ALT: 15 IU/L (ref 0–32)
AST: 17 IU/L (ref 0–40)
Albumin/Globulin Ratio: 1.1 — ABNORMAL LOW (ref 1.2–2.2)
Albumin: 3.7 g/dL (ref 3.6–4.8)
BILIRUBIN TOTAL: 0.2 mg/dL (ref 0.0–1.2)
BUN / CREAT RATIO: 13 (ref 12–28)
BUN: 21 mg/dL (ref 8–27)
CHLORIDE: 108 mmol/L — AB (ref 96–106)
CO2: 21 mmol/L (ref 20–29)
Calcium: 8.7 mg/dL (ref 8.7–10.3)
Creatinine, Ser: 1.56 mg/dL — ABNORMAL HIGH (ref 0.57–1.00)
GFR calc Af Amer: 40 mL/min/{1.73_m2} — ABNORMAL LOW (ref >59)
GFR calc non Af Amer: 35 mL/min/{1.73_m2} — ABNORMAL LOW (ref >59)
GLUCOSE: 126 mg/dL — AB (ref 65–99)
Globulin, Total: 3.3 g/dL (ref 1.5–4.5)
POTASSIUM: 4.5 mmol/L (ref 3.5–5.2)
SODIUM: 145 mmol/L — AB (ref 134–144)
Total Protein: 7 g/dL (ref 6.0–8.5)

## 2017-09-05 LAB — CBC
HEMOGLOBIN: 9.3 g/dL — AB (ref 11.1–15.9)
Hematocrit: 31.8 % — ABNORMAL LOW (ref 34.0–46.6)
MCH: 23.3 pg — AB (ref 26.6–33.0)
MCHC: 29.2 g/dL — AB (ref 31.5–35.7)
MCV: 80 fL (ref 79–97)
Platelets: 324 10*3/uL (ref 150–379)
RBC: 4 x10E6/uL (ref 3.77–5.28)
RDW: 17.6 % — ABNORMAL HIGH (ref 12.3–15.4)
WBC: 8.4 10*3/uL (ref 3.4–10.8)

## 2017-09-05 LAB — TSH: TSH: 2.77 u[IU]/mL (ref 0.450–4.500)

## 2017-09-05 LAB — BRAIN NATRIURETIC PEPTIDE: BNP: 978.5 pg/mL — ABNORMAL HIGH (ref 0.0–100.0)

## 2017-09-05 MED ORDER — ALBUTEROL SULFATE (2.5 MG/3ML) 0.083% IN NEBU: 2.5000 mg | INHALATION_SOLUTION | Freq: Once | RESPIRATORY_TRACT | Status: DC

## 2017-09-06 ENCOUNTER — Encounter (HOSPITAL_COMMUNITY): Payer: Self-pay | Admitting: Licensed Clinical Social Worker

## 2017-09-06 ENCOUNTER — Encounter: Payer: Self-pay | Admitting: Internal Medicine

## 2017-09-06 ENCOUNTER — Other Ambulatory Visit: Payer: Self-pay | Admitting: Internal Medicine

## 2017-09-06 ENCOUNTER — Ambulatory Visit (INDEPENDENT_AMBULATORY_CARE_PROVIDER_SITE_OTHER): Payer: Medicare PPO | Admitting: Licensed Clinical Social Worker

## 2017-09-06 DIAGNOSIS — D509 Iron deficiency anemia, unspecified: Secondary | ICD-10-CM

## 2017-09-06 DIAGNOSIS — R0602 Shortness of breath: Secondary | ICD-10-CM

## 2017-09-06 DIAGNOSIS — F315 Bipolar disorder, current episode depressed, severe, with psychotic features: Secondary | ICD-10-CM

## 2017-09-06 MED ORDER — FERROUS SULFATE 325 (65 FE) MG PO TABS: 325.0000 mg | ORAL_TABLET | Freq: Every day | ORAL | 3 refills | Status: AC

## 2017-09-06 MED ORDER — FUROSEMIDE 20 MG PO TABS: 30.0000 mg | ORAL_TABLET | Freq: Every day | ORAL | 6 refills | Status: DC

## 2017-09-06 NOTE — Progress Notes (Signed)
   THERAPIST PROGRESS NOTE  Session Time: 10:40-11:40  Participation Level: Active  Behavioral Response: Well GroomedAlertEuthymic  Type of Therapy: Individual Therapy  Treatment Goals addressed: Improve psychiatric symptoms, reduce irrational fears (accurately interpret ordinary events and situation), improve unhelpful thought patterns, emotional regulation (stress tolerance, stress reduction), learn about diagnosis, healthy coping skills   Interventions: CBT, Motivational Interviewing, grounding and mindfulness techniques, psychoeducation  Summary: Diane [name] is a 67 y.o. female who presents with Bipolar I disorder, most recent episode depressed, severe, with psychotic features.   Suicidal/Homicidal: No - without intent/plan  Therapist Response: Diane [name] met with clinician for an individual session. Diane [name] discussed her psychiatric symptoms, her current life events and her homework. Brina reports she has been busy this week with appointments, but she has been feeling better. Diane [name] shared her photo album of Diane [name] and photos with him. Clinician reflected pride and amusement at her own hx of being so direct, forthcoming, and outgoing. Diane [name] reports she was very bold as a younger woman. Clinician discussed ways to re-ignite that boldness. Diane [name] discussed past relationships and interactions with men in the past. She discussed these topics in a relatively immature manner and was somewhat shy about these topics. Clinician explored current or future plans for a relationship, but Diane [name] reported that that part of her life is most likely over. Diane [name] brought in afgans and cross-stitch to share for the community engagement event.   Plan: Return again in 1 weeks. Refer to [facility] to get care manager.   Diagnosis:     Axis I: Bipolar I disorder, most recent episode depressed, severe, with psychotic features.   [place], LCSW 09/06/2017

## 2017-09-06 NOTE — Progress Notes (Signed)
Below is lab result message sent to CMA to contact pt: Let patient know that she is still anemic.  Iron studies from last year consistent iron deficiency.  I recommend taking iron supplement daily.  Prescription sent to her pharmacy.  Further work-up for this type of anemia should include evaluation of the gastrointestinal tract with colonoscopy and endoscopy to rule out any type of gastrointestinal cancer.  Will refer to gastroenterology. Kidney function still not 100% but stable. Lab results also indicate that she may have component of congestive heart failure contributing to the shortness of breath and lower extremity swelling.  Increase Furosemide to 1 1/2 tab daily. She will need to have a study done called Echo to evaluate heart function.  I have placed the order.  Please schedule.

## 2017-09-07 ENCOUNTER — Telehealth: Payer: Self-pay

## 2017-09-07 ENCOUNTER — Ambulatory Visit (INDEPENDENT_AMBULATORY_CARE_PROVIDER_SITE_OTHER): Payer: Medicare PPO | Admitting: Orthopedic Surgery

## 2017-09-07 ENCOUNTER — Telehealth: Payer: Self-pay | Admitting: Internal Medicine

## 2017-09-07 NOTE — Telephone Encounter (Signed)
Patient called and requested to speak with pcp nurse. Please fu at your earliest convenience.

## 2017-09-07 NOTE — Telephone Encounter (Signed)
Contacted pt to go over lab results pt is aware of results and doesn't have any questions or concerns 

## 2017-09-11 NOTE — Telephone Encounter (Signed)
Will forward to pcp

## 2017-09-12 NOTE — Telephone Encounter (Signed)
Contacted and informed her of Dr. Laural Benes response pt is aware. I asked pt if she was still having sob pt states she is pt states she has an appointment schedule with the walk-in and if I wanted her to cancel I informed pt that I do. Pt states she will go to the ED. I informed pcp

## 2017-09-13 ENCOUNTER — Ambulatory Visit: Payer: Self-pay

## 2017-09-14 ENCOUNTER — Ambulatory Visit (HOSPITAL_COMMUNITY): Payer: Medicare PPO | Admitting: Licensed Clinical Social Worker

## 2017-09-20 ENCOUNTER — Encounter (HOSPITAL_COMMUNITY): Payer: Self-pay | Admitting: Licensed Clinical Social Worker

## 2017-09-20 ENCOUNTER — Ambulatory Visit (INDEPENDENT_AMBULATORY_CARE_PROVIDER_SITE_OTHER): Payer: Medicare PPO | Admitting: Licensed Clinical Social Worker

## 2017-09-20 DIAGNOSIS — F411 Generalized anxiety disorder: Secondary | ICD-10-CM

## 2017-09-20 DIAGNOSIS — F315 Bipolar disorder, current episode depressed, severe, with psychotic features: Secondary | ICD-10-CM | POA: Diagnosis not present

## 2017-09-20 NOTE — Progress Notes (Signed)
   THERAPIST PROGRESS NOTE  Session Time: 1:30pm-2:20pm  Participation Level: Active  Behavioral Response: NeatAlertDepressed  Type of Therapy: Individual Therapy  Treatment Goals addressed: Improve psychiatric symptoms, reduce irrational fears (accurately interpret ordinary events and situation), improve unhelpful thought patterns, emotional regulation (stress tolerance, stress reduction), learn about diagnosis, healthy coping skills   Interventions: CBT, Motivational Interviewing, grounding and mindfulness techniques, psychoeducation  Summary: Diane [name] is a 67 y.o. female who presents with Bipolar I disorder, most recent episode depressed, severe, with psychotic features.   Suicidal/Homicidal: No - without intent/plan  Therapist Response: Diane [name] met with clinician for an individual session. Diane [name] discussed her psychiatric symptoms, her current life events and her homework. Diane [name] presented as wheezy, unable to catch breath, somewhat confused, and with some concerns about blood pressure. Clinician explored health issues and noted that Diane [name] did not go to the ED, as she was instructed to do 2 weeks ago by PCP. Diane [name] reported she felt no need to go to ED, as she felt "I'll just sit there for 12 hours and they will not do anything". Diane [name] spent much of the session coughing and spitting up mucus.  In session, clinician explored emotional status and noted some depression and sadness about her family. Clinician processed thoughts and feelings about looking like her mother and feeling so hurt and upset about the way mother treated her during childhood. Diane [name] told more about her childhood and relationship with mother and twin sister. She also was able to identify some formative experiences during teen years.  Following session, nurse met with Diane [name] to take vitals and discuss medications. See following note.   Plan: Return again in 1 weeks.   Diagnosis:     Axis I: Bipolar I disorder, most  recent episode depressed, severe, with psychotic features.   Jobe Marker [place], LCSW 09/20/2017

## 2017-09-20 NOTE — Progress Notes (Signed)
Met with patient after her therapy appt today to assist with checking her blood pressure, per patient request.  Patient admitted to being a little short of breath when ambulating but refuses to go to ED or to see a provider today for evaluation.  States she saw a pulmonologist earlier this week will be seeing a cardiologist this coming Friday 09/22/17.  Patient reported to her therapist she stopped taking all medications this week.  Patient's BP 128/80, pulse 99, and O2 at 92 %.  Reviewed patient's medications and she agreed to take all as prescribed, particularly her lasix and heart medications.  Patient reported she would go to the emergency department or seek out EMS assistance if she began to have any worsening of shortness of breath, chest pain, hurting, etc.  Patient refused to go across the street for an evaluation today as stated she has the appointment set for Friday but acknowledged she does understand the concerns expressed to her today and reports will seek out additional help if needed but denies need currently.  Patient encouraged to keep cardiologist appointment and reports will take medications as prescribed once returns home today.  Patient left the facility with plan to return home today but agrees to seek out further medical care if any distress occurs or any worsening of symptoms but continued to deny need at present.

## 2017-09-22 ENCOUNTER — Encounter (HOSPITAL_COMMUNITY): Payer: Self-pay

## 2017-09-22 ENCOUNTER — Emergency Department (HOSPITAL_COMMUNITY): Payer: Medicare PPO

## 2017-09-22 ENCOUNTER — Other Ambulatory Visit: Payer: Self-pay

## 2017-09-22 ENCOUNTER — Ambulatory Visit (HOSPITAL_BASED_OUTPATIENT_CLINIC_OR_DEPARTMENT_OTHER)
Admission: RE | Admit: 2017-09-22 | Discharge: 2017-09-22 | Disposition: A | Discharge status: Home / Self Care | Payer: Medicare PPO | Source: Ambulatory Visit | Attending: Internal Medicine | Admitting: Internal Medicine

## 2017-09-22 ENCOUNTER — Inpatient Hospital Stay (HOSPITAL_COMMUNITY)
Admission: EM | Admit: 2017-09-22 | Discharge: 2017-09-27 | DRG: 193 | Disposition: A | Discharge status: Skilled Nursing Facility | Payer: Medicare PPO | Attending: Internal Medicine | Admitting: Internal Medicine

## 2017-09-22 DIAGNOSIS — D509 Iron deficiency anemia, unspecified: Secondary | ICD-10-CM | POA: Diagnosis present

## 2017-09-22 DIAGNOSIS — I130 Hypertensive heart and chronic kidney disease with heart failure and stage 1 through stage 4 chronic kidney disease, or unspecified chronic kidney disease: Secondary | ICD-10-CM | POA: Diagnosis present

## 2017-09-22 DIAGNOSIS — J181 Lobar pneumonia, unspecified organism: Principal | ICD-10-CM | POA: Diagnosis present

## 2017-09-22 DIAGNOSIS — J189 Pneumonia, unspecified organism: Secondary | ICD-10-CM

## 2017-09-22 DIAGNOSIS — Y95 Nosocomial condition: Secondary | ICD-10-CM | POA: Diagnosis present

## 2017-09-22 DIAGNOSIS — D573 Sickle-cell trait: Secondary | ICD-10-CM | POA: Diagnosis present

## 2017-09-22 DIAGNOSIS — E11610 Type 2 diabetes mellitus with diabetic neuropathic arthropathy: Secondary | ICD-10-CM | POA: Diagnosis present

## 2017-09-22 DIAGNOSIS — I447 Left bundle-branch block, unspecified: Secondary | ICD-10-CM | POA: Diagnosis present

## 2017-09-22 DIAGNOSIS — Z886 Allergy status to analgesic agent status: Secondary | ICD-10-CM

## 2017-09-22 DIAGNOSIS — F419 Anxiety disorder, unspecified: Secondary | ICD-10-CM | POA: Diagnosis present

## 2017-09-22 DIAGNOSIS — Z91040 Latex allergy status: Secondary | ICD-10-CM | POA: Diagnosis not present

## 2017-09-22 DIAGNOSIS — E1142 Type 2 diabetes mellitus with diabetic polyneuropathy: Secondary | ICD-10-CM | POA: Diagnosis present

## 2017-09-22 DIAGNOSIS — J9601 Acute respiratory failure with hypoxia: Secondary | ICD-10-CM | POA: Diagnosis present

## 2017-09-22 DIAGNOSIS — N3289 Other specified disorders of bladder: Secondary | ICD-10-CM | POA: Diagnosis present

## 2017-09-22 DIAGNOSIS — I5041 Acute combined systolic (congestive) and diastolic (congestive) heart failure: Secondary | ICD-10-CM | POA: Diagnosis not present

## 2017-09-22 DIAGNOSIS — E785 Hyperlipidemia, unspecified: Secondary | ICD-10-CM

## 2017-09-22 DIAGNOSIS — Z8249 Family history of ischemic heart disease and other diseases of the circulatory system: Secondary | ICD-10-CM

## 2017-09-22 DIAGNOSIS — D631 Anemia in chronic kidney disease: Secondary | ICD-10-CM | POA: Diagnosis present

## 2017-09-22 DIAGNOSIS — F319 Bipolar disorder, unspecified: Secondary | ICD-10-CM | POA: Diagnosis present

## 2017-09-22 DIAGNOSIS — I10 Essential (primary) hypertension: Secondary | ICD-10-CM | POA: Diagnosis present

## 2017-09-22 DIAGNOSIS — I272 Pulmonary hypertension, unspecified: Secondary | ICD-10-CM | POA: Diagnosis present

## 2017-09-22 DIAGNOSIS — N183 Chronic kidney disease, stage 3 (moderate): Secondary | ICD-10-CM | POA: Diagnosis present

## 2017-09-22 DIAGNOSIS — Z881 Allergy status to other antibiotic agents status: Secondary | ICD-10-CM

## 2017-09-22 DIAGNOSIS — Z888 Allergy status to other drugs, medicaments and biological substances status: Secondary | ICD-10-CM | POA: Diagnosis not present

## 2017-09-22 DIAGNOSIS — Z590 Homelessness: Secondary | ICD-10-CM | POA: Diagnosis not present

## 2017-09-22 DIAGNOSIS — Z6841 Body Mass Index (BMI) 40.0 and over, adult: Secondary | ICD-10-CM | POA: Diagnosis not present

## 2017-09-22 DIAGNOSIS — E1129 Type 2 diabetes mellitus with other diabetic kidney complication: Secondary | ICD-10-CM | POA: Diagnosis not present

## 2017-09-22 DIAGNOSIS — R0602 Shortness of breath: Secondary | ICD-10-CM

## 2017-09-22 DIAGNOSIS — K589 Irritable bowel syndrome without diarrhea: Secondary | ICD-10-CM | POA: Diagnosis present

## 2017-09-22 DIAGNOSIS — E1122 Type 2 diabetes mellitus with diabetic chronic kidney disease: Secondary | ICD-10-CM | POA: Diagnosis present

## 2017-09-22 DIAGNOSIS — N179 Acute kidney failure, unspecified: Secondary | ICD-10-CM | POA: Diagnosis present

## 2017-09-22 DIAGNOSIS — Z794 Long term (current) use of insulin: Secondary | ICD-10-CM | POA: Diagnosis not present

## 2017-09-22 DIAGNOSIS — R110 Nausea: Secondary | ICD-10-CM

## 2017-09-22 DIAGNOSIS — F411 Generalized anxiety disorder: Secondary | ICD-10-CM

## 2017-09-22 DIAGNOSIS — T502X5A Adverse effect of carbonic-anhydrase inhibitors, benzothiadiazides and other diuretics, initial encounter: Secondary | ICD-10-CM | POA: Diagnosis present

## 2017-09-22 DIAGNOSIS — Z880 Allergy status to penicillin: Secondary | ICD-10-CM

## 2017-09-22 DIAGNOSIS — R42 Dizziness and giddiness: Secondary | ICD-10-CM | POA: Diagnosis present

## 2017-09-22 HISTORY — DX: Hyperlipidemia, unspecified: E78.5

## 2017-09-22 HISTORY — DX: Iron deficiency anemia, unspecified: D50.9

## 2017-09-22 LAB — COMPREHENSIVE METABOLIC PANEL
ALK PHOS: 77 U/L (ref 38–126)
ALT: 33 U/L (ref 14–54)
AST: 33 U/L (ref 15–41)
Albumin: 3.2 g/dL — ABNORMAL LOW (ref 3.5–5.0)
Anion gap: 10 (ref 5–15)
BUN: 30 mg/dL — AB (ref 6–20)
CALCIUM: 8.6 mg/dL — AB (ref 8.9–10.3)
CHLORIDE: 109 mmol/L (ref 101–111)
CO2: 22 mmol/L (ref 22–32)
CREATININE: 1.53 mg/dL — AB (ref 0.44–1.00)
GFR calc non Af Amer: 35 mL/min — ABNORMAL LOW (ref >60)
GFR, EST AFRICAN AMERICAN: 40 mL/min — AB (ref >60)
Glucose, Bld: 117 mg/dL — ABNORMAL HIGH (ref 65–99)
Potassium: 5.4 mmol/L — ABNORMAL HIGH (ref 3.5–5.1)
Sodium: 141 mmol/L (ref 135–145)
Total Bilirubin: 0.5 mg/dL (ref 0.3–1.2)
Total Protein: 7 g/dL (ref 6.5–8.1)

## 2017-09-22 LAB — URINALYSIS, ROUTINE W REFLEX MICROSCOPIC
BACTERIA UA: NONE SEEN
Bilirubin Urine: NEGATIVE
Glucose, UA: NEGATIVE mg/dL
KETONES UR: NEGATIVE mg/dL
Leukocytes, UA: NEGATIVE
Nitrite: NEGATIVE
PH: 5 (ref 5.0–8.0)
Protein, ur: >=300 mg/dL — AB
Specific Gravity, Urine: 1.013 (ref 1.005–1.030)

## 2017-09-22 LAB — CBC WITH DIFFERENTIAL/PLATELET
BASOS ABS: 0.1 10*3/uL (ref 0.0–0.1)
Basophils Relative: 1 %
EOS ABS: 0.1 10*3/uL (ref 0.0–0.7)
EOS PCT: 2 %
HCT: 31.9 % — ABNORMAL LOW (ref 36.0–46.0)
HEMOGLOBIN: 9.7 g/dL — AB (ref 12.0–15.0)
LYMPHS PCT: 20 %
Lymphs Abs: 1.6 10*3/uL (ref 0.7–4.0)
MCH: 23.6 pg — AB (ref 26.0–34.0)
MCHC: 30.4 g/dL (ref 30.0–36.0)
MCV: 77.6 fL — ABNORMAL LOW (ref 78.0–100.0)
Monocytes Absolute: 0.6 10*3/uL (ref 0.1–1.0)
Monocytes Relative: 8 %
NEUTROS PCT: 69 %
Neutro Abs: 5.6 10*3/uL (ref 1.7–7.7)
PLATELETS: 264 10*3/uL (ref 150–400)
RBC: 4.11 MIL/uL (ref 3.87–5.11)
RDW: 17.8 % — ABNORMAL HIGH (ref 11.5–15.5)
WBC: 8 10*3/uL (ref 4.0–10.5)

## 2017-09-22 LAB — BRAIN NATRIURETIC PEPTIDE: B Natriuretic Peptide: 1650.6 pg/mL — ABNORMAL HIGH (ref 0.0–100.0)

## 2017-09-22 LAB — I-STAT CG4 LACTIC ACID, ED: LACTIC ACID, VENOUS: 0.98 mmol/L (ref 0.5–1.9)

## 2017-09-22 LAB — ECHOCARDIOGRAM COMPLETE
HEIGHTINCHES: 61 in
Weight: 3488 oz

## 2017-09-22 LAB — TROPONIN I

## 2017-09-22 LAB — HEMOGLOBIN A1C
Hgb A1c MFr Bld: 7.4 % — ABNORMAL HIGH (ref 4.8–5.6)
Mean Plasma Glucose: 165.68 mg/dL

## 2017-09-22 LAB — LIPASE, BLOOD: Lipase: 22 U/L (ref 11–51)

## 2017-09-22 LAB — GLUCOSE, CAPILLARY
Glucose-Capillary: 125 mg/dL — ABNORMAL HIGH (ref 65–99)
Glucose-Capillary: 141 mg/dL — ABNORMAL HIGH (ref 65–99)

## 2017-09-22 MED ORDER — INSULIN GLARGINE 100 UNIT/ML ~~LOC~~ SOLN
5.0000 [IU] | Freq: Every day | SUBCUTANEOUS | Status: DC
  Administered 2017-09-22 – 2017-09-26 (×5): 5 [IU] via SUBCUTANEOUS
  Filled 2017-09-22 (×6): qty 0.05

## 2017-09-22 MED ORDER — FUROSEMIDE 10 MG/ML IJ SOLN
60.0000 mg | Freq: Two times a day (BID) | INTRAMUSCULAR | Status: DC
  Administered 2017-09-22 – 2017-09-25 (×7): 60 mg via INTRAVENOUS
  Filled 2017-09-22 (×7): qty 6

## 2017-09-22 MED ORDER — IPRATROPIUM-ALBUTEROL 0.5-2.5 (3) MG/3ML IN SOLN
3.0000 mL | Freq: Three times a day (TID) | RESPIRATORY_TRACT | Status: DC
Start: 2017-09-23 — End: 2017-09-27
  Administered 2017-09-23 – 2017-09-27 (×14): 3 mL via RESPIRATORY_TRACT
  Filled 2017-09-22 (×14): qty 3

## 2017-09-22 MED ORDER — LOSARTAN POTASSIUM 50 MG PO TABS
50.0000 mg | ORAL_TABLET | Freq: Every day | ORAL | Status: DC
  Administered 2017-09-22 – 2017-09-26 (×5): 50 mg via ORAL
  Filled 2017-09-22 (×5): qty 1

## 2017-09-22 MED ORDER — SODIUM CHLORIDE 0.9 % IV SOLN: 250.0000 mL | INTRAVENOUS | Status: DC | PRN

## 2017-09-22 MED ORDER — HYDROCODONE-ACETAMINOPHEN 5-325 MG PO TABS
1.0000 | ORAL_TABLET | ORAL | Status: DC | PRN
  Administered 2017-09-22 – 2017-09-27 (×17): 1 via ORAL
  Filled 2017-09-22 (×17): qty 1

## 2017-09-22 MED ORDER — DONEPEZIL HCL 5 MG PO TABS: 5.0000 mg | ORAL_TABLET | Freq: Every day | ORAL | Status: DC

## 2017-09-22 MED ORDER — ACETAMINOPHEN 650 MG RE SUPP: 650.0000 mg | Freq: Four times a day (QID) | RECTAL | Status: DC | PRN

## 2017-09-22 MED ORDER — ENOXAPARIN SODIUM 40 MG/0.4ML ~~LOC~~ SOLN
40.0000 mg | SUBCUTANEOUS | Status: DC
  Administered 2017-09-26: 40 mg via SUBCUTANEOUS
  Filled 2017-09-22 (×4): qty 0.4

## 2017-09-22 MED ORDER — INSULIN ASPART 100 UNIT/ML ~~LOC~~ SOLN
0.0000 [IU] | Freq: Three times a day (TID) | SUBCUTANEOUS | Status: DC
  Administered 2017-09-23: 1 [IU] via SUBCUTANEOUS
  Administered 2017-09-23: 2 [IU] via SUBCUTANEOUS
  Administered 2017-09-24: 1 [IU] via SUBCUTANEOUS
  Administered 2017-09-24: 2 [IU] via SUBCUTANEOUS
  Administered 2017-09-25: 1 [IU] via SUBCUTANEOUS
  Administered 2017-09-26: 2 [IU] via SUBCUTANEOUS
  Administered 2017-09-26 – 2017-09-27 (×2): 1 [IU] via SUBCUTANEOUS

## 2017-09-22 MED ORDER — OXYBUTYNIN CHLORIDE ER 5 MG PO TB24: 10.0000 mg | ORAL_TABLET | Freq: Every day | ORAL | Status: DC

## 2017-09-22 MED ORDER — SODIUM CHLORIDE 0.9 % IV SOLN
1.0000 g | Freq: Three times a day (TID) | INTRAVENOUS | Status: DC
  Administered 2017-09-22 – 2017-09-23 (×3): 1 g via INTRAVENOUS
  Filled 2017-09-22 (×4): qty 1

## 2017-09-22 MED ORDER — ONDANSETRON HCL 4 MG/2ML IJ SOLN
4.0000 mg | Freq: Four times a day (QID) | INTRAMUSCULAR | Status: DC | PRN
  Filled 2017-09-22: qty 2

## 2017-09-22 MED ORDER — SODIUM CHLORIDE 0.9% FLUSH
3.0000 mL | Freq: Two times a day (BID) | INTRAVENOUS | Status: DC
  Administered 2017-09-23 – 2017-09-26 (×5): 3 mL via INTRAVENOUS

## 2017-09-22 MED ORDER — TRIAMCINOLONE ACETONIDE 0.1 % EX CREA: 1.0000 "application " | TOPICAL_CREAM | Freq: Two times a day (BID) | CUTANEOUS | Status: DC

## 2017-09-22 MED ORDER — VANCOMYCIN HCL IN DEXTROSE 1-5 GM/200ML-% IV SOLN
1000.0000 mg | Freq: Once | INTRAVENOUS | Status: AC
  Administered 2017-09-22: 1000 mg via INTRAVENOUS
  Filled 2017-09-22: qty 200

## 2017-09-22 MED ORDER — SENNA 8.6 MG PO TABS
1.0000 | ORAL_TABLET | Freq: Two times a day (BID) | ORAL | Status: DC
  Administered 2017-09-22 – 2017-09-25 (×4): 8.6 mg via ORAL
  Filled 2017-09-22 (×6): qty 1

## 2017-09-22 MED ORDER — GABAPENTIN 300 MG PO CAPS
600.0000 mg | ORAL_CAPSULE | Freq: Three times a day (TID) | ORAL | Status: DC
  Administered 2017-09-22 – 2017-09-23 (×4): 600 mg via ORAL
  Filled 2017-09-22 (×4): qty 2

## 2017-09-22 MED ORDER — LAMOTRIGINE 100 MG PO TABS
100.0000 mg | ORAL_TABLET | Freq: Every day | ORAL | Status: DC
  Administered 2017-09-22 – 2017-09-26 (×5): 100 mg via ORAL
  Filled 2017-09-22 (×5): qty 1

## 2017-09-22 MED ORDER — CLONAZEPAM 0.5 MG PO TABS: 0.5000 mg | ORAL_TABLET | Freq: Two times a day (BID) | ORAL | Status: DC

## 2017-09-22 MED ORDER — DIPHENHYDRAMINE HCL 25 MG PO CAPS
25.0000 mg | ORAL_CAPSULE | Freq: Four times a day (QID) | ORAL | Status: DC | PRN
  Filled 2017-09-22: qty 1

## 2017-09-22 MED ORDER — VANCOMYCIN HCL IN DEXTROSE 750-5 MG/150ML-% IV SOLN
750.0000 mg | INTRAVENOUS | Status: DC
  Administered 2017-09-22: 750 mg via INTRAVENOUS
  Filled 2017-09-22 (×3): qty 150

## 2017-09-22 MED ORDER — CARVEDILOL 3.125 MG PO TABS
3.1250 mg | ORAL_TABLET | Freq: Two times a day (BID) | ORAL | Status: DC
  Administered 2017-09-23 – 2017-09-25 (×4): 3.125 mg via ORAL
  Filled 2017-09-22 (×4): qty 1

## 2017-09-22 MED ORDER — INSULIN ASPART 100 UNIT/ML ~~LOC~~ SOLN
5.0000 [IU] | Freq: Three times a day (TID) | SUBCUTANEOUS | Status: DC
  Administered 2017-09-23 – 2017-09-24 (×4): 5 [IU] via SUBCUTANEOUS

## 2017-09-22 MED ORDER — SODIUM CHLORIDE 0.9 % IV BOLUS
1000.0000 mL | Freq: Once | INTRAVENOUS | Status: AC
  Administered 2017-09-22: 1000 mL via INTRAVENOUS

## 2017-09-22 MED ORDER — LORATADINE 10 MG PO TABS
10.0000 mg | ORAL_TABLET | Freq: Every day | ORAL | Status: DC
  Administered 2017-09-26: 10 mg via ORAL
  Filled 2017-09-22 (×3): qty 1

## 2017-09-22 MED ORDER — SODIUM CHLORIDE 0.9% FLUSH: 3.0000 mL | INTRAVENOUS | Status: DC | PRN

## 2017-09-22 MED ORDER — IPRATROPIUM-ALBUTEROL 0.5-2.5 (3) MG/3ML IN SOLN
3.0000 mL | Freq: Four times a day (QID) | RESPIRATORY_TRACT | Status: DC
  Administered 2017-09-22: 3 mL via RESPIRATORY_TRACT
  Filled 2017-09-22: qty 3

## 2017-09-22 MED ORDER — FAMOTIDINE 20 MG PO TABS
20.0000 mg | ORAL_TABLET | Freq: Two times a day (BID) | ORAL | Status: DC
  Administered 2017-09-22 – 2017-09-23 (×2): 20 mg via ORAL
  Filled 2017-09-22 (×2): qty 1

## 2017-09-22 MED ORDER — FERROUS SULFATE 325 (65 FE) MG PO TABS: 325.0000 mg | ORAL_TABLET | Freq: Every day | ORAL | Status: DC

## 2017-09-22 MED ORDER — DOCUSATE SODIUM 100 MG PO CAPS
100.0000 mg | ORAL_CAPSULE | Freq: Two times a day (BID) | ORAL | Status: DC
  Administered 2017-09-22 – 2017-09-23 (×3): 100 mg via ORAL
  Filled 2017-09-22 (×7): qty 1

## 2017-09-22 MED ORDER — ACETAMINOPHEN 325 MG PO TABS: 650.0000 mg | ORAL_TABLET | Freq: Four times a day (QID) | ORAL | Status: DC | PRN

## 2017-09-22 MED ORDER — CHLORPROMAZINE HCL 25 MG PO TABS
25.0000 mg | ORAL_TABLET | Freq: Every day | ORAL | Status: DC
  Administered 2017-09-22 – 2017-09-25 (×3): 25 mg via ORAL
  Filled 2017-09-22 (×6): qty 1

## 2017-09-22 MED ORDER — ERGOCALCIFEROL 1.25 MG (50000 UT) PO CAPS: 50000.0000 [IU] | ORAL_CAPSULE | ORAL | Status: DC

## 2017-09-22 MED ORDER — LEVOFLOXACIN IN D5W 750 MG/150ML IV SOLN
750.0000 mg | Freq: Once | INTRAVENOUS | Status: AC
  Administered 2017-09-22: 750 mg via INTRAVENOUS
  Filled 2017-09-22: qty 150

## 2017-09-22 MED ORDER — POLYETHYLENE GLYCOL 3350 17 G PO PACK: 17.0000 g | PACK | Freq: Every day | ORAL | Status: DC | PRN

## 2017-09-22 MED ORDER — PRAVASTATIN SODIUM 20 MG PO TABS: 20.0000 mg | ORAL_TABLET | Freq: Every day | ORAL | Status: DC

## 2017-09-22 MED ORDER — SODIUM CHLORIDE 0.9 % IV SOLN
INTRAVENOUS | Status: DC
  Administered 2017-09-22 – 2017-09-23 (×2): via INTRAVENOUS

## 2017-09-22 MED ORDER — ONDANSETRON HCL 4 MG PO TABS: 4.0000 mg | ORAL_TABLET | Freq: Four times a day (QID) | ORAL | Status: DC | PRN

## 2017-09-22 NOTE — ED Provider Notes (Signed)
[name]   CSN: [id number] Arrival date & time: 09/22/17  1118     History   Chief Complaint Chief Complaint  Patient presents with  . Nausea  . Emesis  . Shortness of Breath    HPI Diane [name] is a 67 y.o. female.  67 year old patient presents with several weeks of cough and shortness of breath is worse of the last several days.  Notes dyspnea on exertion as well as cough productive of yellow sputum.  Denies any prior history of pulmonary disease.  Pulse ox in triage was 87% on room air.  She is also noticed some dizziness as well as some increased pedal edema.  No vomiting or diarrhea.  Has had congestion that is in her epigastric area leg is worse when she coughs.  No treatment used prior to arrival.     Past Medical History:  Diagnosis Date  . Anxiety   . Arthritis    "everywhere" (04/26/2016)  . Childhood asthma   . Chronic lower back pain   . Claustrophobia   . Complication of anesthesia    "had metallic taste in my mouth for a year after one of my ORs; next OR they changed some stuff; no problems since" (04/26/2016)  . Depression   . Diabetic polyneuropathy ([place])   . Family history of adverse reaction to anesthesia    "mom; don't have an idea what it was" (04/26/2016)  . Fibromyalgia   . GERD (gastroesophageal reflux disease)   . Headache    "related to stress" (04/26/2016)  . Hypertension   . Manic, depressive ([place])    "severe" (04/26/2016)  . Pneumonia    "long time ago" (04/26/2016)  . Sickle cell trait ([place])   . Sleep apnea    "mask ordered; too claustrophobic to wear" (04/26/2016)  . Type II diabetes mellitus [facility])     Patient Active Problem List   Diagnosis Date Noted  . Polyarthralgia 09/04/2017  . Pre-ulcerative corn or callous 09/04/2017  . Cellulitis 07/24/2017  . Pain in left leg 04/20/2017  . Charcot foot due to diabetes mellitus ([place]) 02/21/2017  . Unilateral primary  osteoarthritis, right knee 09/07/2016  . Pain in right hip 09/07/2016  . Vitamin D deficiency 07/25/2016  . Diabetic ulcer of right heel associated with type 2 diabetes mellitus, limited to breakdown of skin ([place]) 05/12/2016  . Osteomyelitis of ankle or foot   . Diabetic ulcer of left heel associated with type 2 diabetes mellitus, with bone involvement without evidence of necrosis ([place])   . Right foot ulcer, limited to breakdown of skin ([place]) 04/26/2016  . Hypertension 04/26/2016  . DM (diabetes mellitus) type II controlled with renal manifestation ([place]) 04/26/2016  . Fibromyalgia 04/26/2016  . Diabetic polyneuropathy ([place]) 04/26/2016    Past Surgical History:  Procedure Laterality Date  . ABDOMINAL HYSTERECTOMY     "partial"  . APPENDECTOMY    . CALCANEAL OSTEOTOMY Left 04/28/2016   Procedure: CALCANEAL OSTEOTOMY;  Surgeon: Newt Minion, MD;  Location: [facility];  Service: Orthopedics;  Laterality: Left;  . CATARACT EXTRACTION W/ INTRAOCULAR LENS  IMPLANT, BILATERAL Bilateral   . FOOT SURGERY Left 2015  . FOOT SURGERY Right 04/2014   subtalar joint arthrodesis, talonavicualr joint arthrodesis, hammertoe repair 2,4,5 right foot  . FRACTURE SURGERY    . LAPAROSCOPIC CHOLECYSTECTOMY    . OPEN REDUCTION INTERNAL FIXATION (ORIF) TIBIA/FIBULA FRACTURE Left   . TOE AMPUTATION Bilateral    toe hallux   .  TONSILLECTOMY AND ADENOIDECTOMY       OB History   None      Home Medications    Prior to Admission medications   Medication Sig Start Date End Date Taking? Authorizing Provider  acetaminophen (TYLENOL) 325 MG tablet Take 2 tablets (650 mg total) by mouth every 6 (six) hours as needed for mild pain (or Fever >/= 101). 07/28/17   Emokpae, Courage, MD  Blood Glucose Monitoring Suppl (ACCU-CHEK AVIVA PLUS) w/Device KIT Use as directed for 3 times daily testing of blood glucose. E11.9 09/07/16   Maren Reamer, MD  carvedilol (COREG) 3.125 MG tablet Take 1 tablet (3.125 mg total) by  mouth 2 (two) times daily with a meal. 09/04/17   Ladell Pier, MD  cetirizine (ZYRTEC) 10 MG tablet Take 1 tablet (10 mg total) by mouth daily. 06/01/17   Argentina Donovan, PA-C  chlorproMAZINE (THORAZINE) 25 MG tablet Take 1 tablet (25 mg total) by mouth at bedtime. Patient not taking: Reported on 09/20/2017 08/30/17   Arfeen, Arlyce Harman, MD  clindamycin (CLEOCIN) 300 MG capsule Take 1 capsule (300 mg total) by mouth 3 (three) times daily. 07/28/17   Roxan Hockey, MD  clonazePAM (KLONOPIN) 0.5 MG tablet Take 1 tablet (0.5 mg total) by mouth 2 (two) times daily. 08/30/17 08/30/18  Arfeen, Arlyce Harman, MD  diclofenac sodium (VOLTAREN) 1 % GEL Apply 2 g topically 4 (four) times daily. 09/04/17   Ladell Pier, MD  diphenhydrAMINE (BENADRYL) 25 MG tablet Take 25 mg by mouth every 6 (six) hours as needed for itching.     [provider]  donepezil (ARICEPT) 5 MG tablet Take 1 tablet (5 mg total) by mouth at bedtime. 05/13/16   Argentina Donovan, PA-C  ergocalciferol (VITAMIN D2) 50000 units capsule Take 1 capsule (50,000 Units total) by mouth once a week. 05/13/16   Argentina Donovan, PA-C  famotidine (PEPCID) 20 MG tablet Take 1 tablet (20 mg total) by mouth 2 (two) times daily. 09/06/16   Maren Reamer, MD  ferrous sulfate (FERROUSUL) 325 (65 FE) MG tablet Take 1 tablet (325 mg total) by mouth daily with breakfast. 09/06/17   Ladell Pier, MD  furosemide (LASIX) 20 MG tablet Take 1.5 tablets (30 mg total) by mouth daily. 09/06/17   Ladell Pier, MD  gabapentin (NEURONTIN) 300 MG capsule Take 2 capsules (600 mg total) by mouth 3 (three) times daily. 08/07/17   Newt Minion, MD  glucose blood (ACCU-CHEK AVIVA PLUS) test strip Use as instructed for 3 times daily testing of blood glucose. E.11.9 09/26/16   Maren Reamer, MD  HYDROcodone-acetaminophen (NORCO/VICODIN) 5-325 MG tablet Take 1 tablet by mouth every 4 (four) hours as needed for moderate pain. 07/28/17   Roxan Hockey, MD    insulin aspart (NOVOLOG) 100 UNIT/ML FlexPen Inject 1 Units into the skin 3 (three) times daily with meals. Sliding scale; 12 units max per day 09/21/16   [name] Mussel T, MD  Insulin Pen Needle (ULTICARE MICRO PEN NEEDLES) 32G X 4 MM MISC 1 applicator by Does not apply route at bedtime. 09/21/16   Maren Reamer, MD  lactobacillus acidophilus & bulgar (LACTINEX) chewable tablet Chew 1 tablet by mouth 3 (three) times daily with meals. 07/28/17   Roxan Hockey, MD  lamoTRIgine (LAMICTAL) 100 MG tablet Take 1 tablet (100 mg total) by mouth daily. 08/30/17   Arfeen, Arlyce Harman, MD  losartan (COZAAR) 50 MG tablet Take 1 tablet (50 mg  total) by mouth daily. 08/21/17   Ladell Pier, MD  mupirocin ointment Drue Stager) 2 % Apply to any area that appears infected 2X daily 06/01/17   Argentina Donovan, PA-C  ondansetron (ZOFRAN) 4 MG tablet Take 1 tablet (4 mg total) by mouth every 6 (six) hours as needed for nausea. 09/04/17   Ladell Pier, MD  oxybutynin (DITROPAN-XL) 10 MG 24 hr tablet Take 1 tablet (10 mg total) by mouth at bedtime. 09/06/16   Maren Reamer, MD  pravastatin (PRAVACHOL) 20 MG tablet Take 1 tablet (20 mg total) by mouth daily. 09/06/16   Maren Reamer, MD  triamcinolone cream (KENALOG) 0.1 % Apply 1 application topically 2 (two) times daily. 06/01/17   Argentina Donovan, PA-C    Family History History reviewed. No pertinent family history.  Social History Social History   Tobacco Use  . Smoking status: Never Smoker  . Smokeless tobacco: Never Used  Substance Use Topics  . Alcohol use: No  . Drug use: No     Allergies   Erythromycin; Biaxin [clarithromycin]; Feldene [piroxicam]; Nsaids; Tetracyclines & related; Latex; Penicillins; and Tape   Review of Systems Review of Systems  All other systems reviewed and are negative.    Physical Exam Updated Vital Signs BP (!) 169/102 (BP Location: Left Arm)   Pulse 100   Temp 98.2 F (36.8 C) (Oral)   Resp 20    Ht 1.549 m (5' 1" )   Wt 98.9 kg (218 lb)   SpO2 (!) 87% Comment: pt placed on 2L nasal cannula  BMI 41.19 kg/m   Physical Exam  Constitutional: She is oriented to person, place, and time. She appears well-developed and well-nourished.  Non-toxic appearance. No distress.  HENT:  Head: Normocephalic and atraumatic.  Eyes: Pupils are equal, round, and reactive to light. Conjunctivae, EOM and lids are normal.  Neck: Normal range of motion. Neck supple. No tracheal deviation present. No thyroid mass present.  Cardiovascular: Regular rhythm and normal heart sounds. Tachycardia present. Exam reveals no gallop.  No murmur heard. Pulmonary/Chest: Effort normal. No stridor. No respiratory distress. She has decreased breath sounds in the right lower field and the left lower field. She has no wheezes. She has no rhonchi. She has no rales.  Abdominal: Soft. Normal appearance and bowel sounds are normal. She exhibits no distension. There is no tenderness. There is no rebound and no CVA tenderness.  Musculoskeletal: Normal range of motion. She exhibits no edema or tenderness.  Lymphadenopathy:  Bilateral le edema  Neurological: She is alert and oriented to person, place, and time. She has normal strength. No cranial nerve deficit or sensory deficit. GCS eye subscore is 4. GCS verbal subscore is 5. GCS motor subscore is 6.  Skin: Skin is warm and dry. No abrasion and no rash noted.  Psychiatric: She has a normal mood and affect. Her speech is normal and behavior is normal.  Nursing note and vitals reviewed.    ED Treatments / Results  Labs (all labs ordered are listed, but only abnormal results are displayed) Labs Reviewed  URINE CULTURE  CULTURE, BLOOD (ROUTINE X 2)  CULTURE, BLOOD (ROUTINE X 2)  CBC WITH DIFFERENTIAL/PLATELET  COMPREHENSIVE METABOLIC PANEL  LIPASE, BLOOD  URINALYSIS, ROUTINE W REFLEX MICROSCOPIC  TROPONIN I  BRAIN NATRIURETIC PEPTIDE  I-STAT CG4 LACTIC ACID, ED     EKG EKG Interpretation  Date/Time:  Friday Sep 22 2017 11:47:11 EDT Ventricular Rate:  99 PR Interval:  QRS Duration: 155 QT Interval:  382 QTC Calculation: 491 R Axis:   -27 Text Interpretation:  Sinus rhythm Left bundle branch block Baseline wander in lead(s) V2 No significant change since last tracing Confirmed by Lacretia Leigh ([id number]) on 09/22/2017 12:58:16 PM   Radiology Dg Chest 2 View  Result Date: 09/22/2017 CLINICAL DATA:  Short of breath EXAM: CHEST - 2 VIEW COMPARISON:  None. FINDINGS: Mild cardiac enlargement without heart failure or edema Left lower lobe infiltrate and small left effusion, suspicious for pneumonia. No acute skeletal lesion IMPRESSION: Left lower lobe infiltrate and small left effusion, suspicious for pneumonia Electronically Signed   By: Franchot Gallo M.D.   On: 09/22/2017 12:43    Procedures Procedures (including critical care time)  Medications Ordered in ED Medications  sodium chloride 0.9 % bolus 1,000 mL (has no administration in time range)  0.9 %  sodium chloride infusion (has no administration in time range)     Initial Impression / Assessment and Plan / ED Course  I have reviewed the triage vital signs and the nursing notes.  Pertinent labs & imaging results that were available during my care of the patient were reviewed by me and considered in my medical decision making (see chart for details).     Patient with evidence of pneumonia on chest x-ray.  Patient started on IV antibiotics and will be admitted to the hospitalist due to oxygen requirement  Final Clinical Impressions(s) / ED Diagnoses   Final diagnoses:  None    ED Discharge Orders    None       Lacretia Leigh, MD 09/22/17 1406

## 2017-09-22 NOTE — ED Triage Notes (Addendum)
Patient reports that she has had SOB x several weeks. Patient states she has a productive cough with yellow/green sputum. Room air sats 87% in triage. Patient also c/o dizziness. Patient stated, "Nobody believes me."

## 2017-09-22 NOTE — Progress Notes (Signed)
Pharmacy Antibiotic Note  Diane [name] is a 67 y.o. female admitted on 09/22/2017 with pneumonia.   Pharmacy has been consulted for Vancomycin dosing.  MD dosing Aztreonam.   PCN allergy noted.  Patient has tolerated cephalosporins in the past (cefazolin, cefepime).    09/22/2017:  Afebrile since presentation, noted low-grade fevers at PCP office  WBC, LA WNL  Scr 1.53, est CrCl ~1ml/min  5/17 CXR + LLL PNA  Plan: Vancomycin  IV q24h- start now for adequate loading dose Target AUC 400-500 Monitor renal function and cx data  Consider check MRSA PCR Aztreonam per MD- consider change to Cefepime  Height:  (154.9 cm) Weight: 218 lb (98.9 kg) IBW/kg (Calculated) : 47.8  Temp (24hrs), Avg:98.2 F (36.8 C), Min:98.2 F (36.8 C), Max:98.2 F (36.8 C)  Recent Labs  Lab 09/22/17 1322 09/22/17 1326  WBC 8.0  --   CREATININE 1.53*  --   LATICACIDVEN  --  0.98    Estimated Creatinine Clearance: 39.5 mL/min (A) (by C-G formula based on SCr of 1.53 mg/dL (H)).    Allergies  Allergen Reactions  . Erythromycin Anaphylaxis  . Biaxin [Clarithromycin] Other (See Comments)  . Feldene [Piroxicam] Diarrhea, Nausea And Vomiting and Other (See Comments)    And sores in mouth  . Nsaids Other (See Comments)    Contraindicated because of her other meds  . Tetracyclines & Related   . Latex Rash  . Penicillins Rash and Other (See Comments)    Has patient had a PCN reaction causing immediate rash, facial/tongue/throat swelling, SOB or lightheadedness with hypotension: Yes Has patient had a PCN reaction causing severe rash involving mucus membranes or skin necrosis: No Has patient had a PCN reaction that required hospitalization No Has patient had a PCN reaction occurring within the last 10 years: No If all of the above answers are "NO", then may proceed with Cephalosporin use. And sores in her mouth   . Tape Rash    Antimicrobials this admission: 5/17 Vanc >>  5/17  Levaquin x1 in ED 5/17 Aztreonam>>   Dose adjustments this admission:  Microbiology results: 5/17 BCx:  5/17 UCx:   MRSA PCR:   Thank you for allowing pharmacy to be a part of this patient's care.  Elson Clan 09/22/2017 4:05 PM

## 2017-09-22 NOTE — H&P (Addendum)
History and Physical    Diane [name] ZDG:[id number] DOB: Nov 16, 1950 DOA: 09/22/2017  PCP: Ladell Pier, MD   Patient coming from: home   Chief Complaint: SOB  HPI: Diane [name] is a 67 y.o. female with medical history significant of stage III CKD, hypertension, hyperlipidemia, type 2 diabetes on insulin, chronic lung disease of unclear etiology (PFT on 09/05/2017 she was FEV1 of 61, ,, ratio of 83, DLCO 42) iron deficiency anemia, neuropathy, complicated by Charcot joints, chronic heart failure (unknown if systolic or diastolic), memory problems who comes in with shortness of breath and is found to have left lower lobe pneumonia.  Patient reports that for the past month she has been getting progressively more short of breath.  She reports that she is noticed increased dyspnea on exertion as well as worsening lower extremity swelling.  The symptoms appear to accelerate for the past week where she began to develop fairly significant shortness of breath even at rest.  She also noted subjective fevers and at her primary care doctor's office noted low-grade temperatures.  She endorsed chills.  She reports she has been coughing up productive sputum.  She denies any chest pain however has noted chest tightness.  She also noted worsening orthopnea and paroxysmal nocturnal dyspnea.  She denies any syncope or presyncope.  For the last day she has had episodes of nonbilious nonbloody emesis and is unable to tolerate any p.o.  She denies any abdominal pain, congestion, rhinorrhea.  She denies any rash.  ED Course: In the ED patient's vitals were notable for hypertension and mild tachycardia.  Patient required 2 L nasal cannula due to hypoxia.  Labs are notable for chronic anemia with hemoglobin of 9.7.  Creatinine was 1.53 which is around her baseline.  Chest x-ray showed pneumonia.  Patient was given IV levofloxacin.  Review of Systems: As per HPI otherwise 10 point review of systems negative.     Past Medical History:  Diagnosis Date  . Anxiety   . Arthritis    "everywhere" (04/26/2016)  . Childhood asthma   . Chronic lower back pain   . Claustrophobia   . Complication of anesthesia    "had metallic taste in my mouth for a year after one of my ORs; next OR they changed some stuff; no problems since" (04/26/2016)  . Depression   . Diabetic polyneuropathy ([place])   . Family history of adverse reaction to anesthesia    "mom; don't have an idea what it was" (04/26/2016)  . Fibromyalgia   . GERD (gastroesophageal reflux disease)   . Headache    "related to stress" (04/26/2016)  . HLD (hyperlipidemia) 09/22/2017  . Hypertension   . Iron deficiency anemia 09/22/2017  . Manic, depressive ([place])    "severe" (04/26/2016)  . Pneumonia    "long time ago" (04/26/2016)  . Sickle cell trait ([place])   . Sleep apnea    "mask ordered; too claustrophobic to wear" (04/26/2016)  . Type II diabetes mellitus ([place])     Past Surgical History:  Procedure Laterality Date  . ABDOMINAL HYSTERECTOMY     "partial"  . APPENDECTOMY    . CALCANEAL OSTEOTOMY Left 04/28/2016   Procedure: CALCANEAL OSTEOTOMY;  Surgeon: Newt Minion, MD;  Location: [facility];  Service: Orthopedics;  Laterality: Left;  . CATARACT EXTRACTION W/ INTRAOCULAR LENS  IMPLANT, BILATERAL Bilateral   . FOOT SURGERY Left 2015  . FOOT SURGERY Right 04/2014   subtalar joint arthrodesis, talonavicualr joint arthrodesis, hammertoe repair  2,4,5 right foot  . FRACTURE SURGERY    . LAPAROSCOPIC CHOLECYSTECTOMY    . OPEN REDUCTION INTERNAL FIXATION (ORIF) TIBIA/FIBULA FRACTURE Left   . TOE AMPUTATION Bilateral    toe hallux   . TONSILLECTOMY AND ADENOIDECTOMY       reports that she has never smoked. She has never used smokeless tobacco. She reports that she does not drink alcohol or use drugs.  Allergies  Allergen Reactions  . Erythromycin Anaphylaxis  . Biaxin [Clarithromycin] Other (See Comments)  . Feldene [Piroxicam] Diarrhea,  Nausea And Vomiting and Other (See Comments)    And sores in mouth  . Nsaids Other (See Comments)    Contraindicated because of her other meds  . Tetracyclines & Related   . Latex Rash  . Penicillins Rash and Other (See Comments)    Has patient had a PCN reaction causing immediate rash, facial/tongue/throat swelling, SOB or lightheadedness with hypotension: Yes Has patient had a PCN reaction causing severe rash involving mucus membranes or skin necrosis: No Has patient had a PCN reaction that required hospitalization No Has patient had a PCN reaction occurring within the last 10 years: No If all of the above answers are "NO", then may proceed with Cephalosporin use. And sores in her mouth   . Tape Rash    History reviewed. No pertinent family history.   Prior to Admission medications   Medication Sig Start Date End Date Taking? Authorizing Provider  acetaminophen (TYLENOL) 325 MG tablet Take 2 tablets (650 mg total) by mouth every 6 (six) hours as needed for mild pain (or Fever >/= 101). 07/28/17   Emokpae, Courage, MD  Blood Glucose Monitoring Suppl (ACCU-CHEK AVIVA PLUS) w/Device KIT Use as directed for 3 times daily testing of blood glucose. E11.9 09/07/16   Maren Reamer, MD  carvedilol (COREG) 3.125 MG tablet Take 1 tablet (3.125 mg total) by mouth 2 (two) times daily with a meal. 09/04/17   Ladell Pier, MD  cetirizine (ZYRTEC) 10 MG tablet Take 1 tablet (10 mg total) by mouth daily. 06/01/17   Argentina Donovan, PA-C  chlorproMAZINE (THORAZINE) 25 MG tablet Take 1 tablet (25 mg total) by mouth at bedtime. Patient not taking: Reported on 09/20/2017 08/30/17   Arfeen, Arlyce Harman, MD  clindamycin (CLEOCIN) 300 MG capsule Take 1 capsule (300 mg total) by mouth 3 (three) times daily. 07/28/17   Roxan Hockey, MD  clonazePAM (KLONOPIN) 0.5 MG tablet Take 1 tablet (0.5 mg total) by mouth 2 (two) times daily. 08/30/17 08/30/18  Arfeen, Arlyce Harman, MD  diclofenac sodium (VOLTAREN) 1 % GEL  Apply 2 g topically 4 (four) times daily. 09/04/17   Ladell Pier, MD  diphenhydrAMINE (BENADRYL) 25 MG tablet Take 25 mg by mouth every 6 (six) hours as needed for itching.     [provider]  donepezil (ARICEPT) 5 MG tablet Take 1 tablet (5 mg total) by mouth at bedtime. 05/13/16   Argentina Donovan, PA-C  ergocalciferol (VITAMIN D2) 50000 units capsule Take 1 capsule (50,000 Units total) by mouth once a week. 05/13/16   Argentina Donovan, PA-C  famotidine (PEPCID) 20 MG tablet Take 1 tablet (20 mg total) by mouth 2 (two) times daily. 09/06/16   Maren Reamer, MD  ferrous sulfate (FERROUSUL) 325 (65 FE) MG tablet Take 1 tablet (325 mg total) by mouth daily with breakfast. 09/06/17   Ladell Pier, MD  furosemide (LASIX) 20 MG tablet Take 1.5 tablets (30 mg total)  by mouth daily. 09/06/17   Ladell Pier, MD  gabapentin (NEURONTIN) 300 MG capsule Take 2 capsules (600 mg total) by mouth 3 (three) times daily. 08/07/17   Newt Minion, MD  glucose blood (ACCU-CHEK AVIVA PLUS) test strip Use as instructed for 3 times daily testing of blood glucose. E.11.9 09/26/16   Maren Reamer, MD  HYDROcodone-acetaminophen (NORCO/VICODIN) 5-325 MG tablet Take 1 tablet by mouth every 4 (four) hours as needed for moderate pain. 07/28/17   Roxan Hockey, MD  insulin aspart (NOVOLOG) 100 UNIT/ML FlexPen Inject 1 Units into the skin 3 (three) times daily with meals. Sliding scale; 12 units max per day 09/21/16   [name] Mussel T, MD  Insulin Pen Needle (ULTICARE MICRO PEN NEEDLES) 32G X 4 MM MISC 1 applicator by Does not apply route at bedtime. 09/21/16   Maren Reamer, MD  lactobacillus acidophilus & bulgar (LACTINEX) chewable tablet Chew 1 tablet by mouth 3 (three) times daily with meals. 07/28/17   Roxan Hockey, MD  lamoTRIgine (LAMICTAL) 100 MG tablet Take 1 tablet (100 mg total) by mouth daily. 08/30/17   Arfeen, Arlyce Harman, MD  losartan (COZAAR) 50 MG tablet Take 1 tablet (50 mg total) by  mouth daily. 08/21/17   Ladell Pier, MD  mupirocin ointment Drue Stager) 2 % Apply to any area that appears infected 2X daily 06/01/17   Argentina Donovan, PA-C  ondansetron (ZOFRAN) 4 MG tablet Take 1 tablet (4 mg total) by mouth every 6 (six) hours as needed for nausea. 09/04/17   Ladell Pier, MD  oxybutynin (DITROPAN-XL) 10 MG 24 hr tablet Take 1 tablet (10 mg total) by mouth at bedtime. 09/06/16   Maren Reamer, MD  pravastatin (PRAVACHOL) 20 MG tablet Take 1 tablet (20 mg total) by mouth daily. 09/06/16   Maren Reamer, MD  triamcinolone cream (KENALOG) 0.1 % Apply 1 application topically 2 (two) times daily. 06/01/17   Argentina Donovan, PA-C    Physical Exam: Vitals:   09/22/17 1130 09/22/17 1156 09/22/17 1201 09/22/17 1330  BP:  (!) 169/102  (!) 162/103  Pulse:  100  (!) 103  Resp:  20  (!) 26  Temp:  98.2 F (36.8 C)    TempSrc:  Oral    SpO2: 93% (!) 87%  95%  Weight:   98.9 kg (218 lb)   Height:   _0  (1.549 m)     Constitutional: NAD, calm, comfortable Vitals:   09/22/17 1130 09/22/17 1156 09/22/17 1201 09/22/17 1330  BP:  (!) 169/102  (!) 162/103  Pulse:  100  (!) 103  Resp:  20  (!) 26  Temp:  98.2 F (36.8 C)    TempSrc:  Oral    SpO2: 93% (!) 87%  95%  Weight:   98.9 kg (218 lb)   Height:   _1  (1.549 m)    Eyes: Anicteric sclera ENMT: Mucous membranes, poor dentition.  Neck: [place] Respiratory: Moderately increased work of breathing, diffuse wheezing throughout bilateral lung fields, crackles noted at bilateral bases worse on the left, scattered rhonchi throughout Cardiovascular: Tachycardia, regular rhythm, no murmurs Abdomen: Soft, nondistended, plus bowel sounds, no rebound or guarding musculoskeletal: Charcot joints noted, 2+ lower extremity edema.  Skin: Bilateral lower extremity chronic skin changes, no acute rash Neurologic: Grossly intact, moving all extremities Psychiatric: Normal judgment and insight. Alert and oriented x 3.  Normal mood.    Labs on Admission: I have personally reviewed following labs  and imaging studies  CBC: Recent Labs  Lab 09/22/17 1322  WBC 8.0  NEUTROABS 5.6  HGB 9.7*  HCT 31.9*  MCV 77.6*  PLT 979   Basic Metabolic Panel: Recent Labs  Lab 09/22/17 1322  NA 141  K 5.4*  CL 109  CO2 22  GLUCOSE 117*  BUN 30*  CREATININE 1.53*  CALCIUM 8.6*   GFR: Estimated Creatinine Clearance: 39.5 mL/min (A) (by C-G formula based on SCr of 1.53 mg/dL (H)). Liver Function Tests: Recent Labs  Lab 09/22/17 1322  AST 33  ALT 33  ALKPHOS 77  BILITOT 0.5  PROT 7.0  ALBUMIN 3.2*   Recent Labs  Lab 09/22/17 1322  LIPASE 22   No results for input(s): AMMONIA in the last 168 hours. Coagulation Profile: No results for input(s): INR, PROTIME in the last 168 hours. Cardiac Enzymes: Recent Labs  Lab 09/22/17 1322  TROPONINI <0.03   BNP (last 3 results) No results for input(s): PROBNP in the last 8760 hours. HbA1C: No results for input(s): HGBA1C in the last 72 hours. CBG: No results for input(s): GLUCAP in the last 168 hours. Lipid Profile: No results for input(s): CHOL, HDL, LDLCALC, TRIG, CHOLHDL, LDLDIRECT in the last 72 hours. Thyroid Function Tests: No results for input(s): TSH, T4TOTAL, FREET4, T3FREE, THYROIDAB in the last 72 hours. Anemia Panel: No results for input(s): VITAMINB12, FOLATE, FERRITIN, TIBC, IRON, RETICCTPCT in the last 72 hours. Urine analysis:    Component Value Date/Time   COLORURINE YELLOW 09/22/2017 [place] 09/22/2017 1248   LABSPEC 1.013 09/22/2017 1248   PHURINE 5.0 09/22/2017 1248   GLUCOSEU NEGATIVE 09/22/2017 1248   HGBUR SMALL (A) 09/22/2017 1248   BILIRUBINUR NEGATIVE 09/22/2017 1248   BILIRUBINUR negative 05/31/2016 1401   KETONESUR NEGATIVE 09/22/2017 1248   PROTEINUR >=300 (A) 09/22/2017 1248   UROBILINOGEN 0.2 05/31/2016 1401   NITRITE NEGATIVE 09/22/2017 1248   LEUKOCYTESUR NEGATIVE 09/22/2017 1248     Radiological Exams on Admission: Dg Chest 2 View  Result Date: 09/22/2017 CLINICAL DATA:  Short of breath EXAM: CHEST - 2 VIEW COMPARISON:  None. FINDINGS: Mild cardiac enlargement without heart failure or edema Left lower lobe infiltrate and small left effusion, suspicious for pneumonia. No acute skeletal lesion IMPRESSION: Left lower lobe infiltrate and small left effusion, suspicious for pneumonia Electronically Signed   By: Franchot Gallo M.D.   On: 09/22/2017 12:43    EKG: Independently reviewed.  Left bundle branch block, inverted T waves in V5 and V6, no acute ST segment changes, no prior to compare  Assessment/Plan Principal Problem:   HCAP (healthcare-associated pneumonia) Active Problems:   Hypertension   DM (diabetes mellitus) type II controlled with renal manifestation (HCC)   Diabetic polyneuropathy (HCC)   Charcot foot due to diabetes mellitus ([place])   HLD (hyperlipidemia)   Iron deficiency anemia   #) Acute hypoxic respiratory failure: Unclear if this is related to Healthcare acquired pneumonia (patient was hospitalized in March 2019 for possible cellulitis near Charcot joint) versus exacerbation of obstructive lung disease as patient is wheezing quite a bit versus heart failure (either systolic or diastolic) she does endorse worsening lower extremity edema and orthopnea.  Suspect at least it is some combination of multiple medical problems ongoing. - Start cefepime and vancomycin -Follow blood cultures drawn 09/22/2017 -Echo ordered and pending -IV furosemide 60 mg twice daily -Strict ins and outs, 2 g sodium restricted diet, weigh daily, 2 L fluid restriction - Every 4 hour scheduled  bronchodilators  #) Chronic lung disease: Unclear etiology however appears to be a component of both obstructive and restrictive as her total lung capacity is down however her FEV1 is low relative to her FVC.  Her DLCO is also quite low.  It appears that this restrictive component  predominates and could be related to body habitus. -Bronc dilators per above  #) Type 2 diabetes on insulin: - Carb restricted diet - Continue aspart 5 units with meals and 5 units glargine nightly -Sliding scale insulin, AC at bedtime  #) Hypertension: -Continue losartan 50 mg daily - Hold oral furosemide 30 mg daily - Continue carvedilol 3.125 mg twice daily  #) Iron deficiency anemia: Patient is pending outpatient GI evaluation -Continue iron sulfate 325 daily  #) Pain/psych: -Continue lamotrigine 100 mg daily -Continue gabapentin 300 mg 3 times daily -Continue donepezil 5 mg nightly - Continue PRN Norco -Continue chlorpromazine 25 mg nightly -Continue clonazepam 0.5 mg twice daily  #) Bladder spasms: -Continue oxybutynin 10 mg nightly  Fluids: Restrict Elect lites: Monitor and supplement Nutrition: Carb restricted renal diet, 2 g sodium restricted, 2 L fluid restricted diet  Prophylaxis: Heparin  Disposition: Pending improved respiratory status  Full code     Cristy Folks MD [facility]   If 7PM-7AM, please contact night-coverage [email or web address] Password [facility]  09/22/2017, 2:29 PM

## 2017-09-22 NOTE — Progress Notes (Signed)
  Echocardiogram 2D Echocardiogram has been performed.  Leta Jungling M 09/22/2017, 3:20 PM

## 2017-09-22 NOTE — Progress Notes (Signed)
A consult was received from an ED physician for levaquin/vanc per pharmacy dosing.  The patient's profile has been reviewed for ht/wt/allergies/indication/available labs.   A one time order has been placed for vanc 1g and Levaquin .  Further antibiotics/pharmacy consults should be ordered by admitting physician if indicated.                       Thank you, Berkley Harvey 09/22/2017  1:02 PM

## 2017-09-22 NOTE — ED Triage Notes (Signed)
Per EMS- Patient reports N/V x 2 weeks. Patient reported that she has been to her PCP and was prescribed "nausea medicine , but it does not work." patient states she has vomited x 10 today.

## 2017-09-22 NOTE — Progress Notes (Signed)
Pt BP 159/104, HR 99. MD notified. Will continue to monitor.

## 2017-09-22 NOTE — ED Notes (Signed)
Pt transported to xray 

## 2017-09-22 NOTE — ED Notes (Signed)
ED TO INPATIENT HANDOFF REPORT  Name/Age/Gender Diane [name] 67 y.o. female  Code Status Code Status History    Date Active Date Inactive Code Status Order ID Comments User Context   07/25/2017 0107 07/28/2017 1529 Full Code [id number]  Merton Border, MD Inpatient   04/26/2016 0732 04/29/2016 2255 Full Code [id number]  Dellia Nims, MD ED      Home/SNF/Other Home  Chief Complaint Nausea; Emesis  Level of Care/Admitting Diagnosis ED Disposition    ED Disposition Condition [place]: [facility] [100102]  Level of Care: Telemetry [5]  Admit to tele based on following criteria: Acute CHF  Diagnosis: Acute hypoxemic respiratory failure [facility]) [[id number]]  Admitting Physician: Cristy Folks [[id number]]  Attending Physician: Cristy Folks [phone]  Estimated length of stay: past midnight tomorrow  Certification:: I certify this patient will need inpatient services for at least 2 midnights  PT Class (Do Not Modify): Inpatient [101]  PT Acc Code (Do Not Modify): Private [1]       Medical History Past Medical History:  Diagnosis Date  . Anxiety   . Arthritis    "everywhere" (04/26/2016)  . Childhood asthma   . Chronic lower back pain   . Claustrophobia   . Complication of anesthesia    "had metallic taste in my mouth for a year after one of my ORs; next OR they changed some stuff; no problems since" (04/26/2016)  . Depression   . Diabetic polyneuropathy ([place])   . Family history of adverse reaction to anesthesia    "mom; don't have an idea what it was" (04/26/2016)  . Fibromyalgia   . GERD (gastroesophageal reflux disease)   . Headache    "related to stress" (04/26/2016)  . HLD (hyperlipidemia) 09/22/2017  . Hypertension   . Iron deficiency anemia 09/22/2017  . Manic, depressive ([place])    "severe" (04/26/2016)  . Pneumonia    "long time ago" (04/26/2016)  . Sickle cell trait ([place])   . Sleep apnea    "mask ordered; too  claustrophobic to wear" (04/26/2016)  . Type II diabetes mellitus (HCC)     Allergies Allergies  Allergen Reactions  . Erythromycin Anaphylaxis  . Biaxin [Clarithromycin] Other (See Comments)  . Feldene [Piroxicam] Diarrhea, Nausea And Vomiting and Other (See Comments)    And sores in mouth  . Nsaids Other (See Comments)    Contraindicated because of her other meds  . Tetracyclines & Related   . Latex Rash  . Penicillins Rash and Other (See Comments)    Has patient had a PCN reaction causing immediate rash, facial/tongue/throat swelling, SOB or lightheadedness with hypotension: Yes Has patient had a PCN reaction causing severe rash involving mucus membranes or skin necrosis: No Has patient had a PCN reaction that required hospitalization No Has patient had a PCN reaction occurring within the last 10 years: No If all of the above answers are "NO", then may proceed with Cephalosporin use. And sores in her mouth   . Tape Rash    IV Location/Drains/Wounds Patient Lines/Drains/Airways Status   Active Line/Drains/Airways    Name:   Placement date:   Placement time:   Site:   Days:   Peripheral IV 09/22/17 Right Forearm   09/22/17    1322    Forearm   less than 1   Airway   04/28/16    1804     512   Incision (Closed) 04/28/16 Foot Left   04/28/16  1825     512   Wound / Incision (Open or Dehisced) 04/26/16 Diabetic ulcer Foot Right;Left See WOC note   04/26/16    1638    Foot   514          Labs/Imaging Results for orders placed or performed during the hospital encounter of 09/22/17 (from the past 48 hour(s))  Urinalysis, Routine w reflex microscopic     Status: Abnormal   Collection Time: 09/22/17 12:48 PM  Result Value Ref Range   Color, Urine YELLOW YELLOW   APPearance CLEAR CLEAR   Specific Gravity, Urine 1.013 1.005 - 1.030   pH 5.0 5.0 - 8.0   Glucose, UA NEGATIVE NEGATIVE mg/dL   Hgb urine dipstick SMALL (A) NEGATIVE   Bilirubin Urine NEGATIVE NEGATIVE   Ketones,  ur NEGATIVE NEGATIVE mg/dL   Protein, ur >=300 (A) NEGATIVE mg/dL   Nitrite NEGATIVE NEGATIVE   Leukocytes, UA NEGATIVE NEGATIVE   RBC / HPF 0-5 0 - 5 RBC/hpf   WBC, UA 0-5 0 - 5 WBC/hpf   Bacteria, UA NONE SEEN NONE SEEN   Squamous Epithelial / LPF 0-5 0 - 5   Mucus PRESENT     Comment: Performed at [facility], [place] [address] [place], [place] [address]  CBC with Differential/Platelet     Status: Abnormal   Collection Time: 09/22/17  1:22 PM  Result Value Ref Range   WBC 8.0 4.0 - 10.5 K/uL   RBC 4.11 3.87 - 5.11 MIL/uL   Hemoglobin 9.7 (L) 12.0 - 15.0 g/dL   HCT 31.9 (L) 36.0 - 46.0 %   MCV 77.6 (L) 78.0 - 100.0 fL   MCH 23.6 (L) 26.0 - 34.0 pg   MCHC 30.4 30.0 - 36.0 g/dL   RDW 17.8 (H) 11.5 - 15.5 %   Platelets 264 150 - 400 K/uL   Neutrophils Relative % 69 %   Neutro Abs 5.6 1.7 - 7.7 K/uL   Lymphocytes Relative 20 %   Lymphs Abs 1.6 0.7 - 4.0 K/uL   Monocytes Relative 8 %   Monocytes Absolute 0.6 0.1 - 1.0 K/uL   Eosinophils Relative 2 %   Eosinophils Absolute 0.1 0.0 - 0.7 K/uL   Basophils Relative 1 %   Basophils Absolute 0.1 0.0 - 0.1 K/uL    Comment: Performed at [facility], [place] [address], [place], [place] [address]  Comprehensive metabolic panel     Status: Abnormal   Collection Time: 09/22/17  1:22 PM  Result Value Ref Range   Sodium 141 135 - 145 mmol/L   Potassium 5.4 (H) 3.5 - 5.1 mmol/L   Chloride 109 101 - 111 mmol/L   CO2 22 22 - 32 mmol/L   Glucose, Bld 117 (H) 65 - 99 mg/dL   BUN 30 (H) 6 - 20 mg/dL   Creatinine, Ser 1.53 (H) 0.44 - 1.00 mg/dL   Calcium 8.6 (L) 8.9 - 10.3 mg/dL   Total Protein 7.0 6.5 - 8.1 g/dL   Albumin 3.2 (L) 3.5 - 5.0 g/dL   AST 33 15 - 41 U/L   ALT 33 14 - 54 U/L   Alkaline Phosphatase 77 38 - 126 U/L   Total Bilirubin 0.5 0.3 - 1.2 mg/dL   GFR calc non Af Amer 35 (L) >60 mL/min   GFR calc Af Amer 40 (L) >60 mL/min    Comment: (NOTE) The eGFR has been calculated using the CKD EPI  equation. This calculation has  not been validated in all clinical situations. eGFR's persistently <60 mL/min signify possible Chronic Kidney Disease.    Anion gap 10 5 - 15    Comment: Performed at [facility], [place] [address] [place], [place] [address]  Lipase, blood     Status: None   Collection Time: 09/22/17  1:22 PM  Result Value Ref Range   Lipase 22 11 - 51 U/L    Comment: Performed at [facility], [place] [address] [place], [place] [address]  Troponin I     Status: None   Collection Time: 09/22/17  1:22 PM  Result Value Ref Range   Troponin I <0.03 <0.03 ng/mL    Comment: Performed at [facility], [place] [address] [place], [place] [address]  Brain natriuretic peptide     Status: Abnormal   Collection Time: 09/22/17  1:22 PM  Result Value Ref Range   B Natriuretic Peptide 1,650.6 (H) 0.0 - 100.0 pg/mL    Comment: Performed at [facility], [place] [address] [place], [place] [address]  I-Stat CG4 Lactic Acid, ED     Status: None   Collection Time: 09/22/17  1:26 PM  Result Value Ref Range   Lactic Acid, Venous 0.98 0.5 - 1.9 mmol/L   Dg Chest 2 View  Result Date: 09/22/2017 CLINICAL DATA:  Short of breath EXAM: CHEST - 2 VIEW COMPARISON:  None. FINDINGS: Mild cardiac enlargement without heart failure or edema Left lower lobe infiltrate and small left effusion, suspicious for pneumonia. No acute skeletal lesion IMPRESSION: Left lower lobe infiltrate and small left effusion, suspicious for pneumonia Electronically Signed   By: Franchot Gallo M.D.   On: 09/22/2017 12:43    Pending Labs Unresulted Labs (From admission, onward)   Start     Ordered   09/22/17 1259  Culture, blood (Routine X 2) w Reflex to ID Panel  BLOOD CULTURE X 2,   R     09/22/17 1258   09/22/17 1231  Urine Culture  STAT,   STAT     09/22/17 1230   Signed and Held  Basic metabolic panel  Daily,   R     Signed and Held   Signed and  Held  Basic metabolic panel  Tomorrow morning,   R     Signed and Held   Signed and Held  CBC  Tomorrow morning,   R     Signed and Held   Signed and Held  Magnesium  Tomorrow morning,   R     Signed and Held   Signed and Held  Hemoglobin A1c  Once,   R    Comments:  To assess prior glycemic control    Signed and Held      Vitals/Pain Today's Vitals   09/22/17 1156 09/22/17 1201 09/22/17 1330 09/22/17 1530  BP: (!) 169/102  (!) 162/103 (!) 169/114  Pulse: 100  (!) 103 (!) 101  Resp: 20  (!) 26   Temp: 98.2 F (36.8 C)     TempSrc: Oral     SpO2: (!) 87%  95% 99%  Weight:  218 lb (98.9 kg)    Height:  5' 1"  (1.549 m)    PainSc:  0-No pain      Isolation Precautions No active isolations  Medications Medications  0.9 %  sodium chloride infusion (has no administration in time range)  vancomycin (VANCOCIN) IVPB 1000 mg/200 mL premix (1,000 mg Intravenous New Bag/Given 09/22/17  1515)  sodium chloride 0.9 % bolus 1,000 mL (0 mLs Intravenous Stopped 09/22/17 1357)  levofloxacin (LEVAQUIN) IVPB 750 mg (0 mg Intravenous Stopped 09/22/17 1515)    Mobility walks]

## 2017-09-23 DIAGNOSIS — E1142 Type 2 diabetes mellitus with diabetic polyneuropathy: Secondary | ICD-10-CM

## 2017-09-23 DIAGNOSIS — I10 Essential (primary) hypertension: Secondary | ICD-10-CM

## 2017-09-23 DIAGNOSIS — J9601 Acute respiratory failure with hypoxia: Secondary | ICD-10-CM

## 2017-09-23 DIAGNOSIS — E1129 Type 2 diabetes mellitus with other diabetic kidney complication: Secondary | ICD-10-CM

## 2017-09-23 DIAGNOSIS — Z794 Long term (current) use of insulin: Secondary | ICD-10-CM

## 2017-09-23 DIAGNOSIS — I5041 Acute combined systolic (congestive) and diastolic (congestive) heart failure: Secondary | ICD-10-CM

## 2017-09-23 LAB — URINE CULTURE

## 2017-09-23 LAB — BASIC METABOLIC PANEL
Anion gap: 11 (ref 5–15)
CO2: 20 mmol/L — ABNORMAL LOW (ref 22–32)
Calcium: 8.2 mg/dL — ABNORMAL LOW (ref 8.9–10.3)
GFR calc Af Amer: 38 mL/min — ABNORMAL LOW (ref >60)
GFR calc non Af Amer: 33 mL/min — ABNORMAL LOW (ref >60)
Glucose, Bld: 141 mg/dL — ABNORMAL HIGH (ref 65–99)
Potassium: 5.1 mmol/L (ref 3.5–5.1)

## 2017-09-23 LAB — CBC
HCT: 30.3 % — ABNORMAL LOW (ref 36.0–46.0)
Hemoglobin: 9 g/dL — ABNORMAL LOW (ref 12.0–15.0)
MCH: 23.2 pg — ABNORMAL LOW (ref 26.0–34.0)
MCHC: 29.7 g/dL — ABNORMAL LOW (ref 30.0–36.0)
MCV: 78.1 fL (ref 78.0–100.0)
Platelets: 256 K/uL (ref 150–400)
RBC: 3.88 MIL/uL (ref 3.87–5.11)
RDW: 17.8 % — ABNORMAL HIGH (ref 11.5–15.5)
WBC: 7.9 K/uL (ref 4.0–10.5)

## 2017-09-23 LAB — BASIC METABOLIC PANEL WITH GFR
BUN: 31 mg/dL — ABNORMAL HIGH (ref 6–20)
Chloride: 108 mmol/L (ref 101–111)
Creatinine, Ser: 1.6 mg/dL — ABNORMAL HIGH (ref 0.44–1.00)
Sodium: 139 mmol/L (ref 135–145)

## 2017-09-23 LAB — MAGNESIUM: Magnesium: 1.8 mg/dL (ref 1.7–2.4)

## 2017-09-23 LAB — MRSA PCR SCREENING: MRSA by PCR: NEGATIVE

## 2017-09-23 LAB — STREP PNEUMONIAE URINARY ANTIGEN: Strep Pneumo Urinary Antigen: NEGATIVE

## 2017-09-23 MED ORDER — ISOSORB DINITRATE-HYDRALAZINE 20-37.5 MG PO TABS
1.0000 | ORAL_TABLET | Freq: Three times a day (TID) | ORAL | Status: DC
  Administered 2017-09-23 – 2017-09-27 (×11): 1 via ORAL
  Filled 2017-09-23 (×14): qty 1

## 2017-09-23 MED ORDER — FAMOTIDINE 20 MG PO TABS
20.0000 mg | ORAL_TABLET | Freq: Every day | ORAL | Status: DC
  Administered 2017-09-24 – 2017-09-26 (×3): 20 mg via ORAL
  Filled 2017-09-23 (×3): qty 1

## 2017-09-23 MED ORDER — GABAPENTIN 300 MG PO CAPS
600.0000 mg | ORAL_CAPSULE | Freq: Two times a day (BID) | ORAL | Status: DC
  Administered 2017-09-23 – 2017-09-26 (×7): 600 mg via ORAL
  Filled 2017-09-23 (×7): qty 2

## 2017-09-23 MED ORDER — SODIUM CHLORIDE 0.9 % IV SOLN
1.0000 g | INTRAVENOUS | Status: DC
  Administered 2017-09-23 – 2017-09-26 (×4): 1 g via INTRAVENOUS
  Filled 2017-09-23 (×4): qty 1

## 2017-09-23 MED ORDER — CLONAZEPAM 0.5 MG PO TABS
0.5000 mg | ORAL_TABLET | Freq: Two times a day (BID) | ORAL | Status: DC | PRN
Start: 2017-09-23 — End: 2017-09-27
  Administered 2017-09-23 – 2017-09-27 (×8): 0.5 mg via ORAL
  Filled 2017-09-23 (×8): qty 1

## 2017-09-23 NOTE — Progress Notes (Signed)
PROGRESS NOTE  Diane [name] ZOX:[id number] DOB: Feb 23, 1951 DOA: 09/22/2017 PCP: Marcine Matar, MD  HPI/Recap of past 24 hours: HPI from Dr Clearnce Sorrel on 09/22/17 Diane [name] is a 67 y.o. female with medical history significant of stage III CKD, hypertension, hyperlipidemia, type 2 diabetes on insulin, neuropathy, complicated by Charcot joints, presents to the ED c/o worsening SOB/dyspnea, orthopnea, PND, worsening BLE edema for the past month. Recently she reports productive cough of yellowish sputum, subjective fever/chills. In the ED patient required 2 L nasal cannula due to hypoxia. Chest x-ray showed pneumonia. Pt was started on IV AB with IVF boluses. Pt admitted for further management.  Recently admitted for right lower extremity cellulitis in 07/2017.  Today, pt still SOB, coughing. Denies any chest pain/pressure, abdominal pain, N/V/D/C, fever/chills.   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 Plan for possible stress test versus cardiac catheterization at a  later date Strict I's and O's, daily weight  Acute hypoxic respiratory failure Likely multifactorial: HCAP, CHF, pulm HTN Currently saturating well on [place] O2 Continue duo nebs Supplemental oxygen  CKD stage III Baseline creatinine around 1.5-1.6 Monitor closely due to diuretics/ACEi for HF Daily BMP  Anemia of chronic kidney disease Baseline around 9-10, currently at baseline Monitor closely, daily CBC  Diabetes mellitus type 2/peripheral neuropathy A1c 7.4 on 09/2017 Start SSI, novolog TID, glargine Hold home NovoLog FlexPen Continue gabapentin  Hypertension Continue home losartan, Coreg  Hyperlipidemia LDL 90 on 07/2016 Continue home pravastatin  History of bipolar/anxiety Continue lamotrigine, Klonopin, Thorazine     Code Status: Full  Family Communication: None at bedside  Disposition Plan: Recommending SNF   Consultants:  Cardiology  Procedures:  None  Antimicrobials:  IV cefepime  DVT prophylaxis: Lovenox   Objective: Vitals:   09/23/17 0500 09/23/17 0505 09/23/17 0739 09/23/17 1312  BP:  (!) 156/98  (!) 146/88  Pulse:  90  89  Resp:  20  20  Temp:  98 F (36.7 C)  98.5 F (36.9 C)  TempSrc:  Oral  Oral  SpO2:  98% 100% 100%  Weight: 100.7 kg (222 lb)     Height:        Intake/Output Summary (Last 24 hours) at 09/23/2017 1357 Last data filed at 09/23/2017 1236 Gross per 24 hour  Intake 2225.83 ml  Output 2926 ml  Net -700.17 ml   Filed Weights   09/22/17 1201 09/22/17 1721 09/23/17 0500  Weight: 98.9 kg (218 lb) 98.9 kg (218 lb) 100.7 kg (222 lb)    Exam:   General: In mild distress due to dyspnea  Cardiovascular: S1, S2 present  Respiratory: Bibasilar crackles  Abdomen: Soft, obese, nontender, bowel sounds  present  Musculoskeletal: 2+ bilateral pedal edema  Skin: Normal  Psychiatry: Normal mood   Data Reviewed: CBC: Recent Labs  Lab 09/22/17 1322 09/23/17 0457  WBC 8.0 7.9  NEUTROABS 5.6  --   HGB 9.7*  9.0*  HCT 31.9* 30.3*  MCV 77.6* 78.1  PLT 264 256   Basic Metabolic Panel: Recent Labs  Lab 09/22/17 1322 09/23/17 0457  NA 141 139  K 5.4* 5.1  CL 109 108  CO2 22 20*  GLUCOSE 117* 141*  BUN 30* 31*  CREATININE 1.53* 1.60*  CALCIUM 8.6* 8.2*  MG  --  1.8   GFR: Estimated Creatinine Clearance: 38.2 mL/min (A) (by C-G formula based on SCr of 1.6 mg/dL (H)). Liver Function Tests: Recent Labs  Lab 09/22/17 1322  AST 33  ALT 33  ALKPHOS 77  BILITOT 0.5  PROT 7.0  ALBUMIN 3.2*   Recent Labs  Lab 09/22/17 1322  LIPASE 22   No results for input(s): AMMONIA in the last 168 hours. Coagulation Profile: No results for input(s): INR, PROTIME in the last 168 hours. Cardiac Enzymes: Recent Labs  Lab 09/22/17 1322  TROPONINI <0.03   BNP (last 3 results) No results for input(s): PROBNP in the last 8760 hours. HbA1C: Recent Labs    09/22/17 1829  HGBA1C 7.4*   CBG: Recent Labs  Lab 09/22/17 1737 09/22/17 2044  GLUCAP 125* 141*   Lipid Profile: No results for input(s): CHOL, HDL, LDLCALC, TRIG, CHOLHDL, LDLDIRECT in the last 72 hours. Thyroid Function Tests: No results for input(s): TSH, T4TOTAL, FREET4, T3FREE, THYROIDAB in the last 72 hours. Anemia Panel: No results for input(s): VITAMINB12, FOLATE, FERRITIN, TIBC, IRON, RETICCTPCT in the last 72 hours. Urine analysis:    Component Value Date/Time   COLORURINE YELLOW 09/22/2017 1248   APPEARANCEUR CLEAR 09/22/2017 1248   LABSPEC 1.013 09/22/2017 1248   PHURINE 5.0 09/22/2017 1248   GLUCOSEU NEGATIVE 09/22/2017 1248   HGBUR SMALL (A) 09/22/2017 1248   BILIRUBINUR NEGATIVE 09/22/2017 1248   BILIRUBINUR negative 05/31/2016 1401   KETONESUR NEGATIVE 09/22/2017 1248   PROTEINUR >=300 (A) 09/22/2017 1248   UROBILINOGEN 0.2 05/31/2016 1401   NITRITE NEGATIVE 09/22/2017 1248   LEUKOCYTESUR NEGATIVE 09/22/2017 1248   Sepsis Labs: (procalcitonin:4,lacticidven:4)  ) Recent Results (from the  past 240 hour(s))  Urine Culture     Status: Abnormal   Collection Time: 09/22/17 12:48 PM  Result Value Ref Range Status   Specimen Description   Final    URINE, RANDOM Performed at [facility], [address] [place], [place] [address]    Special Requests   Final    NONE Performed at [facility], [address], [place], [place] [address]    Culture (A)  Final    <10,000 COLONIES/mL INSIGNIFICANT GROWTH Performed at [facility], [address], [place], [place] [address]    Report Status 09/23/2017 FINAL  Final  Culture, blood (Routine X 2) w Reflex to ID Panel     Status: None (Preliminary result)   Collection Time: 09/22/17  1:22 PM  Result Value Ref Range Status   Specimen Description   Final    BLOOD RIGHT FOREARM Performed at [facility], [address] [place], [place] [address]    Special Requests   Final    BOTTLES DRAWN AEROBIC AND ANAEROBIC Blood Culture adequate volume Performed at [facility], [address] [place], [place] [address]    Culture  Final    NO GROWTH < 24 HOURS Performed at [facility], [address], [place], [place] [address]    Report Status PENDING  Incomplete  Culture, blood (Routine X 2) w Reflex to ID Panel     Status: None (Preliminary result)   Collection Time: 09/22/17  1:22 PM  Result Value Ref Range Status   Specimen Description   Final    BLOOD LEFT ANTECUBITAL Performed at [facility], [address], [place], [place] [address]    Special Requests   Final    BOTTLES DRAWN AEROBIC AND ANAEROBIC Blood Culture adequate volume Performed at [facility], [address], [place], [place] [address]    Culture   Final    NO GROWTH < 24 HOURS Performed at [facility], [address] [place], [place] [address]    Report Status PENDING  Incomplete  MRSA PCR Screening     Status: None    Collection Time: 09/23/17  8:01 AM  Result Value Ref Range Status   MRSA by PCR NEGATIVE NEGATIVE Final    Comment:        The GeneXpert MRSA Assay (FDA approved for NASAL specimens only), is one component of a comprehensive MRSA colonization surveillance program. It is not intended to diagnose MRSA infection nor to guide or monitor treatment for MRSA infections. Performed at [facility], [address], [place], [place] [address]       Studies: No results found.  Scheduled Meds: . carvedilol  3.125 mg Oral BID WC  . chlorproMAZINE  25 mg Oral QHS  . docusate sodium  100 mg Oral BID  . enoxaparin (LOVENOX) injection  40 mg Subcutaneous Q24H  . famotidine  20 mg Oral BID  . furosemide  60 mg Intravenous BID  . gabapentin  600 mg Oral TID  . insulin aspart  0-9 Units Subcutaneous TID WC  . insulin aspart  5 Units Subcutaneous TID WC  . insulin glargine  5 Units Subcutaneous QHS  . ipratropium-albuterol  3 mL Nebulization TID  . isosorbide-hydrALAZINE  1 tablet Oral TID  . lamoTRIgine  100 mg Oral Daily  . loratadine  10 mg Oral Daily  . losartan  50 mg Oral Daily  . senna  1 tablet Oral BID  . sodium chloride flush  3 mL Intravenous Q12H    Continuous Infusions: . sodium chloride    . aztreonam 1 g (09/23/17 1220)     LOS: 1 day     Briant Cedar, MD [facility]   If 7PM-7AM, please contact night-coverage [email or web address] Password [email or web address] 09/23/2017, 1:57 PM

## 2017-09-23 NOTE — Evaluation (Signed)
Physical Therapy Evaluation Patient Details Name: Diane [name] MRN: [id number] DOB: 09-Sep-1950 Today's Date: 09/23/2017   History of Present Illness  Diane [name]  is a 67 y.o. 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 Pt currently with functional limitations due to the deficits listed below (see PT Problem List).  Pt deconditioned, having difficulty at home with ADLS/amb prior to admission, sats decr to 83% on RA with short distance amb today; may benefit from SNF if agreeable  Pt will benefit from skilled PT to increase their independence and safety with mobility to allow discharge to the venue listed below.       Follow Up Recommendations SNF(vs HHPT if refuses SNF)    Equipment Recommendations  None recommended by PT    Recommendations for Other Services       Precautions / Restrictions Precautions Precautions: Fall      Mobility  Bed Mobility Overal bed mobility: Needs Assistance Bed Mobility: Supine to Sit     Supine to sit: Min assist     General bed mobility comments: assist with trunk to upright, incr time  Transfers Overall transfer level: Needs assistance Equipment used: Rolling walker (2 wheeled) Transfers: Sit to/from Stand Sit to Stand: Min assist         General transfer comment: cues  for hand placement and safety  Ambulation/Gait Ambulation/Gait assistance: Min assist Ambulation Distance (Feet): 25 Feet Assistive device: Rolling walker (2 wheeled) Gait Pattern/deviations: Step-through pattern;Decreased stride length Gait velocity: decr   General Gait Details: cues for RW position, trunk extension; SpO2=83% on RA, incr to 93% when  1L replaced; 2-3/4 DOE  Information systems manager [employer] (Stroke Patients Only)       Balance Overall balance assessment: Needs assistance             Standing balance comment: reliant                              Pertinent Vitals/Pain Pain Assessment: Faces Faces Pain Scale: Hurts little more Pain Location: back and side and feet Pain Descriptors / Indicators: Sore;Shooting Pain Intervention(s): Monitored during session;Repositioned    Home Living Family/patient expects to be discharged to:: Private residence Living Arrangements: Alone Available Help at Discharge: Other (Comment)(has friend prn assists with housework) Type of Home: Apartment Home Access: Level entry     Home Layout: One level Home Equipment: Walker - 4 wheels;Bedside commode;Tub bench      Prior Function Level of Independence: Independent with assistive device(s)         Comments: uses rollator, sponge bathes      Hand Dominance        Extremity/Trunk Assessment   Upper Extremity Assessment Upper Extremity Assessment: Generalized weakness    Lower Extremity Assessment Lower Extremity Assessment: Generalized weakness       Communication   Communication: No difficulties  Cognition Arousal/Alertness: Awake/alert Behavior During Therapy: WFL for tasks assessed/performed Overall Cognitive Status: Within Functional Limits for tasks assessed  General Comments      Exercises     Assessment/Plan    PT Assessment Patient needs continued PT services  PT Problem List Decreased strength;Decreased range of motion;Decreased balance;Decreased mobility;Decreased knowledge of use of DME;Decreased activity tolerance       PT Treatment Interventions DME instruction;Gait training;Functional mobility training;Therapeutic exercise;Patient/family education;Therapeutic activities    PT Goals  (Current goals can be found in the Care Plan section)  Acute Rehab PT Goals Patient Stated Goal: home; I don't really wnat to go to rehab PT Goal Formulation: With patient Time For Goal Achievement: 10/07/17 Potential to Achieve Goals: Fair    Frequency Min 3X/week   Barriers to discharge        Co-evaluation               AM-PAC PT "6 Clicks" Daily Activity  Outcome Measure Difficulty turning over in bed (including adjusting bedclothes, sheets and blankets)?: A Little Difficulty moving from lying on back to sitting on the side of the bed? : Unable Difficulty sitting down on and standing up from a chair with arms (e.g., wheelchair, bedside commode, etc,.)?: Unable Help needed moving to and from a bed to chair (including a wheelchair)?: A Little Help needed walking in hospital room?: A Little Help needed climbing 3-5 steps with a railing? : A Lot 6 Click Score: 13    End of Session   Activity Tolerance: Patient tolerated treatment well Patient left: in chair Nurse Communication: Mobility status PT Visit Diagnosis: Difficulty in walking, not elsewhere classified (R26.2)    Time: 1191-4782 PT Time Calculation (min) (ACUTE ONLY): 24 min   Charges:   PT Evaluation $PT Eval Low Complexity: 1 Low PT Treatments $Gait Training: 8-22 mins   PT G CodesDrucilla Chalet, PT Pager: [phone] 09/23/2017   Drucilla Chalet 09/23/2017, 2:08 PM

## 2017-09-23 NOTE — Progress Notes (Signed)
Pharmacy Antibiotic Note  Diane [name] is a 67 y.o. female admitted on 09/22/2017 with pneumonia.   Pharmacy has been consulted for Vancomycin dosing.  MD dosing Aztreonam.   PCN allergy noted.  Patient has tolerated cephalosporins in the past (cefazolin, cefepime).    09/23/2017:  Afebrile since presentation, noted low-grade fevers at PCP office  WBC, LA WNL  Scr 1.60, est CrCl ~38 ml/min  5/17 CXR + LLL PNA  Plan: - Cefepime 1 gr IV q24h  Height:  (154.9 cm) Weight: 222 lb (100.7 kg) IBW/kg (Calculated) : 47.8  Temp (24hrs), Avg:98.4 F (36.9 C), Min:98 F (36.7 C), Max:99 F (37.2 C)  Recent Labs  Lab 09/22/17 1322 09/22/17 1326 09/23/17 0457  WBC 8.0  --  7.9  CREATININE 1.53*  --  1.60*  LATICACIDVEN  --  0.98  --     Estimated Creatinine Clearance: 38.2 mL/min (A) (by C-G formula based on SCr of 1.6 mg/dL (H)).    Allergies  Allergen Reactions  . Erythromycin Anaphylaxis  . Biaxin [Clarithromycin] Other (See Comments)  . Feldene [Piroxicam] Diarrhea, Nausea And Vomiting and Other (See Comments)    And sores in mouth  . Nsaids Other (See Comments)    Contraindicated because of her other meds  . Tetracyclines & Related   . Latex Rash  . Penicillins Rash and Other (See Comments)    Has patient had a PCN reaction causing immediate rash, facial/tongue/throat swelling, SOB or lightheadedness with hypotension: Yes Has patient had a PCN reaction causing severe rash involving mucus membranes or skin necrosis: No Has patient had a PCN reaction that required hospitalization No Has patient had a PCN reaction occurring within the last 10 years: No If all of the above answers are "NO", then may proceed with Cephalosporin use. And sores in her mouth   . Tape Rash    Antimicrobials this admission: 5/17 Vanc >> 5/18 5/17 Levaquin x1 in ED 5/17 Aztreonam >> 5/18 5/18 cefepime >>   Dose adjustments this admission:  Microbiology results: 5/17 BCx:  NGTD 5/17 UCx: insignificant growth 5/18 MRSA PCR: neg  Thank you for allowing pharmacy to be a part of this patient's care.  Adalberto Cole, PharmD, BCPS Pager [phone] 09/23/2017 3:18 PM

## 2017-09-23 NOTE — Consult Note (Signed)
CARDIOLOGY CONSULT NOTE  Patient ID: Diane [name] MRN: [id number] DOB/AGE: Dec 12, 1950 67 y.o.  Admit date: 09/22/2017 Referring Physician  Thomes Dinning, MD Primary Physician:  Ladell Pier, MD Reason for Consultation  CHF  HPI: Diane [name]  is a 67 y.o. 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  She has chronic leg edema for which she wears support stockings and it is well controlled but she started noticing that leg swelling was getting worse.  She also developed orthopnea and PND.  About a week ago she also started coughing yellowish sputum.  She presented to the hospital and in the ED was found to have pneumonia and also hypertensive.  Echocardiogram was obtained which revealed severe LV systolic dysfunction, hence I was consulted.  She moved from [place] to [place] for better health care, she lives by herself in a home shelter.  Past Medical History:  Diagnosis Date  . Anxiety   . Arthritis    "everywhere" (04/26/2016)  . Childhood asthma   . Chronic lower back pain   . Claustrophobia   . Complication of anesthesia    "had metallic taste in my mouth for a year after one of my ORs; next OR they changed some stuff; no problems since" (04/26/2016)  . Depression   . Diabetic polyneuropathy ([place])   . Family history of adverse reaction to anesthesia    "mom; don't have an idea what it was" (04/26/2016)  . Fibromyalgia   . GERD (gastroesophageal reflux disease)   . Headache    "related to stress" (04/26/2016)  . HLD (hyperlipidemia) 09/22/2017  . Hypertension   . Iron deficiency anemia 09/22/2017  . Manic, depressive ([place])    "severe" (04/26/2016)  . Pneumonia    "long time ago"  (04/26/2016)  . Sickle cell trait ([place])   . Sleep apnea    "mask ordered; too claustrophobic to wear" (04/26/2016)  . Type II diabetes mellitus ([place])      Past Surgical History:  Procedure Laterality Date  . ABDOMINAL HYSTERECTOMY     "partial"  . APPENDECTOMY    . CALCANEAL OSTEOTOMY Left 04/28/2016   Procedure: CALCANEAL OSTEOTOMY;  Surgeon: Newt Minion, MD;  Location: [facility];  Service: Orthopedics;  Laterality: Left;  . CATARACT EXTRACTION W/ INTRAOCULAR LENS  IMPLANT, BILATERAL Bilateral   . FOOT SURGERY Left 2015  . FOOT SURGERY Right 04/2014   subtalar joint arthrodesis, talonavicualr joint arthrodesis, hammertoe repair 2,4,5 right foot  . FRACTURE SURGERY    . LAPAROSCOPIC CHOLECYSTECTOMY    . OPEN REDUCTION INTERNAL FIXATION (ORIF) TIBIA/FIBULA FRACTURE Left   . TOE AMPUTATION Bilateral    toe hallux   . TONSILLECTOMY AND ADENOIDECTOMY       History reviewed. No pertinent family history.  Family history of premature coronary artery disease and her twin sister who has 62 years of age, but was an alcoholic and also smoker.  Social History: Social History   Socioeconomic History  . Marital status: Single    Spouse name: Not on file  . Number of children: Not on file  . Years of education: Not on file  . Highest education level: Not on file  Occupational History  . Not on file  Social Needs  .  Financial resource strain: Not on file  . Food insecurity:    Worry: Not on file    Inability: Not on file  . Transportation needs:    Medical: Not on file    Non-medical: Not on file  Tobacco Use  . Smoking status: Never Smoker  . Smokeless tobacco: Never Used  Substance and Sexual Activity  . Alcohol use: No  . Drug use: No  . Sexual activity: Never  Lifestyle  . Physical activity:    Days per week: Not on file    Minutes per session: Not on file  . Stress: Not on file  Relationships  . Social connections:    Talks on phone: Not on file    Gets together: Not  on file    Attends religious service: Not on file    Active member of club or organization: Not on file    Attends meetings of clubs or organizations: Not on file    Relationship status: Not on file  . Intimate partner violence:    Fear of current or ex partner: Not on file    Emotionally abused: Not on file    Physically abused: Not on file    Forced sexual activity: Not on file  Other Topics Concern  . Not on file  Social History Narrative  . Not on file     Medications Prior to Admission  Medication Sig Dispense Refill Last Dose  . acetaminophen (TYLENOL) 325 MG tablet Take 2 tablets (650 mg total) by mouth every 6 (six) hours as needed for mild pain (or Fever >/= 101). 20 tablet  Past Week at Unknown time  . Blood Glucose Monitoring Suppl (ACCU-CHEK AVIVA PLUS) w/Device KIT Use as directed for 3 times daily testing of blood glucose. E11.9 1 kit 0 Past Month at Unknown time  . carvedilol (COREG) 3.125 MG tablet Take 1 tablet (3.125 mg total) by mouth 2 (two) times daily with a meal. 60 tablet 5 09/21/2017 at Unknown time  . chlorproMAZINE (THORAZINE) 25 MG tablet Take 1 tablet (25 mg total) by mouth at bedtime. 30 tablet 1 09/21/2017 at Unknown time  . furosemide (LASIX) 20 MG tablet Take 1.5 tablets (30 mg total) by mouth daily. 90 tablet 6 09/21/2017 at Unknown time  . gabapentin (NEURONTIN) 300 MG capsule Take 2 capsules (600 mg total) by mouth 3 (three) times daily. 180 capsule 3 09/21/2017 at Unknown time  . glucose blood (ACCU-CHEK AVIVA PLUS) test strip Use as instructed for 3 times daily testing of blood glucose. E.11.9 100 each 5 Past Month at Unknown time  . insulin aspart (NOVOLOG) 100 UNIT/ML FlexPen Inject 1 Units into the skin 3 (three) times daily with meals. Sliding scale; 12 units max per day 3 mL 2 Past Week at Unknown time  . Insulin Pen Needle (ULTICARE MICRO PEN NEEDLES) 32G X 4 MM MISC 1 applicator by Does not apply route at bedtime. 100 each 2 Past Week at Unknown time   . lamoTRIgine (LAMICTAL) 100 MG tablet Take 1 tablet (100 mg total) by mouth daily. 30 tablet 1 09/21/2017 at Unknown time  . losartan (COZAAR) 50 MG tablet Take 1 tablet (50 mg total) by mouth daily. 90 tablet 3 09/21/2017 at Unknown time  . ondansetron (ZOFRAN) 4 MG tablet Take 1 tablet (4 mg total) by mouth every 6 (six) hours as needed for nausea. 20 tablet 0 Past Week at Unknown time  . clonazePAM (KLONOPIN) 0.5 MG tablet Take 1 tablet (0.5  mg total) by mouth 2 (two) times daily. (Patient not taking: Reported on 09/22/2017) 60 tablet 1 Not Taking at Unknown time  . diclofenac sodium (VOLTAREN) 1 % GEL Apply 2 g topically 4 (four) times daily. (Patient not taking: Reported on 09/22/2017) 100 g 1 Not Taking at Unknown time  . donepezil (ARICEPT) 5 MG tablet Take 1 tablet (5 mg total) by mouth at bedtime. (Patient not taking: Reported on 09/22/2017) 30 tablet 5 Not Taking at Unknown time  . ergocalciferol (VITAMIN D2) 50000 units capsule Take 1 capsule (50,000 Units total) by mouth once a week. (Patient not taking: Reported on 09/22/2017) 12 capsule 0 Not Taking at Unknown time  . famotidine (PEPCID) 20 MG tablet Take 1 tablet (20 mg total) by mouth 2 (two) times daily. (Patient not taking: Reported on 09/22/2017) 60 tablet 3 Not Taking at Unknown time  . ferrous sulfate (FERROUSUL) 325 (65 FE) MG tablet Take 1 tablet (325 mg total) by mouth daily with breakfast. (Patient not taking: Reported on 09/22/2017) 90 tablet 3 Not Taking at Unknown time  . HYDROcodone-acetaminophen (NORCO/VICODIN) 5-325 MG tablet Take 1 tablet by mouth every 4 (four) hours as needed for moderate pain. (Patient not taking: Reported on 09/22/2017) 15 tablet 0 Not Taking at Unknown time  . lactobacillus acidophilus & bulgar (LACTINEX) chewable tablet Chew 1 tablet by mouth 3 (three) times daily with meals. (Patient not taking: Reported on 09/22/2017) 30 tablet 0 Not Taking at Unknown time  . mupirocin ointment (BACTROBAN) 2 % Apply to any  area that appears infected 2X daily (Patient not taking: Reported on 09/22/2017) 30 g 1 Not Taking at Unknown time  . oxybutynin (DITROPAN-XL) 10 MG 24 hr tablet Take 1 tablet (10 mg total) by mouth at bedtime. (Patient not taking: Reported on 09/22/2017) 90 tablet 3 Not Taking at Unknown time  . pravastatin (PRAVACHOL) 20 MG tablet Take 1 tablet (20 mg total) by mouth daily. (Patient not taking: Reported on 09/22/2017) 90 tablet 3 Not Taking at Unknown time  . triamcinolone cream (KENALOG) 0.1 % Apply 1 application topically 2 (two) times daily. (Patient not taking: Reported on 09/22/2017) 30 g 0 Not Taking at Unknown time   Review of Systems  Constitutional: Positive for malaise/fatigue. Negative for chills, diaphoresis, fever and weight loss.  HENT: Negative.   Eyes: Negative.   Respiratory: Positive for cough, sputum production, shortness of breath and wheezing. Negative for hemoptysis.   Cardiovascular: Positive for orthopnea, leg swelling and PND. Negative for chest pain, palpitations and claudication.  Gastrointestinal: Negative.   Genitourinary: Negative.   Musculoskeletal: Positive for joint pain.  Skin: Negative.   Neurological: Positive for dizziness (Vertigo for the past 1 month).  Endo/Heme/Allergies: Negative.   Psychiatric/Behavioral: Negative.   All other systems reviewed and are negative.   Physical Exam: Blood pressure (!) 156/98, pulse 90, temperature 98 F (36.7 C), temperature source Oral, resp. rate 20, height 5' 1"  (1.549 m), weight 100.7 kg (222 lb), SpO2 100 %.   Physical Exam  Constitutional: She is oriented to person, place, and time.  Moderately built, moderately obese, in mild respiratory distress.  HENT:  Head: Atraumatic.  Eyes: Conjunctivae are normal.  Neck: Neck supple. JVD present.  Cardiovascular: Normal rate and regular rhythm. Exam reveals gallop (S3 ).  Bilateral carotids normal, femoral pulses faint due to obesity, absent popliteal and pedal pulses  bilaterally.  Capillary refill normal.  Pulmonary/Chest:  Mild respiratory distress present.  Bilateral basal fine crackles heard, bilateral diffuse  expiratory wheeze present.  Abdominal: Soft. She exhibits no distension (Obese, small pannus present).  Musculoskeletal: Normal range of motion. She exhibits edema (2+ pitting bilateral leg edema) and deformity (Bilateral greater toe amputation present, left ankle arthrodesis).  Neurological: She is alert and oriented to person, place, and time.  Skin: Skin is warm and dry.  Psychiatric: She has a normal mood and affect.     Labs:   Lab Results  Component Value Date   WBC 7.9 09/23/2017   HGB 9.0 (L) 09/23/2017   HCT 30.3 (L) 09/23/2017   MCV 78.1 09/23/2017   PLT 256 09/23/2017    Recent Labs  Lab 09/22/17 1322 09/23/17 0457  NA 141 139  K 5.4* 5.1  CL 109 108  CO2 22 20*  BUN 30* 31*  CREATININE 1.53* 1.60*  CALCIUM 8.6* 8.2*  PROT 7.0  --   BILITOT 0.5  --   ALKPHOS 77  --   ALT 33  --   AST 33  --   GLUCOSE 117* 141*    Lipid Panel     Component Value Date/Time   CHOL 176 07/12/2016 1340   TRIG 237 (H) 07/12/2016 1340   HDL 39 (L) 07/12/2016 1340   CHOLHDL 4.5 07/12/2016 1340   VLDL 47 (H) 07/12/2016 1340   LDLCALC 90 07/12/2016 1340    BNP (last 3 results) Recent Labs    09/04/17 1549 09/22/17 1322  BNP 978.5* 1,650.6*    HEMOGLOBIN A1C Lab Results  Component Value Date   HGBA1C 7.4 (H) 09/22/2017   MPG 165.68 09/22/2017    Cardiac Panel (last 3 results) Recent Labs    09/22/17 1322  TROPONINI <0.03   BNP (last 3 results) Recent Labs    09/04/17 1549 09/22/17 1322  BNP 978.5* 1,650.6*   TSH Recent Labs    09/04/17 1549  TSH 2.770    Radiology: Dg Chest 2 View  Result Date: 09/22/2017 CLINICAL DATA:  Short of breath EXAM: CHEST - 2 VIEW COMPARISON:  None. FINDINGS: Mild cardiac enlargement without heart failure or edema Left lower lobe infiltrate and small left effusion,  suspicious for pneumonia. No acute skeletal lesion IMPRESSION: Left lower lobe infiltrate and small left effusion, suspicious for pneumonia Electronically Signed   By: Franchot Gallo M.D.   On: 09/22/2017 12:43    Scheduled Meds: . carvedilol  3.125 mg Oral BID WC  . chlorproMAZINE  25 mg Oral QHS  . docusate sodium  100 mg Oral BID  . enoxaparin (LOVENOX) injection  40 mg Subcutaneous Q24H  . famotidine  20 mg Oral BID  . furosemide  60 mg Intravenous BID  . gabapentin  600 mg Oral TID  . insulin aspart  0-9 Units Subcutaneous TID WC  . insulin aspart  5 Units Subcutaneous TID WC  . insulin glargine  5 Units Subcutaneous QHS  . ipratropium-albuterol  3 mL Nebulization TID  . lamoTRIgine  100 mg Oral Daily  . loratadine  10 mg Oral Daily  . losartan  50 mg Oral Daily  . senna  1 tablet Oral BID  . sodium chloride flush  3 mL Intravenous Q12H   Continuous Infusions: . sodium chloride    . aztreonam Stopped (09/23/17 0526)  . vancomycin Stopped (09/22/17 2141)   PRN Meds:.sodium chloride, acetaminophen **OR** acetaminophen, diphenhydrAMINE, HYDROcodone-acetaminophen, ondansetron **OR** ondansetron (ZOFRAN) IV, polyethylene glycol, sodium chloride flush  CARDIAC STUDIES:  EKG 09/22/2017: Sinus tachycardia at rate of 100 bpm, LBBB.  No further  analysis.  Echocardiogram 09/22/2017: Severe LV systolic dysfunction, EF 15 to 20% with diffuse hypokinesis.  Normal cavity size.  Grade 3 diastolic dysfunction, restrictive physiology.  Moderately dilated right ventricle with severely reduced RV systolic function.  Moderate pulmonary hypertension, PA pressure 43 mmHg.  ASSESSMENT AND PLAN:  1.  Acute systolic and diastolic heart failure 2.  Diabetes mellitus type 2 uncontrolled with peripheral neuropathy and PAD 3.  Hypertension 4.  LBBB 5.  Vertigo, suspect BPH 6.  Stage III chronic kidney disease 7.  Pneumonia as per primary team  Recommendation: Patient presentation superimposed on  pneumonia, has acute decompensated heart failure.  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  Due to diabetes and other risk factors including hypertension and renal disease, multivessel disease is always a possibility.  Would recommend optimization of medical therapy, I will start the patient on BiDil 1 p.o. 3 times daily and titrated up to 2 tablets 3 times daily.  We will hold off on Entresto in view of stage III CKD and continue losartan at moderate dose.  Continue low-dose beta-blocker for now, once acute decompensated failure improves we can uptitrate the medication dosage.  We will consider further cardiac work-up including stress testing versus cardiac catheterization at a later date.  Continue diuresis.  Patient lives in [employer] for homeless, essentially eats frozen food and the most she cooks is potatoes and cheese.  I have discussed with her regarding making lifestyle changes.  Would recommend PT evaluation while she is in the hospital, she also appears to have severe BPH.  Thank you for the consultation.  Adrian Prows, MD 09/23/2017, 11:36 AM [facility]. [place] Pager: [phone] Office: [phone] If no answer Cell [phone]

## 2017-09-24 LAB — CBC WITH DIFFERENTIAL/PLATELET
BASOS ABS: 0.1 10*3/uL (ref 0.0–0.1)
BASOS PCT: 1 %
EOS PCT: 7 %
Eosinophils Absolute: 0.5 10*3/uL (ref 0.0–0.7)
HEMATOCRIT: 27.5 % — AB (ref 36.0–46.0)
Hemoglobin: 8.3 g/dL — ABNORMAL LOW (ref 12.0–15.0)
Lymphocytes Relative: 19 %
Lymphs Abs: 1.4 10*3/uL (ref 0.7–4.0)
MCH: 23.5 pg — ABNORMAL LOW (ref 26.0–34.0)
MCHC: 30.2 g/dL (ref 30.0–36.0)
MCV: 77.9 fL — AB (ref 78.0–100.0)
MONO ABS: 0.6 10*3/uL (ref 0.1–1.0)
MONOS PCT: 9 %
NEUTROS ABS: 4.8 10*3/uL (ref 1.7–7.7)
Neutrophils Relative %: 64 %
PLATELETS: 244 10*3/uL (ref 150–400)
RBC: 3.53 MIL/uL — ABNORMAL LOW (ref 3.87–5.11)
RDW: 17.9 % — ABNORMAL HIGH (ref 11.5–15.5)
WBC: 7.5 10*3/uL (ref 4.0–10.5)

## 2017-09-24 LAB — BASIC METABOLIC PANEL
Anion gap: 10 (ref 5–15)
Chloride: 105 mmol/L (ref 101–111)
Creatinine, Ser: 1.84 mg/dL — ABNORMAL HIGH (ref 0.44–1.00)
GFR calc Af Amer: 32 mL/min — ABNORMAL LOW (ref >60)
Potassium: 4.6 mmol/L (ref 3.5–5.1)
Sodium: 137 mmol/L (ref 135–145)

## 2017-09-24 LAB — LIPID PANEL
Cholesterol: 90 mg/dL (ref 0–200)
HDL: 27 mg/dL — AB (ref >40)
LDL CALC: 47 mg/dL (ref 0–99)
Total CHOL/HDL Ratio: 3.3 RATIO
Triglycerides: 79 mg/dL (ref <150)
VLDL: 16 mg/dL (ref 0–40)

## 2017-09-24 LAB — BASIC METABOLIC PANEL WITH GFR
BUN: 35 mg/dL — ABNORMAL HIGH (ref 6–20)
CO2: 22 mmol/L (ref 22–32)
Calcium: 8.1 mg/dL — ABNORMAL LOW (ref 8.9–10.3)
GFR calc non Af Amer: 28 mL/min — ABNORMAL LOW (ref >60)
Glucose, Bld: 169 mg/dL — ABNORMAL HIGH (ref 65–99)

## 2017-09-24 NOTE — Progress Notes (Signed)
Subjective:  Feels much better with regards to orthopnea. Cough and dyspnea still persist.   Objective:  Vital Signs in the last 24 hours: Temp:  [97.4 F (36.3 C)-98.5 F (36.9 C)] 98 F (36.7 C) (05/19 0405) Pulse Rate:  [83-89] 89 (05/19 0405) Resp:  [20] 20 (05/19 0405) BP: (126-146)/(75-88) 126/76 (05/19 0405) SpO2:  [92 %-100 %] 92 % (05/19 0738) Weight:  [100.7 kg (222 lb)] 100.7 kg (222 lb) (05/19 0405)  Intake/Output from previous day: 05/18 0701 - 05/19 0700 In: 700 [P.O.:600; IV Piggyback:100] Out: 1950 [Urine:1949; Stool:1] Intake/Output from this shift: No intake/output data recorded.  Blood pressure 126/76, pulse 89, temperature 98 F (36.7 C), temperature source Oral, resp. rate 20, height  (1.549 m), weight 100.7 kg (222 lb), SpO2 92 %.   Physical Exam  Constitutional: No distress. She appears acutely ill.  Eyes: Conjunctivae are normal.  Neck: JVD present. No thyromegaly present.  Cardiovascular:  Pulses:      Carotid pulses are 3+ on the right side, and 3+ on the left side.      Radial pulses are 3+ on the right side, and 3+ on the left side.       Femoral pulses are 2+ on the right side, and 2+ on the left side.      Popliteal pulses are 0 on the right side, and 0 on the left side.       Dorsalis pedis pulses are 0 on the right side, and 0 on the left side.       Posterior tibial pulses are 0 on the right side, and 0 on the left side.  Pulmonary/Chest: Effort normal. She has bibasilar rales and diffuse wheezes. Left base coarse crackles. Tenderness is present which mimics chest pain.  Abdominal: Soft. Bowel sounds are normal.  Obese.  Musculoskeletal: She exhibits edema. She exhibits no tenderness.  Neurological: She is alert and oriented to person, place, and time.  Skin: Skin is warm and dry.   Lab Results: Recent Labs    09/23/17 0457 09/24/17 0508  WBC 7.9 7.5  HGB 9.0* 8.3*  PLT 256 244   Recent Labs    09/23/17 0457 09/24/17 0508   NA 139 137  K 5.1 4.6  CL 108 105  CO2 20* 22  GLUCOSE 141* 169*  BUN 31* 35*  CREATININE 1.60* 1.84*   Recent Labs    09/22/17 1322  TROPONINI <0.03   Hepatic Function Panel Recent Labs    09/22/17 1322  PROT 7.0  ALBUMIN 3.2*  AST 33  ALT 33  ALKPHOS 77  BILITOT 0.5   Recent Labs    09/24/17 0508  CHOL 90   PFT 09/05/2017:  The reduced lung volumes, increased FEV1/FVC ratio and diffusion defect suggest an interstitial process such as fibrosis or interstitial inflammation. In view of the severity of the diffusion defect, studies with exercise would be helpful to evaluate the presence of hypoxemia. Pulmonary Function Diagnosis: Moderately severe Restriction -Interstitial Moderately severe Diffusion Defect   Impression and plan:  CARDIAC STUDIES:  EKG 09/22/2017: Sinus tachycardia at rate of 100 bpm, LBBB.  No further analysis.  Echocardiogram 09/22/2017: Severe LV systolic dysfunction, EF 15 to 20% with diffuse hypokinesis.  Normal cavity size.  Grade 3 diastolic dysfunction, restrictive physiology.  Moderately dilated right ventricle with severely reduced RV systolic function.  Moderate pulmonary hypertension, PA pressure 43 mmHg.  Scheduled Meds: . carvedilol  3.125 mg Oral BID WC  . chlorproMAZINE  25 mg Oral QHS  . docusate sodium  100 mg Oral BID  . enoxaparin (LOVENOX) injection  40 mg Subcutaneous Q24H  . famotidine  20 mg Oral Daily  . furosemide  60 mg Intravenous BID  . gabapentin  600 mg Oral BID  . insulin aspart  0-9 Units Subcutaneous TID WC  . insulin aspart  5 Units Subcutaneous TID WC  . insulin glargine  5 Units Subcutaneous QHS  . ipratropium-albuterol  3 mL Nebulization TID  . isosorbide-hydrALAZINE  1 tablet Oral TID  . lamoTRIgine  100 mg Oral Daily  . loratadine  10 mg Oral Daily  . losartan  50 mg Oral Daily  . senna  1 tablet Oral BID  . sodium chloride flush  3 mL Intravenous Q12H   Continuous Infusions: . sodium chloride     . ceFEPime (MAXIPIME) IV Stopped (09/23/17 2047)   PRN Meds:.sodium chloride, acetaminophen **OR** acetaminophen, clonazePAM, diphenhydrAMINE, HYDROcodone-acetaminophen, ondansetron **OR** ondansetron (ZOFRAN) IV, polyethylene glycol, sodium chloride flush   ASSESSMENT AND PLAN:  1.  Acute systolic and diastolic heart failure 2.  Diabetes mellitus type 2 uncontrolled with peripheral neuropathy and PAD 3.  Hypertension 4.  LBBB 5.  Vertigo, suspect BPH 6.  Stage III chronic kidney disease, slight worsening renal function due to aggressive diuresis. 7.  Pneumonia as per primary team  Recommendation: Patient is presently much more compensated with regard to heart failure, blood pressure is also well controlled.  I will increase carvedilol to 6.25 mg twice daily.  Continue BiDil.  Renal function slightly worse, expect worsening renal function due to aggressive diuresis.  Continue losartan at low dose.  Strict I's and O's, daily weight recommended.  Orders put in.  She will benefit from physical therapy, podiatry consultation.  Diabetes continues to be uncontrolled, patient is not aware of foods to avoid.   LOS: 2 days   Yates Decamp 09/24/2017, 11:29 AM

## 2017-09-24 NOTE — Progress Notes (Signed)
PROGRESS NOTE  Diane [name]:[id number] DOB: 08/10/1950 DOA: 09/22/2017 PCP: Marcine Matar, MD  HPI/Recap of past 24 hours: HPI from Dr Clearnce Sorrel on 09/22/17 Diane [name] is a 67 y.o. female with medical history significant of stage III CKD, hypertension, hyperlipidemia, type 2 diabetes on insulin, neuropathy, complicated by Charcot joints, presents to the ED c/o worsening SOB/dyspnea, orthopnea, PND, worsening BLE edema for the past month. Recently she reports productive cough of yellowish sputum, subjective fever/chills. In the ED patient required 2 L nasal cannula due to hypoxia. Chest x-ray showed pneumonia. Pt was started on IV AB with IVF boluses. Pt admitted for further management.  Recently admitted for right lower extremity cellulitis in 07/2017.  Today, pt still coughing, improved SOB. Denies any chest pain/pressure, abdominal pain, N/V/D/C, fever/chills.   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 Plan for possible stress test versus cardiac catheterization at a later  date Strict I's and O's, daily weight  Acute hypoxic respiratory failure Likely multifactorial: HCAP, CHF, pulm HTN Currently saturating well on [place] O2 Continue duo nebs Supplemental oxygen  AKI on CKD stage III Baseline creatinine around 1.5-1.6 Likely due to diuretics/ACEi Daily BMP, monitor closely  Anemia of chronic kidney disease Baseline around 9-10, currently at baseline Monitor closely, daily CBC  Diabetes mellitus type 2/peripheral neuropathy A1c 7.4 on 09/2017 Start SSI, novolog TID, glargine Hold home NovoLog FlexPen Continue gabapentin  Hypertension Continue home losartan, Coreg  Hyperlipidemia LDL 90 on 07/2016 Continue home pravastatin  History of bipolar/anxiety Continue lamotrigine, Klonopin, Thorazine  Morbid obesity      Code Status: Full  Family Communication: None at bedside  Disposition Plan: SNF   Consultants:  Cardiology  Procedures:  None  Antimicrobials:  IV cefepime  DVT prophylaxis: Lovenox   Objective: Vitals:   09/24/17 0405 09/24/17 0738 09/24/17 1320 09/24/17 1341  BP: 126/76  (!) 153/57   Pulse: 89  90   Resp: 20  19   Temp: 98 F (36.7 C)  98.2 F (36.8 C)   TempSrc: Oral  Oral   SpO2: 100% 92% 98% 96%  Weight: 100.7 kg (222 lb)     Height:        Intake/Output Summary (Last 24 hours) at 09/24/2017 1549 Last data filed at 09/24/2017 1546 Gross per 24 hour  Intake 580 ml  Output 2100 ml  Net -1520 ml   Filed Weights   09/22/17 1721 09/23/17 0500 09/24/17 0405  Weight: 98.9 kg (218 lb) 100.7 kg (222 lb) 100.7 kg (222 lb)    Exam:   General: NAD  Cardiovascular: S1, S2 present  Respiratory: Bibasilar crackles  Abdomen: Soft, obese, nontender, bowel sounds present  Musculoskeletal:  1+ bilateral pedal edema  Skin: Normal  Psychiatry: Normal mood   Data Reviewed: CBC: Recent Labs  Lab 09/22/17 1322 09/23/17 0457 09/24/17 0508  WBC 8.0 7.9 7.5  NEUTROABS 5.6  --  4.8  HGB 9.7*  9.0* 8.3*  HCT 31.9* 30.3* 27.5*  MCV 77.6* 78.1 77.9*  PLT 264 256 244   Basic Metabolic Panel: Recent Labs  Lab 09/22/17 1322 09/23/17 0457 09/24/17 0508  NA 141 139 137  K 5.4* 5.1 4.6  CL 109 108 105  CO2 22 20* 22  GLUCOSE 117* 141* 169*  BUN 30* 31* 35*  CREATININE 1.53* 1.60* 1.84*  CALCIUM 8.6* 8.2* 8.1*  MG  --  1.8  --    GFR: Estimated Creatinine Clearance: 33.2 mL/min (A) (by C-G formula based on SCr of 1.84 mg/dL (H)). Liver Function Tests: Recent Labs  Lab 09/22/17 1322  AST 33  ALT 33  ALKPHOS 77  BILITOT 0.5  PROT 7.0  ALBUMIN 3.2*   Recent Labs  Lab 09/22/17 1322  LIPASE 22   No results for input(s): AMMONIA in the last 168 hours. Coagulation Profile: No results for input(s): INR, PROTIME in the last 168 hours. Cardiac Enzymes: Recent Labs  Lab 09/22/17 1322  TROPONINI <0.03   BNP (last 3 results) No results for input(s): PROBNP in the last 8760 hours. HbA1C: Recent Labs    09/22/17 1829  HGBA1C 7.4*   CBG: Recent Labs  Lab 09/22/17 1737 09/22/17 2044  GLUCAP 125* 141*   Lipid Profile: Recent Labs    09/24/17 0508  CHOL 90  HDL 27*  LDLCALC 47  TRIG 79  CHOLHDL 3.3   Thyroid Function Tests: No results for input(s): TSH, T4TOTAL, FREET4, T3FREE, THYROIDAB in the last 72 hours. Anemia Panel: No results for input(s): VITAMINB12, FOLATE, FERRITIN, TIBC, IRON, RETICCTPCT in the last 72 hours. Urine analysis:    Component Value Date/Time   COLORURINE YELLOW 09/22/2017 1248   APPEARANCEUR CLEAR 09/22/2017 1248   LABSPEC 1.013 09/22/2017 1248   PHURINE 5.0 09/22/2017 1248   GLUCOSEU NEGATIVE 09/22/2017 1248   HGBUR SMALL (A) 09/22/2017 1248   BILIRUBINUR NEGATIVE 09/22/2017 1248   BILIRUBINUR negative 05/31/2016 1401   KETONESUR NEGATIVE 09/22/2017 1248   PROTEINUR >=300 (A) 09/22/2017 1248   UROBILINOGEN 0.2 05/31/2016 1401   NITRITE NEGATIVE 09/22/2017 1248   LEUKOCYTESUR NEGATIVE 09/22/2017 1248   Sepsis  Labs: (procalcitonin:4,lacticidven:4)  ) Recent Results (from the past 240 hour(s))  Urine Culture     Status: Abnormal   Collection Time: 09/22/17 12:48 PM  Result Value Ref Range Status   Specimen Description   Final    URINE, RANDOM Performed at [facility], [address], [place], [place] [address]    Special Requests   Final    NONE Performed at [facility], [address], [place], [place] [address]    Culture (A)  Final    <10,000 COLONIES/mL INSIGNIFICANT GROWTH Performed at [facility], [address] [place], [place] [address]    Report Status 09/23/2017 FINAL  Final  Culture, blood (Routine X 2) w Reflex to ID Panel     Status: None (Preliminary result)   Collection Time: 09/22/17  1:22 PM  Result Value Ref Range Status   Specimen Description   Final    BLOOD RIGHT FOREARM Performed at [facility], [address], [place], [place] [address]    Special Requests   Final  BOTTLES DRAWN AEROBIC AND ANAEROBIC Blood Culture adequate volume Performed at [facility], [address], [place], [place] [address]    Culture   Final    NO GROWTH 2 DAYS Performed at [facility], [address], [place], [place] [address]    Report Status PENDING  Incomplete  Culture, blood (Routine X 2) w Reflex to ID Panel     Status: None (Preliminary result)   Collection Time: 09/22/17  1:22 PM  Result Value Ref Range Status   Specimen Description   Final    BLOOD LEFT ANTECUBITAL Performed at [facility], [address] [place], [place] [address]    Special Requests   Final    BOTTLES DRAWN AEROBIC AND ANAEROBIC Blood Culture adequate volume Performed at [facility], [address] [place], [place] [address]    Culture   Final    NO GROWTH 2 DAYS Performed at [facility], [address], [place], [place] [address]    Report  Status PENDING  Incomplete  MRSA PCR Screening     Status: None   Collection Time: 09/23/17  8:01 AM  Result Value Ref Range Status   MRSA by PCR NEGATIVE NEGATIVE Final    Comment:        The GeneXpert MRSA Assay (FDA approved for NASAL specimens only), is one component of a comprehensive MRSA colonization surveillance program. It is not intended to diagnose MRSA infection nor to guide or monitor treatment for MRSA infections. Performed at [facility], [address], [place], [place] [address]       Studies: No results found.  Scheduled Meds: . carvedilol  3.125 mg Oral BID WC  . chlorproMAZINE  25 mg Oral QHS  . docusate sodium  100 mg Oral BID  . enoxaparin (LOVENOX) injection  40 mg Subcutaneous Q24H  . famotidine  20 mg Oral Daily  . furosemide  60 mg Intravenous BID  . gabapentin  600 mg Oral BID  . insulin aspart  0-9 Units Subcutaneous TID WC  . insulin aspart  5 Units Subcutaneous TID WC  . insulin glargine  5 Units Subcutaneous QHS  . ipratropium-albuterol  3 mL Nebulization TID  . isosorbide-hydrALAZINE  1 tablet Oral TID  . lamoTRIgine  100 mg Oral Daily  . loratadine  10 mg Oral Daily  . losartan  50 mg Oral Daily  . senna  1 tablet Oral BID  . sodium chloride flush  3 mL Intravenous Q12H    Continuous Infusions: . sodium chloride    . ceFEPime (MAXIPIME) IV Stopped (09/23/17 2047)     LOS: 2 days     Briant Cedar, MD [facility]   If 7PM-7AM, please contact night-coverage [email or web address] Password [facility] 09/24/2017, 3:49 PM

## 2017-09-25 ENCOUNTER — Encounter (HOSPITAL_COMMUNITY): Payer: Self-pay

## 2017-09-25 LAB — GLUCOSE, CAPILLARY
Glucose-Capillary: 179 mg/dL — ABNORMAL HIGH (ref 65–99)
Glucose-Capillary: 133 mg/dL — ABNORMAL HIGH (ref 65–99)
Glucose-Capillary: 89 mg/dL (ref 65–99)
Glucose-Capillary: 118 mg/dL — ABNORMAL HIGH (ref 65–99)
Glucose-Capillary: 126 mg/dL — ABNORMAL HIGH (ref 65–99)
Glucose-Capillary: 173 mg/dL — ABNORMAL HIGH (ref 65–99)
Glucose-Capillary: 95 mg/dL (ref 65–99)
Glucose-Capillary: 137 mg/dL — ABNORMAL HIGH (ref 65–99)
Glucose-Capillary: 103 mg/dL — ABNORMAL HIGH (ref 65–99)
Glucose-Capillary: 132 mg/dL — ABNORMAL HIGH (ref 65–99)
Glucose-Capillary: 103 mg/dL — ABNORMAL HIGH (ref 65–99)
Glucose-Capillary: 128 mg/dL — ABNORMAL HIGH (ref 65–99)

## 2017-09-25 LAB — CBC WITH DIFFERENTIAL/PLATELET
Basophils Absolute: 0.1 10*3/uL (ref 0.0–0.1)
Basophils Relative: 1 %
EOS ABS: 0.6 10*3/uL (ref 0.0–0.7)
Eosinophils Relative: 7 %
HCT: 29.3 % — ABNORMAL LOW (ref 36.0–46.0)
Hemoglobin: 8.9 g/dL — ABNORMAL LOW (ref 12.0–15.0)
LYMPHS ABS: 1.7 10*3/uL (ref 0.7–4.0)
LYMPHS PCT: 20 %
MCH: 23.4 pg — AB (ref 26.0–34.0)
MCHC: 30.4 g/dL (ref 30.0–36.0)
MCV: 77.1 fL — AB (ref 78.0–100.0)
Monocytes Absolute: 0.7 10*3/uL (ref 0.1–1.0)
Monocytes Relative: 8 %
NEUTROS PCT: 64 %
Neutro Abs: 5.6 10*3/uL (ref 1.7–7.7)
Platelets: 276 10*3/uL (ref 150–400)
RBC: 3.8 MIL/uL — AB (ref 3.87–5.11)
RDW: 17.9 % — ABNORMAL HIGH (ref 11.5–15.5)
WBC: 8.7 10*3/uL (ref 4.0–10.5)

## 2017-09-25 LAB — BASIC METABOLIC PANEL
Anion gap: 10 (ref 5–15)
BUN: 36 mg/dL — ABNORMAL HIGH (ref 6–20)
CO2: 22 mmol/L (ref 22–32)
Calcium: 8.4 mg/dL — ABNORMAL LOW (ref 8.9–10.3)
Creatinine, Ser: 1.69 mg/dL — ABNORMAL HIGH (ref 0.44–1.00)
GFR calc non Af Amer: 31 mL/min — ABNORMAL LOW (ref >60)
Glucose, Bld: 108 mg/dL — ABNORMAL HIGH (ref 65–99)

## 2017-09-25 LAB — LEGIONELLA PNEUMOPHILA SEROGP 1 UR AG: L. pneumophila Serogp 1 Ur Ag: NEGATIVE

## 2017-09-25 LAB — BASIC METABOLIC PANEL WITH GFR
Chloride: 104 mmol/L (ref 101–111)
GFR calc Af Amer: 36 mL/min — ABNORMAL LOW (ref >60)
Potassium: 4.6 mmol/L (ref 3.5–5.1)
Sodium: 136 mmol/L (ref 135–145)

## 2017-09-25 MED ORDER — CARVEDILOL 6.25 MG PO TABS
6.2500 mg | ORAL_TABLET | Freq: Two times a day (BID) | ORAL | Status: DC
  Administered 2017-09-25 – 2017-09-27 (×5): 6.25 mg via ORAL
  Filled 2017-09-25 (×5): qty 1

## 2017-09-25 MED ORDER — FERROUS SULFATE 325 (65 FE) MG PO TABS
325.0000 mg | ORAL_TABLET | Freq: Three times a day (TID) | ORAL | Status: DC
  Administered 2017-09-25 – 2017-09-27 (×6): 325 mg via ORAL
  Filled 2017-09-25 (×6): qty 1

## 2017-09-25 MED ORDER — INSULIN ASPART 100 UNIT/ML ~~LOC~~ SOLN
2.0000 [IU] | Freq: Three times a day (TID) | SUBCUTANEOUS | Status: DC
  Administered 2017-09-25 – 2017-09-27 (×5): 2 [IU] via SUBCUTANEOUS

## 2017-09-25 NOTE — Progress Notes (Signed)
Nutrition Note  RD consulted for nutritional assessment and diet education.  Pt with weight gain, likely related fluid status. Per chart review, non-compliance with diabetes diet. Pt has received DM diet education in the past. Pt living in shelter, limited in food options. Provided diabetes diet education information in discharge information.   Wt Readings from Last 15 Encounters:  09/25/17 222 lb 1.6 oz (100.7 kg)  09/04/17 213 lb 9.6 oz (96.9 kg)  08/21/17 218 lb (98.9 kg)  08/07/17 208 lb (94.3 kg)  07/24/17 208 lb (94.3 kg)  07/24/17 208 lb 9.6 oz (94.6 kg)  06/27/17 219 lb 9.6 oz (99.6 kg)  06/01/17 219 lb 9.6 oz (99.6 kg)  10/25/16 198 lb (89.8 kg)  09/27/16 198 lb (89.8 kg)  09/07/16 198 lb (89.8 kg)  09/06/16 198 lb 3.2 oz (89.9 kg)  08/24/16 202 lb (91.6 kg)  07/12/16 202 lb 9.6 oz (91.9 kg)  06/20/16 202 lb (91.6 kg)    Body mass index is 41.97 kg/m. Patient meets criteria for morbid obesity based on current BMI.   Current diet order is renal/CHO modified , patient is consuming approximately 100% of meals at this time. Labs and medications reviewed.   No nutrition interventions warranted at this time. If nutrition issues arise, please consult RD.   Tilda Franco, MS, RD, LDN [facility] Inpatient Clinical Dietitian Pager: [phone] After Hours Pager: [phone]

## 2017-09-25 NOTE — Discharge Instructions (Signed)
°Carbohydrate Counting for People with Diabetes  ° °Why Is Carbohydrate Counting Important?  °• Counting carbohydrate servings may help you control your blood glucose level so that you feel better.  °• The balance between the carbohydrates you eat and insulin determines what your blood glucose level will be after eating.  °• Carbohydrate counting can also help you plan your meals.  ° °Which Foods Have Carbohydrates?  °Foods with carbohydrates include:  °• Breads, crackers, and cereals  °• Pasta, rice, and grains  °• Starchy vegetables, such as potatoes, corn, and peas  °• Beans and legumes  °• Milk, soy milk, and yogurt  °• Fruits and fruit juices  °• Sweets, such as cakes, cookies, ice cream, jam, and jelly  ° °Carbohydrate Servings  °In diabetes meal planning, 1 serving of a food with carbohydrate has about 15 grams of carbohydrate:  °• Check serving sizes with measuring cups and spoons or a food scale.  °• Read the Nutrition Facts on food labels to find out how many grams of carbohydrate are in foods you eat.  °• The food lists in this handout show portions that have about 15 grams of carbohydrate.  ° ° °Meal Planning Tips  °• An Eating Plan tells you how many carbohydrate servings to eat at your meals and snacks. For many adults, eating 3 to 5 servings of carbohydrate foods at each meal and 1 or 2 carbohydrate servings for each snack works well.  °• In a healthy daily Eating Plan, most carbohydrates come from:  °o At least 6 servings of fruits and nonstarchy vegetables  °o At least 6 servings of grains, beans, and starchy vegetables, with at least 3 servings from whole grains  °o At least 2 servings of milk or milk products  °• Check your blood glucose level regularly. It can tell you if you need to adjust when you eat carbohydrates.  °• Eating foods that have fiber, such as whole grains, and having very few salty foods is good for your health.  °• Eat 4 to 6 ounces of meat or other protein foods (such as  soybean burgers) each day. Choose low-fat sources of protein, such as lean beef, lean pork, chicken, fish, low-fat cheese, or vegetarian foods such as soy.  °• Eat some healthy fats, such as olive oil, canola oil, and nuts.  °• Eat very little saturated fats. These unhealthy fats are found in butter, cream, and high-fat meats, such as bacon and sausage.  °• Eat very little or no trans fats. These unhealthy fats are found in all foods that list “partially hydrogenated oil” as an ingredient.  ° °Label Reading Tips  °The Nutrition Facts panel on a label lists the grams of total carbohydrate in 1 standard serving. The label’s standard serving may be larger or smaller than 1 carbohydrate serving.  °To figure out how many carbohydrate servings are in the food:  °• First, look at the label’s standard serving size.  °• Check the grams of total carbohydrate. This is the amount of carbohydrate in 1 standard serving.  °• Divide the grams of total carbohydrate by 15. This number equals the number of carbohydrate servings in 1 standard serving. Remember: 1 carbohydrate serving is 15 grams of carbohydrate.  ° °Note: You may ignore the grams of sugars on the Nutrition Facts panel because they are included in the grams of total carbohydrate.  ° °Food Lists for Carbohydrate Counting  °1 serving = about 15 grams of carbohydrate  °Starches  °•   1 slice bread (1 ounce)  °• 1 tortilla (6-inch size)  °• ¼ large bagel (1 ounce)  °• 2 taco shells (5-inch size)  °• ½ hamburger or hot dog bun (¾ ounce)  °• ¾ cup ready-to-eat unsweetened cereal  °• ½ cup cooked cereal  °• 1 cup broth-based soup  °• 4 to 6 small crackers  °• ? cup pasta or rice (cooked)  °• ½ cup beans, peas, corn, sweet potatoes, winter squash, or mashed or boiled potatoes (cooked)  °• ¼ large baked potato (3 ounces)  °• ¾ ounce pretzels, potato chips, or tortilla chips  °• 3 cups popcorn (popped)  ° °Sweets and Desserts  °• 2-inch square cake (unfrosted)  °• 2 small cookies  (? ounce)  °• ½ cup ice cream or frozen yogurt  °• ¼ cup sherbet or sorbet  °• 1 tablespoon syrup, jam, jelly, table sugar, or honey  °• 2 tablespoons light syrup  ° °Milk  °• 1 cup fat-free or reduced-fat milk  °• 1 cup soy milk  °• ? cup (6 ounces) nonfat yogurt sweetened with sugar-free sweetener  ° °Fruit  °• 1 small fresh fruit (¾ to 1 cup)  °• ½ cup canned or frozen fruit  °• 2 tablespoons dried fruit (blueberries, cherries, cranberries, mixed fruit, raisins)  °• 17 small grapes (3 ounces)  °• 1 cup melon or berries  °• ½ cup unsweetened fruit juice  ° °Other Foods  °• Count 1 cup raw vegetables or ½ cup cooked nonstarchy vegetables as zero (0) carbohydrate servings or “free” foods. If you eat 3 or more servings at one meal, count them as 1 carbohydrate serving.  °• Foods that have less than 20 calories in each serving also may be counted as zero carbohydrate servings or “free” foods.  °• Count 1 cup of casserole or other mixed foods as 2 carbohydrate servings.  ° ° °Carbohydrate Counting for People with Diabetes Sample 1-Day Menu Meal  Menu   °Breakfast  1 extra-small banana (1 carbohydrate serving)  °¾ cup corn flakes (1 carbohydrate serving) with 1 cup low-fat or fat-free milk (1 carbohydrate serving)  °1 slice whole wheat bread (1 carbohydrate serving) with 1 teaspoon margarine   °Lunch  2 ounces turkey slices with 2 lettuce leaves on 2 slices whole wheat bread (2 carbohydrate servings)  °4 celery sticks  °4 carrot sticks  °1 medium apple (1 carbohydrate serving)  °1 cup low-fat or fat-free milk (1 carbohydrate serving)   °Afternoon Snack  2 tablespoons raisins (1 carbohydrate serving)  °¾ ounce unsalted mini pretzels (1 carbohydrate serving)   °Evening Meal  3 ounces lean roast beef  °1/2 large baked potato (2 carbohydrate servings) with 1 tablespoon reduced-fat sour cream  °1/2 cup green beans  °1 cup vegetable salad with 1 tablespoon light salad dressing  °1 whole wheat dinner roll (1 carbohydrate  serving) with 1 teaspoon margarine  °1 cup melon balls (1 carbohydrate serving)   °Evening Snack  6 ounces low-fat sugar-free fruit yogurt (1 carbohydrate serving)  °2 tablespoons unsalted nuts   ° ° °

## 2017-09-25 NOTE — NC FL2 (Signed)
[place] MEDICAID FL2 LEVEL OF CARE SCREENING TOOL     IDENTIFICATION  Patient Name: Diane [name] Birthdate: [birth year] Sex: female Admission Date (Current Location): 09/22/2017  [facility] and [place] Number:  Producer, television/film/video and Address:  [facility],  [address] [place] [address]      Provider Number: [id number]  Attending Physician Name and Address:  Briant Cedar, MD  Relative Name and Phone Number:       Current Level of Care: Hospital Recommended Level of Care: Skilled Nursing Facility Prior Approval Number:    Date Approved/Denied:   PASRR Number: pending  Discharge Plan: SNF    Current Diagnoses: Patient Active Problem List   Diagnosis Date Noted  . HCAP (healthcare-associated pneumonia) 09/22/2017  . HLD (hyperlipidemia) 09/22/2017  . Iron deficiency anemia 09/22/2017  . Acute hypoxemic respiratory failure (HCC) 09/22/2017  . Polyarthralgia 09/04/2017  . Pre-ulcerative corn or callous 09/04/2017  . Cellulitis 07/24/2017  . Pain in left leg 04/20/2017  . Charcot foot due to diabetes mellitus (HCC) 02/21/2017  . Unilateral primary osteoarthritis, right knee 09/07/2016  . Pain in right hip 09/07/2016  . Vitamin D deficiency 07/25/2016  . Diabetic ulcer of right heel associated with type 2 diabetes mellitus, limited to breakdown of skin (HCC) 05/12/2016  . Osteomyelitis of ankle or foot   . 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 Right foot ulcer, limited to breakdown of skin (HCC) 04/26/2016  . Hypertension 04/26/2016  . DM (diabetes mellitus) type II controlled with renal manifestation (HCC) 04/26/2016  . Fibromyalgia 04/26/2016  . Diabetic polyneuropathy (HCC) 04/26/2016    Orientation RESPIRATION BLADDER Height & Weight     Self, Time, Situation, Place  O2 Continent Weight: 222 lb 1.6 oz (100.7 kg) Height:   (154.9 cm)   BEHAVIORAL SYMPTOMS/MOOD NEUROLOGICAL BOWEL NUTRITION STATUS      Incontinent Diet(see dc summary)  AMBULATORY STATUS COMMUNICATION OF NEEDS Skin   Limited Assist Verbally Normal                       Personal Care Assistance Level of Assistance  Bathing, Feeding, Dressing Bathing Assistance: Maximum assistance Feeding assistance: Independent Dressing Assistance: Maximum assistance     Functional Limitations Info  Sight, Hearing, Speech Sight Info: Adequate Hearing Info: Adequate Speech Info: Adequate    SPECIAL CARE FACTORS FREQUENCY  PT (By licensed PT), OT (By licensed OT)     PT Frequency: 5x/week OT Frequency: 5x/week            Contractures Contractures Info: Not present    Additional Factors Info  Code Status, Allergies, Psychotropic, Insulin Sliding Scale Code Status Info: Full code Allergies Info: Erythromycin;Biaxin Clarithromycin;Feldene Piroxicam;Tetracyclines & Related;Latex;Penicillins;Tape;Nsaids Psychotropic Info:  lamotrigine;Klonopin;Thorazine Insulin Sliding Scale Info: insulin aspart 0-9 UnitsSubcutaneousTID WC;insulin aspart 5 UnitsSubcutaneousTID WC;insulin glargine 5 UnitsSubcutaneousQHS       Current Medications (09/25/2017):  This is the current hospital active medication list Current Facility-Administered Medications  Medication Dose Route Frequency Provider Last Rate Last Dose  . 0.9 %  sodium chloride infusion  250 mL Intravenous PRN Purohit, Shrey C, MD      . acetaminophen (TYLENOL) tablet 650 mg  650 mg Oral Q6H PRN Purohit, Shrey C, MD       Or  . acetaminophen (TYLENOL) suppository 650 mg  650 mg Rectal Q6H PRN Purohit, Salli Quarry, MD      .  carvedilol (COREG) tablet 6.25 mg  6.25 mg Oral BID WC Patwardhan, Manish J, MD   6.25 mg at 09/25/17 1600  . ceFEPIme (MAXIPIME) 1 g in sodium chloride 0.9 % 100 mL IVPB  1 g Intravenous Q24H Adalberto Cole, RPH   Stopped at 09/24/17 2054  . chlorproMAZINE (THORAZINE) tablet 25 mg  25 mg  Oral QHS Purohit, Shrey C, MD   25 mg at 09/24/17 2228  . clonazePAM (KLONOPIN) tablet 0.5 mg  0.5 mg Oral BID PRN Briant Cedar, MD   0.5 mg at 09/25/17 0900  . diphenhydrAMINE (BENADRYL) capsule 25 mg  25 mg Oral Q6H PRN Purohit, Shrey C, MD      . docusate sodium (COLACE) capsule 100 mg  100 mg Oral BID Purohit, Shrey C, MD   100 mg at 09/23/17 2016  . enoxaparin (LOVENOX) injection 40 mg  40 mg Subcutaneous Q24H Purohit, Shrey C, MD      . famotidine (PEPCID) tablet 20 mg  20 mg Oral Daily Briant Cedar, MD   20 mg at 09/25/17 0900  . ferrous sulfate tablet 325 mg  325 mg Oral TID WC Patwardhan, Manish J, MD   325 mg at 09/25/17 1601  . furosemide (LASIX) injection 60 mg  60 mg Intravenous BID Purohit, Shrey C, MD   60 mg at 09/25/17 0746  . gabapentin (NEURONTIN) capsule 600 mg  600 mg Oral BID Briant Cedar, MD   600 mg at 09/25/17 0900  . HYDROcodone-acetaminophen (NORCO/VICODIN) 5-325 MG per tablet 1 tablet  1 tablet Oral Q4H PRN Purohit, Salli Quarry, MD   1 tablet at 09/25/17 1601  . insulin aspart (novoLOG) injection 0-9 Units  0-9 Units Subcutaneous TID WC Purohit, Salli Quarry, MD   1 Units at 09/25/17 1236  . insulin aspart (novoLOG) injection 2 Units  2 Units Subcutaneous TID WC Briant Cedar, MD      . insulin glargine (LANTUS) injection 5 Units  5 Units Subcutaneous QHS Purohit, Salli Quarry, MD   5 Units at 09/24/17 2229  . ipratropium-albuterol (DUONEB) 0.5-2.5 (3) MG/3ML nebulizer solution 3 mL  3 mL Nebulization TID Purohit, Shrey C, MD   3 mL at 09/25/17 1401  . isosorbide-hydrALAZINE (BIDIL) 20-37.5 MG per tablet 1 tablet  1 tablet Oral TID Yates Decamp, MD   1 tablet at 09/25/17 1601  . lamoTRIgine (LAMICTAL) tablet 100 mg  100 mg Oral Daily Purohit, Shrey C, MD   100 mg at 09/25/17 0900  . loratadine (CLARITIN) tablet 10 mg  10 mg Oral Daily Purohit, Shrey C, MD      . losartan (COZAAR) tablet 50 mg  50 mg Oral Daily Purohit, Shrey C, MD   50 mg at 09/25/17 0900   . ondansetron (ZOFRAN) tablet 4 mg  4 mg Oral Q6H PRN Purohit, Shrey C, MD       Or  . ondansetron (ZOFRAN) injection 4 mg  4 mg Intravenous Q6H PRN Purohit, Shrey C, MD      . polyethylene glycol (MIRALAX / GLYCOLAX) packet 17 g  17 g Oral Daily PRN Purohit, Shrey C, MD      . senna (SENOKOT) tablet 8.6 mg  1 tablet Oral BID Purohit, Shrey C, MD   8.6 mg at 09/25/17 0900  . sodium chloride flush (NS) 0.9 % injection 3 mL  3 mL Intravenous Q12H Purohit, Shrey C, MD   3 mL at 09/25/17 0903  . sodium chloride flush (NS) 0.9 %  injection 3 mL  3 mL Intravenous PRN Purohit, Salli Quarry, MD         Discharge Medications: Please see discharge summary for a list of discharge medications.  Relevant Imaging Results:  Relevant Lab Results:   Additional Information SSN [id number]  Antionette Poles, LCSW

## 2017-09-25 NOTE — Clinical Social Work Note (Signed)
Clinical Social Work Assessment  Patient Details  Name: Diane [name] MRN: [id number] Date of Birth: 1950/12/20  Date of referral:  09/25/17               Reason for consult:  Facility Placement                Permission sought to share information with:  Facility Industrial/product designer granted to share information::  Yes, Verbal Permission Granted  Name::        Agency::     Relationship::     Contact Information:     Housing/Transportation Living arrangements for the past 2 months:  Apartment Source of Information:  Patient Patient Interpreter Needed:  None Criminal Activity/Legal Involvement Pertinent to Current Situation/Hospitalization:  No - Comment as needed Significant Relationships:  Friend Lives with:  Self Do you feel safe going back to the place where you live?  (PT recommending SNF) Need for family participation in patient care:  No (Coment)  Care giving concerns:  Patient from home alone. Patient initially reported that her apartment has bed bugs and later reported that she no longer has bed bugs.  Patient admitted with chief complaint SOB. PT recommending SNF for ST rehab.    Social Worker assessment / plan:  CSW spoke with patient at bedside regarding PT recommendation for SNF for ST rehab, patient reported that she is agreeable. CSW and patient spoke at length about patient's current living situation patient initially reported that her current apartment has bed bugs and that they have been treated and gone down but still present. Patient reported that she moved to [facility] from [place] because her health was declining and a friend suggested she move to [facility] for [name]. Patient reported that her health has improved but her living situation has not. Patient reported that she first stayed in a house where she was renting a room and got kicked out then she was homeless. Patient reported she then moved into her current apartment and on the first night she got  bit by bed bugs. Patient reported that the apartment sprayed but did not remove the carpet like they promised. CSW inquired about patient's income and previous attempts to seek new housing. 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 Patient reported that she has not had success on the Internet and that she uses [facility] and she has to make an appointment for transportation in advance. Patient reported that she prefers an apartment where she can have a pet and paint the walls. CSW and patient discussed patient utilizing her income to find a [name] place to live. CSW encouraged patient to plan a day to look at potential apartments and set up transportation with [employer]. CSW agreed to provide patient with additional housing resources to follow up with to find housing. CSW and patient discussed ST rehab at SNF, patient reported that she is agreeable to SNF for ST rehab. CSW explained SNF placement process and insurance authorization. CSW informed patient about possibility that patient may not be able to bring items from home to SNF because of bed bugs, patient reported that she no longer has bed bugs and that they are gone. CSW agreed to complete patient's FL2 and follow up with bed offers. CSW agreed to gather housing resources and provide to patient.  CSW completed FL2, will follow up with bed offers.  CSW will continue to follow and assist with discharge planning.  Employment status:  Retired Database administrator PT Recommendations:  Skilled Nursing Facility Information / Referral to community resources:  Skilled Nursing Facility  Patient/Family's Response to care:  Patient appreciative of CSW assistance with SNF placement process. Patient seeking follow up at home to find housing, noting that she will not have social work assistance once she gets home. CSW agreed to provide patient with housing resources and encouraged patient to work with SW at [facility]  on discharge planning to start process of finding new housing.   Patient/Family's Understanding of and Emotional Response to Diagnosis, Current Treatment, and Prognosis:  Patient presented calm and verbalized understanding of current treatment plan. CSW and patient discussed patient's living situation at length, patient reported some inconsistencies regarding bed bug status at home. Patient initially reported having bed bugs and needing new housing then reported that she no longer has bed bugs. Patient verbalized plan to dc to SNF for ST rehab, patient and CSW discussed stigma attached to having bed bugs. Patient reported that she felt embarassed. CSW validated patient's feelings and provided emotional support. CSW and patient discussed patient pursuing new housing, patient reported concerns about not having a Child psychotherapist at home to help with process. CSW agreed to provide patient with resources and encouraged patient to work with SW at [facility] to assist with process of searching for new housing.   Emotional Assessment Appearance:  Appears older than stated age Attitude/Demeanor/Rapport:  Inconsistent Affect (typically observed):  Calm Orientation:  Oriented to Self, Oriented to Situation, Oriented to Place, Oriented to  Time Alcohol / Substance use:  Not Applicable(Patient reported that her psychiartist is Dr. 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

## 2017-09-25 NOTE — Progress Notes (Signed)
PROGRESS NOTE  Diane [name]:[id number] DOB: 11-22-1950 DOA: 09/22/2017 PCP: Marcine Matar, MD  HPI/Recap of past 24 hours: HPI from Dr Clearnce Sorrel on 09/22/17 Diane [name] is a 67 y.o. female with medical history significant of stage III CKD, hypertension, hyperlipidemia, type 2 diabetes on insulin, neuropathy, complicated by Charcot joints, presents to the ED c/o worsening SOB/dyspnea, orthopnea, PND, worsening BLE edema for the past month. Recently she reports productive cough of yellowish sputum, subjective fever/chills. In the ED patient required 2 L nasal cannula due to hypoxia. Chest x-ray showed pneumonia. Pt was started on IV AB with IVF boluses. Pt admitted for further management.  Recently admitted for right lower extremity cellulitis in 07/2017.  Today, pt noted to still be wheezing, some cough. Denies any chest pain/pressure, abdominal pain, N/V/D/C, fever/chills.   Assessment/Plan: Principal Problem:   HCAP (healthcare-associated pneumonia) Active Problems:   Hypertension   DM (diabetes mellitus) type II controlled with renal manifestation (HCC)   Diabetic polyneuropathy (HCC)   Charcot foot due to diabetes mellitus (HCC)   HLD (hyperlipidemia)   Iron deficiency anemia   Acute hypoxemic respiratory failure (HCC)  HCAP Afebrile, no leukocytosis Urine strep pneumo negative, Legionella negative BC x2 NGTD Chest x-ray showed left lower lobe infiltrate and small left effusion, suspicious for pneumonia Continue IV cefepime Monitor closely  Acute combined systolic&diastolic HF/??NICM BNP 1,650 Trop x1 negative, EKG showed old LBBB CXR as above Echo showed EF of 15 to 20%, diffuse hypokinesis, grade 3 diastolic dysfunction, moderate pulmonary hypertension, PA peak pressure of 43 mmHg Continue IV Lasix 60 mg twice daily Cardiology consulted, recommended starting bidil, coreg, losartan and IV Lasix. Plan for possible cardiac catheterization as an outpt Strict  I's and O's, daily weight  Acute hypoxic respiratory failure Likely multifactorial: HCAP, CHF, pulm HTN Currently saturating well on [place] O2 Continue duo nebs Supplemental oxygen  AKI on CKD stage III Baseline creatinine around 1.5-1.6 Likely due to diuretics/ACEi Daily BMP, monitor closely  Anemia of chronic kidney disease Baseline around 9-10 PO iron supplementation  Monitor closely, daily CBC  Diabetes mellitus type 2/peripheral neuropathy A1c 7.4 on 09/2017 Start SSI, novolog TID, glargine Hold home NovoLog FlexPen Continue gabapentin  Hypertension Continue home losartan, Coreg  Hyperlipidemia LDL 90 on 07/2016 Continue home pravastatin  History of bipolar/anxiety Continue lamotrigine, Klonopin, Thorazine  Morbid obesity      Code Status: Full  Family Communication: None at bedside  Disposition Plan: SNF   Consultants:  Cardiology  Procedures:  None  Antimicrobials:  IV cefepime  DVT prophylaxis: Lovenox   Objective: Vitals:   09/25/17 0746 09/25/17 0822 09/25/17 1256 09/25/17 1401  BP: 134/83  109/61   Pulse: 92  86   Resp:   18   Temp:   97.7 F (36.5 C)   TempSrc:   Oral   SpO2:  97% 94% 93%  Weight:      Height:        Intake/Output Summary (Last 24 hours) at 09/25/2017 1708 Last data filed at 09/25/2017 1400 Gross per 24 hour  Intake 820 ml  Output 3000 ml  Net -2180 ml   Filed Weights   09/23/17 0500 09/24/17 0405 09/25/17 0544  Weight: 100.7 kg (222 lb) 100.7 kg (222 lb) 100.7 kg (222 lb 1.6 oz)    Exam:   General: NAD  Cardiovascular: S1, S2 present  Respiratory: Bibasilar crackles, wheezing noted  Abdomen: Soft, obese, nontender, bowel sounds present  Musculoskeletal: 1+ bilateral pedal  edema  Skin: Normal  Psychiatry: Normal mood   Data Reviewed: CBC: Recent Labs  Lab 09/22/17 1322 09/23/17 0457 09/24/17 0508 09/25/17 0515  WBC 8.0 7.9 7.5 8.7  NEUTROABS 5.6  --  4.8 5.6  HGB 9.7* 9.0* 8.3*  8.9*  HCT 31.9* 30.3* 27.5* 29.3*  MCV 77.6* 78.1 77.9* 77.1*  PLT 264 256 244 276   Basic Metabolic Panel: Recent Labs  Lab 09/22/17 1322 09/23/17 0457 09/24/17 0508 09/25/17 0515  NA 141 139 137 136  K 5.4* 5.1 4.6 4.6  CL 109 108 105 104  CO2 22 20* 22 22  GLUCOSE 117* 141* 169* 108*  BUN 30* 31* 35* 36*  CREATININE 1.53* 1.60* 1.84* 1.69*  CALCIUM 8.6* 8.2* 8.1* 8.4*  MG  --  1.8  --   --    GFR: Estimated Creatinine Clearance: 36.2 mL/min (A) (by C-G formula based on SCr of 1.69 mg/dL (H)). Liver Function Tests: Recent Labs  Lab 09/22/17 1322  AST 33  ALT 33  ALKPHOS 77  BILITOT 0.5  PROT 7.0  ALBUMIN 3.2*   Recent Labs  Lab 09/22/17 1322  LIPASE 22   No results for input(s): AMMONIA in the last 168 hours. Coagulation Profile: No results for input(s): INR, PROTIME in the last 168 hours. Cardiac Enzymes: Recent Labs  Lab 09/22/17 1322  TROPONINI <0.03   BNP (last 3 results) No results for input(s): PROBNP in the last 8760 hours. HbA1C: Recent Labs    09/22/17 1829  HGBA1C 7.4*   CBG: Recent Labs  Lab 09/24/17 1633 09/24/17 2045 09/25/17 0742 09/25/17 1222 09/25/17 1639  GLUCAP 95 137* 103* 132* 103*   Lipid Profile: Recent Labs    09/24/17 0508  CHOL 90  HDL 27*  LDLCALC 47  TRIG 79  CHOLHDL 3.3   Thyroid Function Tests: No results for input(s): TSH, T4TOTAL, FREET4, T3FREE, THYROIDAB in the last 72 hours. Anemia Panel: No results for input(s): VITAMINB12, FOLATE, FERRITIN, TIBC, IRON, RETICCTPCT in the last 72 hours. Urine analysis:    Component Value Date/Time   COLORURINE YELLOW 09/22/2017 1248   APPEARANCEUR CLEAR 09/22/2017 1248   LABSPEC 1.013 09/22/2017 1248   PHURINE 5.0 09/22/2017 1248   GLUCOSEU NEGATIVE 09/22/2017 1248   HGBUR SMALL (A) 09/22/2017 1248   BILIRUBINUR NEGATIVE 09/22/2017 1248   BILIRUBINUR negative 05/31/2016 1401   KETONESUR NEGATIVE 09/22/2017 1248   PROTEINUR >=300 (A) 09/22/2017 1248    UROBILINOGEN 0.2 05/31/2016 1401   NITRITE NEGATIVE 09/22/2017 1248   LEUKOCYTESUR NEGATIVE 09/22/2017 1248   Sepsis Labs: (procalcitonin:4,lacticidven:4)  ) Recent Results (from the past 240 hour(s))  Urine Culture     Status: Abnormal   Collection Time: 09/22/17 12:48 PM  Result Value Ref Range Status   Specimen Description   Final    URINE, RANDOM Performed at [facility], [address], [place], [place] [address]    Special Requests   Final    NONE Performed at [facility], [address], [place], [place] [address]    Culture (A)  Final    <10,000 COLONIES/mL INSIGNIFICANT GROWTH Performed at [facility], [address] [place], [place] [address]    Report Status 09/23/2017 FINAL  Final  Culture, blood (Routine X 2) w Reflex to ID Panel     Status: None (Preliminary result)   Collection Time: 09/22/17  1:22 PM  Result Value Ref Range Status   Specimen Description   Final  BLOOD RIGHT FOREARM Performed at [facility], [address] [place], [place] [address]    Special Requests   Final    BOTTLES DRAWN AEROBIC AND ANAEROBIC Blood Culture adequate volume Performed at [facility], [address] [place], [place] [address]    Culture   Final    NO GROWTH 3 DAYS Performed at [facility], [address] [place], [place] [address]    Report Status PENDING  Incomplete  Culture, blood (Routine X 2) w Reflex to ID Panel     Status: None (Preliminary result)   Collection Time: 09/22/17  1:22 PM  Result Value Ref Range Status   Specimen Description   Final    BLOOD LEFT ANTECUBITAL Performed at [facility], [address], [place], [place] [address]    Special Requests   Final    BOTTLES DRAWN AEROBIC AND ANAEROBIC Blood Culture adequate volume Performed at [facility], [address] [place], [place] [address]    Culture    Final    NO GROWTH 3 DAYS Performed at [facility], [address] [place], [place] [address]    Report Status PENDING  Incomplete  MRSA PCR Screening     Status: None   Collection Time: 09/23/17  8:01 AM  Result Value Ref Range Status   MRSA by PCR NEGATIVE NEGATIVE Final    Comment:        The GeneXpert MRSA Assay (FDA approved for NASAL specimens only), is one component of a comprehensive MRSA colonization surveillance program. It is not intended to diagnose MRSA infection nor to guide or monitor treatment for MRSA infections. Performed at [facility], [address] [place], [place] [address]       Studies: No results found.  Scheduled Meds: . carvedilol  6.25 mg Oral BID WC  . chlorproMAZINE  25 mg Oral QHS  . docusate sodium  100 mg Oral BID  . enoxaparin (LOVENOX) injection  40 mg Subcutaneous Q24H  . famotidine  20 mg Oral Daily  . ferrous sulfate  325 mg Oral TID WC  . furosemide  60 mg Intravenous BID  . gabapentin  600 mg Oral BID  . insulin aspart  0-9 Units Subcutaneous TID WC  . insulin aspart  2 Units Subcutaneous TID WC  . insulin glargine  5 Units Subcutaneous QHS  . ipratropium-albuterol  3 mL Nebulization TID  . isosorbide-hydrALAZINE  1 tablet Oral TID  . lamoTRIgine  100 mg Oral Daily  . loratadine  10 mg Oral Daily  . losartan  50 mg Oral Daily  . senna  1 tablet Oral BID  . sodium chloride flush  3 mL Intravenous Q12H    Continuous Infusions: . sodium chloride    . ceFEPime (MAXIPIME) IV Stopped (09/24/17 2054)     LOS: 3 days     Briant Cedar, MD [facility]   If 7PM-7AM, please contact night-coverage [email or web address] Password [email or web address] 09/25/2017, 5:08 PM

## 2017-09-25 NOTE — Progress Notes (Addendum)
Subjective:  Breathing improved, but continues to have wheezing  Objective:  Vital Signs in the last 24 hours: Temp:  [98 F (36.7 C)-98.2 F (36.8 C)] 98 F (36.7 C) (05/20 0544) Pulse Rate:  [90-92] 92 (05/20 0746) Resp:  [18-19] 18 (05/20 0544) BP: (134-153)/(57-85) 134/83 (05/20 0746) SpO2:  [95 %-98 %] 95 % (05/20 0544) Weight:  [100.7 kg (222 lb 1.6 oz)] 100.7 kg (222 lb 1.6 oz) (05/20 0544)  Intake/Output from previous day: 05/19 0701 - 05/20 0700 In: 720 [P.O.:720] Out: 3600 [Urine:3600] Intake/Output from this shift: No intake/output data recorded.  Physical Exam: Constitutional: No distress. She appears in no distress.  Eyes: Conjunctivae are normal.  Neck: JVD present. No thyromegaly present.  Cardiovascular:  Pulses:      Carotid pulses are 3+ on the right side, and 3+ on the left side.      Radial pulses are 3+ on the right side, and 3+ on the left side.       Femoral pulses are 2+ on the right side, and 2+ on the left side.      Popliteal pulses are 0 on the right side, and 0 on the left side.       Dorsalis pedis pulses are 0 on the right side, and 0 on the left side.       Posterior tibial pulses are 0 on the right side, and 0 on the left side.  Pulmonary/Chest: Effort normal. She has bibasilar rales and diffuse wheezes. Left base coarse crackles. Tenderness is present which mimics chest pain.  Abdominal: Soft. Bowel sounds are normal.  Obese.  Musculoskeletal: She exhibits 2+ edema. She exhibits no tenderness.  Neurological: She is alert and oriented to person, place, and time.  Skin: Skin is warm and dry. Multiple circular lesions with hyperpigmentation all over the body.     Lab Results: Recent Labs    09/24/17 0508 09/25/17 0515  WBC 7.5 8.7  HGB 8.3* 8.9*  PLT 244 276   Recent Labs    09/24/17 0508 09/25/17 0515  NA 137 136  K 4.6 4.6  CL 105 104  CO2 22 22  GLUCOSE 169* 108*  BUN 35* 36*  CREATININE 1.84* 1.69*   Recent Labs   09/22/17 1322  TROPONINI <0.03   Hepatic Function Panel Recent Labs    09/22/17 1322  PROT 7.0  ALBUMIN 3.2*  AST 33  ALT 33  ALKPHOS 77  BILITOT 0.5   Recent Labs    09/24/17 0508  CHOL 90   No results for input(s): PROTIME in the last 72 hours.  Imaging: CXR 09/22/2017: CLINICAL DATA:  Short of breath  EXAM: CHEST - 2 VIEW  COMPARISON:  None.  FINDINGS: Mild cardiac enlargement without heart failure or edema  Left lower lobe infiltrate and small left effusion, suspicious for pneumonia. No acute skeletal lesion  IMPRESSION: Left lower lobe infiltrate and small left effusion, suspicious for pneumonia   Cardiac Studies: EKG 09/22/2017: Sinus rhythm; LBBB  Echocardiogram 09/22/2017: Study Conclusions  - Left ventricle: The cavity size was normal. There was mild   concentric hypertrophy. Systolic function was severely reduced.   The estimated ejection fraction was in the range of 15% to 20%.   Diffuse hypokinesis. Doppler parameters are consistent with   restrictive physiology, indicative of decreased left ventricular   diastolic compliance and/or increased left atrial pressure.   Doppler parameters are consistent with elevated ventricular   end-diastolic filling pressure. - Aortic valve: There  was no regurgitation. - Mitral valve: There was mild regurgitation. - Left atrium: The atrium was normal in size. - Right ventricle: The cavity size was moderately dilated. Wall   thickness was normal. Systolic function was severely reduced. - Pulmonic valve: There was trivial regurgitation. - Pulmonary arteries: Systolic pressure was moderately increased.   PA peak pressure: 43 mm Hg (S). - Inferior vena cava: The vessel was dilated. The respirophasic   diameter changes were blunted (< 50%), consistent with elevated   central venous pressure. - Pericardium, extracardiac: There was no pericardial effusion.  Impressions:  - Severe LV systolic  dysfunction with diffuse hyoukinesis and LVEF   15-20%. Grade 3 diastolic dysfunction. Severely decreased RV   systolic function. Moderate pulmonary hypertension.   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 Tolerating coreg 3.125 mg bid, losartan 50 mg daily, Bidil 20-37.5 mg tid. On Lasix 60 mg IV bid Hypertension CKD stage III Uncontrolled type 2 DM Morbid obesity HCAP: Resolving Iron deficiency anemia: Hb stable 8.5-9.5 Suspected PAD Suspected diabetic neuropathy  Recommendations: Continue IV diuresis with 60 mg IV bid.  Increase coreg to 6.25 mg bid Continue losartan 50 mg daily, Bidil 20-37.5 mg bid Ischemic evaluation of her cardiomyopathy with right and left heart catheterization as outpatient, after acute episode is over, patient is on stable guideline directed medical therapy, and renal function is stabilized. Recommend PO iron replacement. Down the road, she may benefit from IV iron infusion. Management of other medical problems as per the primary team.  Patient's social situation is difficult. She was homeless from November through January. She moved from [place] to [place] at the recommendation of some of her friends, who are no longer available to help her, except one girlfriend. She lives in a 400 Sq ft apartment, which is very likely infested with bed bugs. Consider social work/case manager evaluation to assess her home safety, and if she has any placement options, in light of her multiple complex comorbidities.      LOS: 3 days    Laneah Luft J Aunna Snooks 09/25/2017, 8:30 AM  Aylinn Rydberg Emiliano Dyer, MD [facility] [place] Pager: [phone] Office: [phone] If no answer Cell [phone]

## 2017-09-25 NOTE — Progress Notes (Signed)
Physical Therapy Treatment Patient Details Name: Diane [name] MRN: [id number] DOB: [birth year] Today's Date: 09/25/2017    History of Present Illness Diane [name]  is a 67 y.o. 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  Pt limited today by fatigue and "wooziness."  Pt requiring supplemental oxygen at this time (See mobility section below).  Continue to recommend SNF upon d/c.   Follow Up Recommendations  SNF     Equipment Recommendations  None recommended by PT    Recommendations for Other Services       Precautions / Restrictions Precautions Precautions: Fall Precaution Comments: monitor sats    Mobility  Bed Mobility Overal bed mobility: Needs Assistance Bed Mobility: Supine to Sit     Supine to sit: Min guard     General bed mobility comments: min/guard for safety, lines; increased time  Transfers Overall transfer level: Needs assistance Equipment used: Rolling walker (2 wheeled) Transfers: Sit to/from [employer] Sit to Stand: Min guard Stand pivot transfers: Min guard       General transfer comment: verbal cues for safety and hand placement  Ambulation/Gait Ambulation/Gait assistance: Min assist Ambulation Distance (Feet): 15 Feet   Gait Pattern/deviations: Step-through pattern;Decreased stride length;Trunk flexed     General Gait Details: verbal cues for RW positioning, posture, pt reports "wooziness" however did not worsen, SpO2 85% on room so reapplied 1L O2 and increased to 88-93% on 1L  O2   Stairs             Wheelchair Mobility    Modified Rankin (Stroke Patients Only)       Balance                                            Cognition Arousal/Alertness: Awake/alert Behavior During Therapy: WFL for tasks assessed/performed Overall Cognitive Status: Within Functional Limits for tasks assessed                                        Exercises      General Comments        Pertinent Vitals/Pain Pain Assessment: No/denies pain Pain Intervention(s): Repositioned    Home Living                      Prior Function            PT Goals (current goals can now be found in the care plan section) Progress towards PT goals: Progressing toward goals    Frequency    Min 3X/week      PT Plan Current plan remains appropriate    Co-evaluation              AM-PAC PT "6 Clicks" Daily Activity  Outcome Measure  Difficulty turning over in bed (including adjusting bedclothes, sheets and blankets)?: A Little Difficulty moving from lying on back to sitting  on the side of the bed? : A Lot Difficulty sitting down on and standing up from a chair with arms (e.g., wheelchair, bedside commode, etc,.)?: Unable Help needed moving to and from a bed to chair (including a wheelchair)?: A Little Help needed walking in hospital room?: A Little Help needed climbing 3-5 steps with a railing? : A Lot 6 Click Score: 14    End of Session Equipment Utilized During Treatment: Gait belt;Oxygen Activity Tolerance: Patient limited by fatigue Patient left: in chair;with chair alarm set;with call bell/phone within reach Nurse Communication: Mobility status PT Visit Diagnosis: Difficulty in walking, not elsewhere classified (R26.2)     Time: 8295-6213 PT Time Calculation (min) (ACUTE ONLY): 20 min  Charges:  $Gait Training: 8-22 mins                    G Codes:      Zenovia Jarred, PT, DPT 09/25/2017 Pager:  [phone]  Maida Sale E 09/25/2017, 1:10 PM

## 2017-09-25 NOTE — Care Management Important Message (Signed)
Important Message  Patient Details  Name: Diane [name] MRN: [id number] Date of Birth: [birth year]   Medicare Important Message Given:  Yes    [name], [name] 09/25/2017, 11:47 AMImportant Message  Patient Details  Name: Diane [name] MRN: [id number] Date of Birth: 09-01-1950   Medicare Important Message Given:  Yes    [name], [name] 09/25/2017, 11:47 AM

## 2017-09-26 LAB — CBC WITH DIFFERENTIAL/PLATELET
BASOS ABS: 0.1 10*3/uL (ref 0.0–0.1)
Basophils Relative: 1 %
Eosinophils Absolute: 0.5 10*3/uL (ref 0.0–0.7)
Eosinophils Relative: 6 %
HCT: 28.2 % — ABNORMAL LOW (ref 36.0–46.0)
HEMOGLOBIN: 8.7 g/dL — AB (ref 12.0–15.0)
LYMPHS ABS: 1.5 10*3/uL (ref 0.7–4.0)
Lymphocytes Relative: 18 %
MCH: 23.7 pg — AB (ref 26.0–34.0)
MCHC: 30.9 g/dL (ref 30.0–36.0)
MCV: 76.8 fL — ABNORMAL LOW (ref 78.0–100.0)
Monocytes Absolute: 0.8 10*3/uL (ref 0.1–1.0)
Monocytes Relative: 9 %
NEUTROS ABS: 5.7 10*3/uL (ref 1.7–7.7)
NEUTROS PCT: 66 %
Platelets: 279 10*3/uL (ref 150–400)
RBC: 3.67 MIL/uL — AB (ref 3.87–5.11)
RDW: 17.9 % — ABNORMAL HIGH (ref 11.5–15.5)
WBC: 8.5 10*3/uL (ref 4.0–10.5)

## 2017-09-26 LAB — GLUCOSE, CAPILLARY
Glucose-Capillary: 142 mg/dL — ABNORMAL HIGH (ref 65–99)
Glucose-Capillary: 102 mg/dL — ABNORMAL HIGH (ref 65–99)
Glucose-Capillary: 155 mg/dL — ABNORMAL HIGH (ref 65–99)
Glucose-Capillary: 128 mg/dL — ABNORMAL HIGH (ref 65–99)

## 2017-09-26 LAB — BASIC METABOLIC PANEL WITH GFR
Anion gap: 10 (ref 5–15)
Chloride: 104 mmol/L (ref 101–111)
Potassium: 4.3 mmol/L (ref 3.5–5.1)
Sodium: 136 mmol/L (ref 135–145)

## 2017-09-26 LAB — BASIC METABOLIC PANEL
BUN: 40 mg/dL — ABNORMAL HIGH (ref 6–20)
CO2: 22 mmol/L (ref 22–32)
Calcium: 8.5 mg/dL — ABNORMAL LOW (ref 8.9–10.3)
Creatinine, Ser: 1.88 mg/dL — ABNORMAL HIGH (ref 0.44–1.00)
GFR calc Af Amer: 31 mL/min — ABNORMAL LOW (ref >60)
GFR calc non Af Amer: 27 mL/min — ABNORMAL LOW (ref >60)
Glucose, Bld: 117 mg/dL — ABNORMAL HIGH (ref 65–99)

## 2017-09-26 MED ORDER — FUROSEMIDE 40 MG PO TABS
40.0000 mg | ORAL_TABLET | Freq: Two times a day (BID) | ORAL | Status: DC
  Administered 2017-09-26 – 2017-09-27 (×4): 40 mg via ORAL
  Filled 2017-09-26 (×4): qty 1

## 2017-09-26 MED ORDER — SODIUM CHLORIDE 3 % IN NEBU: 4.0000 mL | INHALATION_SOLUTION | Freq: Two times a day (BID) | RESPIRATORY_TRACT | Status: DC

## 2017-09-26 MED ORDER — SODIUM CHLORIDE 3 % IN NEBU
4.0000 mL | INHALATION_SOLUTION | Freq: Two times a day (BID) | RESPIRATORY_TRACT | Status: DC
  Administered 2017-09-26 – 2017-09-27 (×2): 4 mL via RESPIRATORY_TRACT
  Filled 2017-09-26 (×3): qty 4

## 2017-09-26 MED ORDER — SODIUM CHLORIDE 3 % IN NEBU
4.0000 mL | INHALATION_SOLUTION | Freq: Two times a day (BID) | RESPIRATORY_TRACT | Status: DC
  Administered 2017-09-26: 4 mL via RESPIRATORY_TRACT
  Filled 2017-09-26: qty 4

## 2017-09-26 NOTE — Progress Notes (Signed)
Pharmacy Antibiotic Note  Diane [name] is a 67 y.o. female admitted on 09/22/2017 with pneumonia. Patient initially placed on Vancomycin and Aztreonam, now transitioned to Cefepime. Noted penicillin allergy, but patient has tolerated cephalosporins in the past.     09/26/2017:  Afebrile   WBC, LA WNL  SCr increased to 1.88, CrCl ~33 ml/min  Plan: - Continue present dosing of Cefepime 1g IV q24h. - Monitor renal function, cultures, clinical course. - Consider transition to PO antibiotics  Height:  (154.9 cm) Weight: 223 lb 1.6 oz (101.2 kg) IBW/kg (Calculated) : 47.8  Temp (24hrs), Avg:98 F (36.7 C), Min:97.4 F (36.3 C), Max:98.5 F (36.9 C)  Recent Labs  Lab 09/22/17 1322 09/22/17 1326 09/23/17 0457 09/24/17 0508 09/25/17 0515 09/26/17 0455  WBC 8.0  --  7.9 7.5 8.7 8.5  CREATININE 1.53*  --  1.60* 1.84* 1.69* 1.88*  LATICACIDVEN  --  0.98  --   --   --   --     Estimated Creatinine Clearance: 32.6 mL/min (A) (by C-G formula based on SCr of 1.88 mg/dL (H)).    Allergies  Allergen Reactions  . Erythromycin Anaphylaxis  . Biaxin [Clarithromycin] Other (See Comments)  . Feldene [Piroxicam] Diarrhea, Nausea And Vomiting and Other (See Comments)    And sores in mouth  . Nsaids Other (See Comments)    Contraindicated because of her other meds  . Tetracyclines & Related   . Latex Rash  . Penicillins Rash and Other (See Comments)    Has patient had a PCN reaction causing immediate rash, facial/tongue/throat swelling, SOB or lightheadedness with hypotension: Yes Has patient had a PCN reaction causing severe rash involving mucus membranes or skin necrosis: No Has patient had a PCN reaction that required hospitalization No Has patient had a PCN reaction occurring within the last 10 years: No If all of the above answers are "NO", then may proceed with Cephalosporin use. And sores in her mouth   . Tape Rash    Antimicrobials this admission: 5/17 Vanc >>  5/18 5/17 Levaquin x1 in ED 5/17 Aztreonam >> 5/18 5/18 Cefepime >>   Microbiology results: 5/17 BCx: NGTD 5/17 UCx: insignificant growth 5/18 MRSA PCR: neg  Thank you for allowing pharmacy to be a part of this patient's care.   Greer Pickerel, PharmD, BCPS Pager: [phone] 09/26/2017 2:10 PM

## 2017-09-26 NOTE — Progress Notes (Signed)
Patient received bed offer at [facility] SNF (private room and is able to bring computer and phone). Patient accepted bed offer. SNF is able to accept patient tomorrow 5/22.  Patient received insurance authorization. Start date 5/22; auth number [id number]. CSW provided authorization details to [facility] staff member Velna Hatchet.   CSW will continue to follow and assist with discharge planning.  Celso Sickle, [place] Clinical Social Worker [facility] Cell#: [phone]

## 2017-09-26 NOTE — Progress Notes (Signed)
CSW provided patient with bed offers. Patient selected [facility] SNF. CSW agreed to follow up with [facility] SN[facility] to confirm bed offer. CSW provided patient with housing resources discussed on 5/20 and encouraged patient to follow up with [employer] to make an appointment to complete application for housing. CSW informed patient that making an appointment will allow patient to receive assistance with application for housing, patient reported that was good news. CSW agreed to update patient after speaking with [facility].  CSW contacted [facility] to confirm bed offer, staff reported that they are unable to offer patient a bed.   CSW followed up with an additional bed offer at [facility] to see if they are able to offer patient a bed, staff reported that they have a semi private room available.  CSW followed up with [facility], staff reported that they are unable to offer patient a bed.  CSW followed up with [facility], staff reported that they have a private room available but patient is not able to bring any items including phone and computer.    CSW updated patient, patient reported that she wants a private room at a SNF and to be able to bring her phone and computer. Patient reported that she is agreeable to buying a few new clothes for SNF but is not agreeable to SNF without her phone and computer. Patient reported that if she cant have a private room or bring her phone and computer she will dc back home. CSW agreed to continue to try to find a SNF that has a private room available and will allow patient to bring her phone and computer.    Due to hx of bed bugs, available accepting SNFs request that patient not bring any items from home. Patient not willing to dc to SNF without computer and phone and requesting private room. CSW will continue to try to seek placement for patient.   Celso Sickle, [place] Clinical Social Worker [facility] Cell#: [phone]

## 2017-09-26 NOTE — Progress Notes (Signed)
PROGRESS NOTE  Diane [name] ZOX:[id number] DOB: 07/04/1950 DOA: 09/22/2017 PCP: Marcine Matar, MD  HPI/Recap of past 24 hours: HPI from Dr Clearnce Sorrel on 09/22/17 Diane [name] is a 67 y.o. female with medical history significant of stage III CKD, hypertension, hyperlipidemia, type 2 diabetes on insulin, neuropathy, complicated by Charcot joints, presents to the ED c/o worsening SOB/dyspnea, orthopnea, PND, worsening BLE edema for the past month. Recently she reports productive cough of yellowish sputum, subjective fever/chills. In the ED patient required 2 L nasal cannula due to hypoxia. Chest x-ray showed pneumonia. Pt was started on IV AB with IVF boluses. Pt admitted for further management.  Recently admitted for right lower extremity cellulitis in 07/2017.  Today, pt reported feeling weak, cant cough off sputum.Denies any chest pain/pressure, abdominal pain, N/V/D/C, fever/chills.   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 Plan for possible cardiac catheterization as an  outpt Strict I's and O's, daily weight  Acute hypoxic respiratory failure Likely multifactorial: HCAP, CHF, pulm HTN Currently saturating well on [place] O2 Continue duo nebs, 3% saline duonebs Supplemental oxygen  AKI on CKD stage III Worsened  Baseline creatinine around 1.5-1.6 Likely due to diuretics/ACEi Daily BMP, monitor closely  Anemia of chronic kidney disease Baseline around 9-10 PO iron supplementation  Monitor closely, daily CBC  Diabetes mellitus type 2/peripheral neuropathy A1c 7.4 on 09/2017 Start SSI, novolog TID, glargine Hold home NovoLog FlexPen Continue gabapentin  Hypertension Continue home losartan, Coreg  Hyperlipidemia LDL 90 on 07/2016 Continue home pravastatin  History of bipolar/anxiety Continue lamotrigine, Klonopin, Thorazine  Morbid obesity      Code Status: Full  Family Communication: None at bedside  Disposition Plan: SNF   Consultants:  Cardiology  Procedures:  None  Antimicrobials:  IV cefepime  DVT prophylaxis: Lovenox   Objective: Vitals:   09/26/17 0557 09/26/17 0809 09/26/17 1223 09/26/17 1414  BP: 120/71  (!) 157/85   Pulse: 90  100   Resp: 18  20   Temp: 98.5 F (36.9 C)  (!) 97.4 F (36.3 C)   TempSrc: Oral  Oral   SpO2: 96% 95% 92% 94%  Weight:      Height:        Intake/Output Summary (Last 24 hours) at 09/26/2017 1555 Last data filed at 09/26/2017 0930 Gross per 24 hour  Intake 240 ml  Output -  Net 240 ml   Filed Weights   09/24/17 0405 09/25/17 0544 09/26/17 0500  Weight: 100.7 kg (222 lb) 100.7 kg (222 lb 1.6 oz) 101.2 kg (223 lb 1.6 oz)    Exam:   General: NAD  Cardiovascular: S1, S2 present  Respiratory: Bibasilar crackles  Abdomen: Soft, obese, nontender, bowel  sounds present  Musculoskeletal: 1+ bilateral pedal edema  Skin: Normal  Psychiatry: Normal mood   Data Reviewed: CBC: Recent Labs  Lab 09/22/17 1322 09/23/17 0457 09/24/17 0508 09/25/17 0515  09/26/17 0455  WBC 8.0 7.9 7.5 8.7 8.5  NEUTROABS 5.6  --  4.8 5.6 5.7  HGB 9.7* 9.0* 8.3* 8.9* 8.7*  HCT 31.9* 30.3* 27.5* 29.3* 28.2*  MCV 77.6* 78.1 77.9* 77.1* 76.8*  PLT 264 256 244 276 279   Basic Metabolic Panel: Recent Labs  Lab 09/22/17 1322 09/23/17 0457 09/24/17 0508 09/25/17 0515 09/26/17 0455  NA 141 139 137 136 136  K 5.4* 5.1 4.6 4.6 4.3  CL 109 108 105 104 104  CO2 22 20* GLUCOSE 117* 141* 169* 108* 117*  BUN 30* 31* 35* 36* 40*  CREATININE 1.53* 1.60* 1.84* 1.69* 1.88*  CALCIUM 8.6* 8.2* 8.1* 8.4* 8.5*  MG  --  1.8  --   --   --    GFR: Estimated Creatinine Clearance: 32.6 mL/min (A) (by C-G formula based on SCr of 1.88 mg/dL (H)). Liver Function Tests: Recent Labs  Lab 09/22/17 1322  AST 33  ALT 33  ALKPHOS 77  BILITOT 0.5  PROT 7.0  ALBUMIN 3.2*   Recent Labs  Lab 09/22/17 1322  LIPASE 22   No results for input(s): AMMONIA in the last 168 hours. Coagulation Profile: No results for input(s): INR, PROTIME in the last 168 hours. Cardiac Enzymes: Recent Labs  Lab 09/22/17 1322  TROPONINI <0.03   BNP (last 3 results) No results for input(s): PROBNP in the last 8760 hours. HbA1C: No results for input(s): HGBA1C in the last 72 hours. CBG: Recent Labs  Lab 09/25/17 1222 09/25/17 1639 09/25/17 2258 09/26/17 0759 09/26/17 1135  GLUCAP 132* 103* 128* 142* 102*   Lipid Profile: Recent Labs    09/24/17 0508  CHOL 90  HDL 27*  LDLCALC 47  TRIG 79  CHOLHDL 3.3   Thyroid Function Tests: No results for input(s): TSH, T4TOTAL, FREET4, T3FREE, THYROIDAB in the last 72 hours. Anemia Panel: No results for input(s): VITAMINB12, FOLATE, FERRITIN, TIBC, IRON, RETICCTPCT in the last 72 hours. Urine analysis:    Component Value Date/Time   COLORURINE YELLOW 09/22/2017 1248   APPEARANCEUR CLEAR 09/22/2017 1248   LABSPEC 1.013 09/22/2017 1248   PHURINE 5.0 09/22/2017 1248   GLUCOSEU NEGATIVE 09/22/2017 1248   HGBUR SMALL (A)  09/22/2017 1248   BILIRUBINUR NEGATIVE 09/22/2017 1248   BILIRUBINUR negative 05/31/2016 1401   KETONESUR NEGATIVE 09/22/2017 1248   PROTEINUR >=300 (A) 09/22/2017 1248   UROBILINOGEN 0.2 05/31/2016 1401   NITRITE NEGATIVE 09/22/2017 1248   LEUKOCYTESUR NEGATIVE 09/22/2017 1248   Sepsis Labs: (procalcitonin:4,lacticidven:4)  ) Recent Results (from the past 240 hour(s))  Urine Culture     Status: Abnormal   Collection Time: 09/22/17 12:48 PM  Result Value Ref Range Status   Specimen Description   Final    URINE, RANDOM Performed at [facility], [address], [place], [place] [address]    Special Requests   Final    NONE Performed at [facility], [address], [place], [place] [address]    Culture (A)  Final    <10,000 COLONIES/mL INSIGNIFICANT GROWTH Performed at [facility], [address] [place], [place] [address]    Report Status 09/23/2017 FINAL  Final  Culture, blood (Routine X 2) w Reflex to ID Panel     Status:  None (Preliminary result)   Collection Time: 09/22/17  1:22 PM  Result Value Ref Range Status   Specimen Description   Final    BLOOD RIGHT FOREARM Performed at [facility], [address], [place], [place] [address]    Special Requests   Final    BOTTLES DRAWN AEROBIC AND ANAEROBIC Blood Culture adequate volume Performed at [facility], [address], [place], [place] [address]    Culture   Final    NO GROWTH 4 DAYS Performed at [facility], [address], [place], [place] [address]    Report Status PENDING  Incomplete  Culture, blood (Routine X 2) w Reflex to ID Panel     Status: None (Preliminary result)   Collection Time: 09/22/17  1:22 PM  Result Value Ref Range Status   Specimen Description   Final    BLOOD LEFT ANTECUBITAL Performed at [facility], [address], [place], [place] [address]    Special Requests    Final    BOTTLES DRAWN AEROBIC AND ANAEROBIC Blood Culture adequate volume Performed at [facility], [address], [place], [place] [address]    Culture   Final    NO GROWTH 4 DAYS Performed at [facility], [address] [place], [place] [address]    Report Status PENDING  Incomplete  MRSA PCR Screening     Status: None   Collection Time: 09/23/17  8:01 AM  Result Value Ref Range Status   MRSA by PCR NEGATIVE NEGATIVE Final    Comment:        The GeneXpert MRSA Assay (FDA approved for NASAL specimens only), is one component of a comprehensive MRSA colonization surveillance program. It is not intended to diagnose MRSA infection nor to guide or monitor treatment for MRSA infections. Performed at [facility], [address], [place], [place] [address]       Studies: No results found.  Scheduled Meds: . carvedilol  6.25 mg Oral BID WC  . chlorproMAZINE  25 mg Oral QHS  . docusate sodium  100 mg Oral BID  . enoxaparin (LOVENOX) injection  40 mg Subcutaneous Q24H  . famotidine  20 mg Oral Daily  . ferrous sulfate  325 mg Oral TID WC  . furosemide  40 mg Oral BID  . gabapentin  600 mg Oral BID  . insulin aspart  0-9 Units Subcutaneous TID WC  . insulin aspart  2 Units Subcutaneous TID WC  . insulin glargine  5 Units Subcutaneous QHS  . ipratropium-albuterol  3 mL Nebulization TID  . isosorbide-hydrALAZINE  1 tablet Oral TID  . lamoTRIgine  100 mg Oral Daily  . loratadine  10 mg Oral Daily  . losartan  50 mg Oral Daily  . senna  1 tablet Oral BID  . sodium chloride flush  3 mL Intravenous Q12H  . sodium chloride HYPERTONIC  4 mL Nebulization BID    Continuous Infusions: . sodium chloride    . ceFEPime (MAXIPIME) IV Stopped (09/25/17 2140)     LOS: 4 days     Briant Cedar, MD [facility]   If 7PM-7AM, please contact night-coverage [email or web address] Password [facility] 09/26/2017, 3:55 PM

## 2017-09-26 NOTE — Progress Notes (Signed)
Chart reviewed. Cr increase noted. Switched IV lasix to PO lasix. Will continue to follow.  Elder Negus, MD [facility] Pager: [phone] Office: [phone] If no answer Cell [phone]

## 2017-09-27 ENCOUNTER — Telehealth: Payer: Self-pay

## 2017-09-27 ENCOUNTER — Inpatient Hospital Stay (HOSPITAL_COMMUNITY): Payer: Medicare PPO

## 2017-09-27 ENCOUNTER — Ambulatory Visit (HOSPITAL_COMMUNITY): Payer: Self-pay | Admitting: Licensed Clinical Social Worker

## 2017-09-27 LAB — CULTURE, BLOOD (ROUTINE X 2)
CULTURE: NO GROWTH
Culture: NO GROWTH
Special Requests: ADEQUATE
Special Requests: ADEQUATE

## 2017-09-27 LAB — CBC WITH DIFFERENTIAL/PLATELET
Basophils Absolute: 0.1 10*3/uL (ref 0.0–0.1)
Basophils Relative: 1 %
Eosinophils Absolute: 0.5 10*3/uL (ref 0.0–0.7)
Eosinophils Relative: 7 %
HEMATOCRIT: 27.1 % — AB (ref 36.0–46.0)
Hemoglobin: 8.2 g/dL — ABNORMAL LOW (ref 12.0–15.0)
Lymphocytes Relative: 22 %
Lymphs Abs: 1.6 10*3/uL (ref 0.7–4.0)
MCH: 23.3 pg — AB (ref 26.0–34.0)
MCHC: 30.3 g/dL (ref 30.0–36.0)
MCV: 77 fL — AB (ref 78.0–100.0)
MONO ABS: 0.9 10*3/uL (ref 0.1–1.0)
MONOS PCT: 13 %
NEUTROS ABS: 4.1 10*3/uL (ref 1.7–7.7)
Neutrophils Relative %: 57 %
Platelets: 250 10*3/uL (ref 150–400)
RBC: 3.52 MIL/uL — ABNORMAL LOW (ref 3.87–5.11)
RDW: 17.9 % — AB (ref 11.5–15.5)
WBC: 7.2 10*3/uL (ref 4.0–10.5)

## 2017-09-27 LAB — GLUCOSE, CAPILLARY
Glucose-Capillary: 88 mg/dL (ref 65–99)
Glucose-Capillary: 121 mg/dL — ABNORMAL HIGH (ref 65–99)
Glucose-Capillary: 120 mg/dL — ABNORMAL HIGH (ref 65–99)

## 2017-09-27 LAB — BASIC METABOLIC PANEL WITH GFR
Anion gap: 8 (ref 5–15)
CO2: 24 mmol/L (ref 22–32)
Creatinine, Ser: 1.86 mg/dL — ABNORMAL HIGH (ref 0.44–1.00)
GFR calc Af Amer: 32 mL/min — ABNORMAL LOW (ref >60)
GFR calc non Af Amer: 27 mL/min — ABNORMAL LOW (ref >60)
Glucose, Bld: 101 mg/dL — ABNORMAL HIGH (ref 65–99)

## 2017-09-27 LAB — BASIC METABOLIC PANEL
BUN: 41 mg/dL — ABNORMAL HIGH (ref 6–20)
Calcium: 8.4 mg/dL — ABNORMAL LOW (ref 8.9–10.3)
Chloride: 108 mmol/L (ref 101–111)
Potassium: 4.2 mmol/L (ref 3.5–5.1)
Sodium: 140 mmol/L (ref 135–145)

## 2017-09-27 MED ORDER — HYDROCODONE-ACETAMINOPHEN 5-325 MG PO TABS: 1.0000 | ORAL_TABLET | ORAL | 0 refills | Status: AC | PRN

## 2017-09-27 MED ORDER — SENNOSIDES-DOCUSATE SODIUM 8.6-50 MG PO TABS: 1.0000 | ORAL_TABLET | Freq: Two times a day (BID) | ORAL | 0 refills | Status: AC

## 2017-09-27 MED ORDER — FUROSEMIDE 40 MG PO TABS: 40.0000 mg | ORAL_TABLET | Freq: Every day | ORAL | 0 refills | Status: AC

## 2017-09-27 MED ORDER — CLONAZEPAM 0.5 MG PO TABS: 0.5000 mg | ORAL_TABLET | Freq: Two times a day (BID) | ORAL | 0 refills | Status: AC

## 2017-09-27 MED ORDER — CARVEDILOL 6.25 MG PO TABS: 6.2500 mg | ORAL_TABLET | Freq: Two times a day (BID) | ORAL | 0 refills | Status: AC

## 2017-09-27 MED ORDER — ISOSORB DINITRATE-HYDRALAZINE 20-37.5 MG PO TABS: 1.0000 | ORAL_TABLET | Freq: Three times a day (TID) | ORAL | 0 refills | Status: AC

## 2017-09-27 MED ORDER — IPRATROPIUM-ALBUTEROL 0.5-2.5 (3) MG/3ML IN SOLN: 3.0000 mL | Freq: Three times a day (TID) | RESPIRATORY_TRACT | 0 refills | Status: AC

## 2017-09-27 MED ORDER — POLYETHYLENE GLYCOL 3350 17 G PO PACK: 17.0000 g | PACK | Freq: Every day | ORAL | 0 refills | Status: AC | PRN

## 2017-09-27 NOTE — Progress Notes (Signed)
Reviewed chart. Patient likely to be discharged to nursing home today. Will arrange for Cardiology office follow up in next week.  Elder Negus, MD [facility] Pager: [phone] Office: [phone] If no answer Cell [phone]

## 2017-09-27 NOTE — Discharge Summary (Signed)
[facility] Discharge Summary   Patient: Diane [name] BSJ:[id number]   PCP: Ladell Pier, MD DOB: [birth year]   Date of admission: 09/22/2017   Date of discharge:  09/27/2017    Discharge Diagnoses:  Principal Problem:   HCAP (healthcare-associated pneumonia) Active Problems:   Hypertension   DM (diabetes mellitus) type II controlled with renal manifestation ([place])   Diabetic polyneuropathy ([place])   Charcot foot due to diabetes mellitus ([place])   HLD (hyperlipidemia)   Iron deficiency anemia   Acute hypoxemic respiratory failure ([place])   Admitted From: home Disposition:  SNF  Recommendations for Outpatient Follow-up:  1. Please follow-up with PCP in 1 week as well as cardiology  Follow-up Information    Ladell Pier, MD. Schedule an appointment as soon as possible for a visit in 1 week(s).   Specialty:  Internal Medicine Contact information: [place] [address] [phone]        Cardiovascular, [place]. Schedule an appointment as soon as possible for a visit in 1 week(s).   Contact information: [place] [address] [place] [address] [phone]          Diet recommendation: Cardiac diet carb modified  Activity: The patient is advised to gradually reintroduce usual activities.  Discharge Condition: good  Code Status: Full code  History of present illness: As per the H and P dictated on admission, "Diane [name] is a 67 y.o. female with medical history significant of stage III CKD, hypertension, hyperlipidemia, type 2 diabetes on insulin, chronic lung disease of unclear etiology (PFT on 09/05/2017 she was FEV1 of 61, ,, ratio of 83, DLCO 42) iron deficiency anemia, neuropathy, complicated by Charcot joints, chronic heart failure (unknown if systolic or diastolic), memory problems who comes in with shortness of breath and is found to have left lower lobe pneumonia.  Patient reports that for the past month she has  been getting progressively more short of breath.  She reports that she is noticed increased dyspnea on exertion as well as worsening lower extremity swelling.  The symptoms appear to accelerate for the past week where she began to develop fairly significant shortness of breath even at rest.  She also noted subjective fevers and at her primary care doctor's office noted low-grade temperatures.  She endorsed chills.  She reports she has been coughing up productive sputum.  She denies any chest pain however has noted chest tightness.  She also noted worsening orthopnea and paroxysmal nocturnal dyspnea.  She denies any syncope or presyncope.  For the last day she has had episodes of nonbilious nonbloody emesis and is unable to tolerate any p.o.  She denies any abdominal pain, congestion, rhinorrhea.  She denies any rash"  Hospital Course:  Summary of her active problems in the hospital is as following. HCAP Afebrile, no leukocytosis Urine strep pneumo negative, Legionella negative BC x2 NGTD Chest x-ray showed left lower lobe infiltrate and small left effusion, suspicious for pneumonia Initially started on aztreonam vancomycin and Levaquin, later on converted into cefepime. At present do not suspect that the patient needs any longer-term antibiotic than 5 days that is already been given. Suspicious for pneumonia is also actually low given lack of fever or leukocytosis on admission.  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 Plan for possible cardiac catheterization as an outpt Continue Lasix, prior to admission patient was on 30 mg daily Lasix, will discharge on 40 mg daily Lasix. Can take  20 mg as needed extra.  Acute hypoxic respiratory failure Likely multifactorial: HCAP, CHF, pulm HTN Currently saturating well on [place] O2 Continue duo nebs, Supplemental oxygen  AKI on CKD stage III Worsened  Baseline creatinine around 1.5-1.6 Likely due to diuretics/ACEi  Anemia of chronic kidney disease Baseline around 9-10 PO iron supplementation  Diabetes mellitus type 2/peripheral neuropathy A1c 7.4 on 09/2017 Start SSI, novolog TID, glargine Hold home NovoLog FlexPen Continue gabapentin  Hypertension Continue home losartan, Coreg  Hyperlipidemia LDL 90 on 07/2016 Continue home pravastatin  History of bipolar/anxiety Continue lamotrigine, Klonopin, Thorazine  Morbid obesity Body mass index is 41.68 kg/m.  Recommended dietary changes  Irritable bowel syndrome. Patient reports that she has a history of irritable bowel syndrome. She gets nauseated and has episodes of vomiting whenever she coughs or has a bowel movement. Patient had one episode of vomiting on 09/27/2017, x-ray abdomen was performed showed no acute abnormality and showed moderate stool burden. Please continue on stool softeners on discharge.  Continue PRN Zofran.  Per Requirement for SNF transfer, patient's chronic clonazepam as well as Norco prescriptions were printed.  All other chronic medical condition were stable during the hospitalization.  Patient was seen by physical therapy, who recommended SNF, which was arranged by Education officer, museum and case Freight forwarder. On the day of the discharge the patient's vitals were stable , and no other acute medical condition were reported by patient. the patient was felt safe to be discharge at SNF with therapy.  Consultants: cardiology  Procedures: Echocardiogram   DISCHARGE MEDICATION: Allergies as of 09/27/2017      Reactions   Erythromycin Anaphylaxis   Biaxin [clarithromycin] Other (See Comments)   Feldene [piroxicam] Diarrhea, Nausea And Vomiting, Other  (See Comments)   And sores in mouth   Nsaids Other (See Comments)   Contraindicated because of her other meds   Tetracyclines & Related    Latex Rash   Penicillins Rash, Other (See Comments)   Has patient had a PCN reaction causing immediate rash, facial/tongue/throat swelling, SOB or lightheadedness with hypotension: Yes Has patient had a PCN reaction causing severe rash involving mucus membranes or skin necrosis: No Has patient had a PCN reaction that required hospitalization No Has patient had a PCN reaction occurring within the last 10 years: No If all of the above answers are "NO", then may proceed with Cephalosporin use. And sores in her mouth   Tape Rash      Medication List    STOP taking these medications   diclofenac sodium 1 % Gel Commonly known as:  VOLTAREN   oxybutynin 10 MG 24 hr tablet Commonly known as:  DITROPAN-XL     TAKE these medications   ACCU-CHEK AVIVA PLUS w/Device Kit Use as directed for 3 times daily testing of blood glucose. E11.9   acetaminophen 325 MG tablet Commonly known as:  TYLENOL Take 2 tablets (650 mg total) by mouth every 6 (six) hours as needed for mild pain (or Fever >/= 101).   carvedilol 6.25 MG tablet Commonly known as:  COREG Take 1 tablet (6.25 mg total) by mouth 2 (two) times daily with a meal. What changed:    medication strength  how much to take   chlorproMAZINE 25 MG tablet Commonly  known as:  THORAZINE Take 1 tablet (25 mg total) by mouth at bedtime.   clonazePAM 0.5 MG tablet Commonly known as:  KLONOPIN Take 1 tablet (0.5 mg total) by mouth 2 (two) times daily for 2 doses.   donepezil 5 MG tablet Commonly known as:  ARICEPT Take 1 tablet (5 mg total) by mouth at bedtime.   ergocalciferol 50000 units capsule Commonly known as:  VITAMIN D2 Take 1 capsule (50,000 Units total) by mouth once a week.   famotidine 20 MG tablet Commonly known as:  PEPCID Take 1 tablet (20 mg total) by mouth 2 (two) times  daily.   ferrous sulfate 325 (65 FE) MG tablet Commonly known as:  FERROUSUL Take 1 tablet (325 mg total) by mouth daily with breakfast.   furosemide 40 MG tablet Commonly known as:  LASIX Take 1 tablet (40 mg total) by mouth daily. What changed:    medication strength  how much to take   gabapentin 300 MG capsule Commonly known as:  NEURONTIN Take 2 capsules (600 mg total) by mouth 3 (three) times daily.   glucose blood test strip Commonly known as:  ACCU-CHEK AVIVA PLUS Use as instructed for 3 times daily testing of blood glucose. E.11.9   HYDROcodone-acetaminophen 5-325 MG tablet Commonly known as:  NORCO/VICODIN Take 1 tablet by mouth every 4 (four) hours as needed for moderate pain.   insulin aspart 100 UNIT/ML FlexPen Commonly known as:  NOVOLOG Inject 1 Units into the skin 3 (three) times daily with meals. Sliding scale; 12 units max per day   Insulin Pen Needle 32G X 4 MM Misc Commonly known as:  ULTICARE MICRO PEN NEEDLES 1 applicator by Does not apply route at bedtime.   ipratropium-albuterol 0.5-2.5 (3) MG/3ML Soln Commonly known as:  DUONEB Take 3 mLs by nebulization 3 (three) times daily for 6 days.   isosorbide-hydrALAZINE 20-37.5 MG tablet Commonly known as:  BIDIL Take 1 tablet by mouth 3 (three) times daily.   lactobacillus acidophilus & bulgar chewable tablet Chew 1 tablet by mouth 3 (three) times daily with meals.   lamoTRIgine 100 MG tablet Commonly known as:  LAMICTAL Take 1 tablet (100 mg total) by mouth daily.   losartan 50 MG tablet Commonly known as:  COZAAR Take 1 tablet (50 mg total) by mouth daily.   mupirocin ointment 2 % Commonly known as:  BACTROBAN Apply to any area that appears infected 2X daily   ondansetron 4 MG tablet Commonly known as:  ZOFRAN Take 1 tablet (4 mg total) by mouth every 6 (six) hours as needed for nausea.   polyethylene glycol packet Commonly known as:  MIRALAX / GLYCOLAX Take 17 g by mouth daily as  needed for mild constipation.   pravastatin 20 MG tablet Commonly known as:  PRAVACHOL Take 1 tablet (20 mg total) by mouth daily.   senna-docusate 8.6-50 MG tablet Commonly known as:  Senokot-S Take 1 tablet by mouth 2 (two) times daily.   triamcinolone cream 0.1 % Commonly known as:  KENALOG Apply 1 application topically 2 (two) times daily.      Allergies  Allergen Reactions  . Erythromycin Anaphylaxis  . Biaxin [Clarithromycin] Other (See Comments)  . Feldene [Piroxicam] Diarrhea, Nausea And Vomiting and Other (See Comments)    And sores in mouth  . Nsaids Other (See Comments)    Contraindicated because of her other meds  . Tetracyclines & Related   . Latex Rash  . Penicillins Rash and Other (See  Comments)    Has patient had a PCN reaction causing immediate rash, facial/tongue/throat swelling, SOB or lightheadedness with hypotension: Yes Has patient had a PCN reaction causing severe rash involving mucus membranes or skin necrosis: No Has patient had a PCN reaction that required hospitalization No Has patient had a PCN reaction occurring within the last 10 years: No If all of the above answers are "NO", then may proceed with Cephalosporin use. And sores in her mouth   . Tape Rash   Discharge Instructions    Diet - low sodium heart healthy   Complete by:  As directed    Discharge instructions   Complete by:  As directed    It is important that you read following instructions as well as go over your medication list with RN to help you understand your care after this hospitalization.  Discharge Instructions: Please follow-up with PCP in one week  Please request your primary care physician to go over all Hospital Tests and Procedure/Radiological results at the follow up,  Please get all Hospital records sent to your PCP by signing hospital release before you go home.   Do not take more than prescribed Pain, Sleep and Anxiety Medications. You were cared for by a  hospitalist during your hospital stay. If you have any questions about your discharge medications or the care you received while you were in the hospital after you are discharged, you can call the unit and ask to speak with the hospitalist on call if the hospitalist that took care of you is not available.  Once you are discharged, your primary care physician will handle any further medical issues. Please note that NO REFILLS for any discharge medications will be authorized once you are discharged, as it is imperative that you return to your primary care physician (or establish a relationship with a primary care physician if you do not have one) for your aftercare needs so that they can reassess your need for medications and monitor your lab values. You Must read complete instructions/literature along with all the possible adverse reactions/side effects for all the Medicines you take and that have been prescribed to you. Take any new Medicines after you have completely understood and accept all the possible adverse reactions/side effects. Wear Seat belts while driving. If you have smoked or chewed Tobacco in the last 2 yrs please stop smoking and/or stop any Recreational drug use.   Increase activity slowly   Complete by:  As directed      Discharge Exam: Filed Weights   09/25/17 0544 09/26/17 0500 09/27/17 7412  Weight: 100.7 kg (222 lb 1.6 oz) 101.2 kg (223 lb 1.6 oz) 100.1 kg (220 lb 9.6 oz)   Vitals:   09/27/17 0835 09/27/17 1400  BP:  129/71  Pulse:  93  Resp:  18  Temp:  98.2 F (36.8 C)  SpO2: 98% 100%   General: Appear in no distress, no Rash; Oral Mucosa moist. Cardiovascular: S1 and S2 Present, no Murmur, no JVD Respiratory: Bilateral Air entry present and Clear to Auscultation, no Crackles, no wheezes Abdomen: Bowel Sound present, Soft and no tenderness Extremities: no Pedal edema, no calf tenderness Neurology: Grossly no focal neuro deficit.  The results of significant  diagnostics from this hospitalization (including imaging, microbiology, ancillary and laboratory) are listed below for reference.    Significant Diagnostic Studies: Dg Chest 2 View  Result Date: 09/22/2017 CLINICAL DATA:  Short of breath EXAM: CHEST - 2 VIEW COMPARISON:  None. FINDINGS: Mild  cardiac enlargement without heart failure or edema Left lower lobe infiltrate and small left effusion, suspicious for pneumonia. No acute skeletal lesion IMPRESSION: Left lower lobe infiltrate and small left effusion, suspicious for pneumonia Electronically Signed   By: Franchot Gallo M.D.   On: 09/22/2017 12:43   Dg Abd Portable 1v  Result Date: 09/27/2017 CLINICAL DATA:  Nausea and vomiting EXAM: PORTABLE ABDOMEN - 1 VIEW COMPARISON:  None. FINDINGS: Small bowel decompressed. Moderate proximal colonic fecal material, decompressed distally. Cholecystectomy clips. Spondylitic changes in the lower lumbar spine. No acute bone abnormality identified. IMPRESSION: Nonobstructive bowel gas pattern. Electronically Signed   By: Lucrezia Europe M.D.   On: 09/27/2017 10:54    Microbiology: Recent Results (from the past 240 hour(s))  Urine Culture     Status: Abnormal   Collection Time: 09/22/17 12:48 PM  Result Value Ref Range Status   Specimen Description   Final    URINE, RANDOM Performed at [facility] [address] [place], [place] [address]    Special Requests   Final    NONE Performed at [facility], [place] [address], [place], [place] [address]    Culture (A)  Final    <10,000 COLONIES/mL INSIGNIFICANT GROWTH Performed at [facility] [address], [place], [place] [address]    Report Status 09/23/2017 FINAL  Final  Culture, blood (Routine X 2) w Reflex to ID Panel     Status: None (Preliminary result)   Collection Time: 09/22/17  1:22 PM  Result Value Ref Range Status   Specimen Description   Final    BLOOD RIGHT FOREARM Performed at [facility] [address], [place], [place] [address]    Special Requests   Final    BOTTLES DRAWN AEROBIC AND ANAEROBIC Blood Culture adequate volume Performed at [facility] [address] [place], [place] [address]    Culture   Final    NO GROWTH 4 DAYS Performed at [facility], [place] [address] [place], [place] [address]    Report Status PENDING  Incomplete  Culture, blood (Routine X 2) w Reflex to ID Panel     Status: None (Preliminary result)   Collection Time: 09/22/17  1:22 PM  Result Value Ref Range Status   Specimen Description   Final    BLOOD LEFT ANTECUBITAL Performed at [facility] [address] [place], [place] [address]    Special Requests   Final    BOTTLES DRAWN AEROBIC AND ANAEROBIC Blood Culture adequate volume Performed at [facility] [address], [place], [place] [address]    Culture   Final    NO GROWTH 4 DAYS Performed at [facility], [place] [address] [place], [place] [address]    Report Status PENDING  Incomplete  MRSA PCR Screening     Status: None   Collection Time: 09/23/17  8:01 AM  Result Value Ref Range Status   MRSA by PCR NEGATIVE NEGATIVE Final    Comment:        The GeneXpert MRSA Assay (FDA approved for NASAL specimens only), is one component of a comprehensive MRSA colonization surveillance program. It is not intended to diagnose MRSA infection nor to guide or monitor treatment for MRSA infections. Performed at [facility], [place] [address], [place],  [address]      Labs: CBC: Recent Labs  Lab 09/22/17 1322 09/23/17 0457 09/24/17  4492 09/25/17 0515 09/26/17 0455 09/27/17 0533  WBC 8.0 7.9 7.5 8.7 8.5 7.2  NEUTROABS 5.6  --  4.8 5.6 5.7 4.1  HGB 9.7* 9.0* 8.3* 8.9* 8.7* 8.2*  HCT 31.9* 30.3* 27.5* 29.3* 28.2* 27.1*  MCV 77.6* 78.1 77.9* 77.1* 76.8* 77.0*  PLT 264 256 244 276 279 524   Basic Metabolic  Panel: Recent Labs  Lab 09/23/17 0457 09/24/17 0508 09/25/17 0515 09/26/17 0455 09/27/17 0533  NA 139 137 136 136 140  K 5.1 4.6 4.6 4.3 4.2  CL 108 105 104 104 108  CO2 20* _0 GLUCOSE 141* 169* 108* 117* 101*  BUN 31* 35* 36* 40* 41*  CREATININE 1.60* 1.84* 1.69* 1.88* 1.86*  CALCIUM 8.2* 8.1* 8.4* 8.5* 8.4*  MG 1.8  --   --   --   --    Liver Function Tests: Recent Labs  Lab 09/22/17 1322  AST 33  ALT 33  ALKPHOS 77  BILITOT 0.5  PROT 7.0  ALBUMIN 3.2*   Recent Labs  Lab 09/22/17 1322  LIPASE 22   No results for input(s): AMMONIA in the last 168 hours. Cardiac Enzymes: Recent Labs  Lab 09/22/17 1322  TROPONINI <0.03   BNP (last 3 results) Recent Labs    09/04/17 1549 09/22/17 1322  BNP 978.5* 1,650.6*   CBG: Recent Labs  Lab 09/26/17 1135 09/26/17 1647 09/26/17 2157 09/27/17 0747 09/27/17 1130  GLUCAP 102* 155* 128* 88 121*   Time spent: 35 minutes  Signed:  Berle Mull  [facility]  09/27/2017  , 2:22 PM

## 2017-09-27 NOTE — Clinical Social Work Placement (Signed)
Patient received and accepted bed offer at [facility].  Facility aware of patient's discharge and confirmed patient's bed offer. PTAR contacted, patient aware and reported that she will contact her friend. Patient's RN can call report to [phone], packet complete. CSW signing off, no other needs identified at this time.  CLINICAL SOCIAL WORK PLACEMENT  NOTE  Date:  09/27/2017  Patient Details  Name: Diane [name] MRN: [id number] Date of Birth: 09-Jan-1951  Clinical Social Work is seeking post-discharge placement for this patient at the Skilled  Nursing Facility level of care (*CSW will initial, date and re-position this form in  chart as items are completed):  Yes   Patient/family provided with [facility] Clinical Social Work Department's list of facilities offering this level of care within the geographic area requested by the patient (or if unable, by the patient's family).  Yes   Patient/family informed of their freedom to choose among providers that offer the needed level of care, that participate in Medicare, Medicaid or managed care program needed by the patient, have an available bed and are willing to accept the patient.  Yes   Patient/family informed of 's ownership interest in [facility] and [facility], as well as of the fact that they are under no obligation to receive care at these facilities.  PASRR submitted to EDS on 09/25/17     PASRR number received on 09/26/17     Existing PASRR number confirmed on       FL2 transmitted to all facilities in geographic area requested by pt/family on       FL2 transmitted to all facilities within larger geographic area on       Patient informed that his/her managed care company has contracts with or will negotiate with certain facilities, including the following:        Yes   Patient/family informed of bed offers received.  Patient chooses bed at [facility]     Physician recommends and patient  chooses bed at      Patient to be transferred to [facility] on 09/27/17.  Patient to be transferred to facility by PTAR     Patient family notified on 09/27/17 of transfer.  Name of family member notified:  Patient aware and reported that she will notify her friend     PHYSICIAN       Additional Comment:    _______________________________________________ Antionette Poles, LCSW 09/27/2017, 3:48 PM

## 2017-09-27 NOTE — Telephone Encounter (Signed)
Mardene Celeste did prior auth for echo on 09/21/2017  Received fax on 09/21/2017  from [facility] stating the prior Berkley Harvey has been requested.  Contacted [employer] medic[employer] and procedure was approved  Authorization # [id number]

## 2017-09-27 NOTE — Progress Notes (Signed)
Pt discharged from the unit via wheelchair. Discharge instructions were included in packet for PTAR. PTAR transported pt to [facility]. Report called to RN. [facility] staff was informed of past hx of bed bugs and the agreement was to only bring computer and cell phone case when transporting. No questions or concerns at this time.

## 2017-10-04 ENCOUNTER — Ambulatory Visit (HOSPITAL_COMMUNITY): Payer: Medicare PPO | Admitting: Licensed Clinical Social Worker

## 2017-10-06 ENCOUNTER — Inpatient Hospital Stay (HOSPITAL_COMMUNITY)
Admission: EM | Admit: 2017-10-06 | Discharge: 2017-10-07 | DRG: 871 | Disposition: E | Payer: Medicare PPO | Attending: Pulmonary Disease | Admitting: Pulmonary Disease

## 2017-10-06 ENCOUNTER — Encounter (HOSPITAL_COMMUNITY): Payer: Self-pay

## 2017-10-06 ENCOUNTER — Other Ambulatory Visit: Payer: Self-pay

## 2017-10-06 ENCOUNTER — Emergency Department (HOSPITAL_COMMUNITY): Payer: Medicare PPO

## 2017-10-06 DIAGNOSIS — J9602 Acute respiratory failure with hypercapnia: Secondary | ICD-10-CM | POA: Diagnosis present

## 2017-10-06 DIAGNOSIS — G9340 Encephalopathy, unspecified: Secondary | ICD-10-CM | POA: Diagnosis present

## 2017-10-06 DIAGNOSIS — A419 Sepsis, unspecified organism: Secondary | ICD-10-CM | POA: Diagnosis present

## 2017-10-06 DIAGNOSIS — Z794 Long term (current) use of insulin: Secondary | ICD-10-CM | POA: Diagnosis not present

## 2017-10-06 DIAGNOSIS — Z9049 Acquired absence of other specified parts of digestive tract: Secondary | ICD-10-CM

## 2017-10-06 DIAGNOSIS — D631 Anemia in chronic kidney disease: Secondary | ICD-10-CM | POA: Diagnosis present

## 2017-10-06 DIAGNOSIS — J9601 Acute respiratory failure with hypoxia: Secondary | ICD-10-CM | POA: Diagnosis present

## 2017-10-06 DIAGNOSIS — I469 Cardiac arrest, cause unspecified: Secondary | ICD-10-CM | POA: Diagnosis present

## 2017-10-06 DIAGNOSIS — I5042 Chronic combined systolic (congestive) and diastolic (congestive) heart failure: Secondary | ICD-10-CM | POA: Diagnosis present

## 2017-10-06 DIAGNOSIS — J189 Pneumonia, unspecified organism: Secondary | ICD-10-CM | POA: Diagnosis present

## 2017-10-06 DIAGNOSIS — E785 Hyperlipidemia, unspecified: Secondary | ICD-10-CM | POA: Diagnosis present

## 2017-10-06 DIAGNOSIS — E872 Acidosis: Secondary | ICD-10-CM | POA: Diagnosis present

## 2017-10-06 DIAGNOSIS — Z90710 Acquired absence of both cervix and uterus: Secondary | ICD-10-CM | POA: Diagnosis not present

## 2017-10-06 DIAGNOSIS — D573 Sickle-cell trait: Secondary | ICD-10-CM | POA: Diagnosis present

## 2017-10-06 DIAGNOSIS — E1142 Type 2 diabetes mellitus with diabetic polyneuropathy: Secondary | ICD-10-CM | POA: Diagnosis present

## 2017-10-06 DIAGNOSIS — N183 Chronic kidney disease, stage 3 (moderate): Secondary | ICD-10-CM | POA: Diagnosis present

## 2017-10-06 DIAGNOSIS — Z7951 Long term (current) use of inhaled steroids: Secondary | ICD-10-CM

## 2017-10-06 DIAGNOSIS — M545 Low back pain: Secondary | ICD-10-CM | POA: Diagnosis present

## 2017-10-06 DIAGNOSIS — G8929 Other chronic pain: Secondary | ICD-10-CM | POA: Diagnosis present

## 2017-10-06 DIAGNOSIS — E1122 Type 2 diabetes mellitus with diabetic chronic kidney disease: Secondary | ICD-10-CM | POA: Diagnosis present

## 2017-10-06 DIAGNOSIS — Z9911 Dependence on respirator [ventilator] status: Secondary | ICD-10-CM

## 2017-10-06 DIAGNOSIS — I130 Hypertensive heart and chronic kidney disease with heart failure and stage 1 through stage 4 chronic kidney disease, or unspecified chronic kidney disease: Secondary | ICD-10-CM | POA: Diagnosis present

## 2017-10-06 DIAGNOSIS — R402432 Glasgow coma scale score 3-8, at arrival to emergency department: Secondary | ICD-10-CM | POA: Diagnosis present

## 2017-10-06 DIAGNOSIS — I272 Pulmonary hypertension, unspecified: Secondary | ICD-10-CM | POA: Diagnosis present

## 2017-10-06 DIAGNOSIS — K219 Gastro-esophageal reflux disease without esophagitis: Secondary | ICD-10-CM | POA: Diagnosis present

## 2017-10-06 DIAGNOSIS — M797 Fibromyalgia: Secondary | ICD-10-CM | POA: Diagnosis present

## 2017-10-06 DIAGNOSIS — E669 Obesity, unspecified: Secondary | ICD-10-CM | POA: Diagnosis present

## 2017-10-06 DIAGNOSIS — R0602 Shortness of breath: Secondary | ICD-10-CM

## 2017-10-06 LAB — I-STAT ARTERIAL BLOOD GAS, ED
ACID-BASE DEFICIT: 13 mmol/L — AB (ref 0.0–2.0)
Bicarbonate: 18.3 mmol/L — ABNORMAL LOW (ref 20.0–28.0)
O2 SAT: 96 %
PCO2 ART: 70 mmHg — AB (ref 32.0–48.0)
PH ART: 7.02 — AB (ref 7.350–7.450)
Patient temperature: 97.09
TCO2: 20 mmol/L — AB (ref 22–32)
pO2, Arterial: 116 mmHg — ABNORMAL HIGH (ref 83.0–108.0)

## 2017-10-06 LAB — COMPREHENSIVE METABOLIC PANEL
ALK PHOS: 91 U/L (ref 38–126)
ALT: 42 U/L (ref 14–54)
ANION GAP: 8 (ref 5–15)
AST: 75 U/L — ABNORMAL HIGH (ref 15–41)
Albumin: 2.8 g/dL — ABNORMAL LOW (ref 3.5–5.0)
BILIRUBIN TOTAL: 0.6 mg/dL (ref 0.3–1.2)
BUN: 30 mg/dL — ABNORMAL HIGH (ref 6–20)
CALCIUM: 8.3 mg/dL — AB (ref 8.9–10.3)
CO2: 21 mmol/L — AB (ref 22–32)
CREATININE: 1.96 mg/dL — AB (ref 0.44–1.00)
Chloride: 114 mmol/L — ABNORMAL HIGH (ref 101–111)
GFR calc Af Amer: 30 mL/min — ABNORMAL LOW (ref >60)
GFR calc non Af Amer: 26 mL/min — ABNORMAL LOW (ref >60)
GLUCOSE: 285 mg/dL — AB (ref 65–99)
Potassium: 4.8 mmol/L (ref 3.5–5.1)
SODIUM: 143 mmol/L (ref 135–145)
TOTAL PROTEIN: 6.2 g/dL — AB (ref 6.5–8.1)

## 2017-10-06 LAB — CBC
HEMATOCRIT: 33.7 % — AB (ref 36.0–46.0)
Hemoglobin: 9.6 g/dL — ABNORMAL LOW (ref 12.0–15.0)
MCH: 23 pg — AB (ref 26.0–34.0)
MCHC: 28.5 g/dL — ABNORMAL LOW (ref 30.0–36.0)
MCV: 80.6 fL (ref 78.0–100.0)
PLATELETS: 234 10*3/uL (ref 150–400)
RBC: 4.18 MIL/uL (ref 3.87–5.11)
RDW: 19.1 % — ABNORMAL HIGH (ref 11.5–15.5)
WBC: 15.3 10*3/uL — AB (ref 4.0–10.5)

## 2017-10-06 LAB — I-STAT TROPONIN, ED: Troponin i, poc: 0.01 ng/mL (ref 0.00–0.08)

## 2017-10-06 LAB — I-STAT CG4 LACTIC ACID, ED: Lactic Acid, Venous: 3.19 mmol/L (ref 0.5–1.9)

## 2017-10-06 LAB — PROCALCITONIN: Procalcitonin: 0.2 ng/mL

## 2017-10-06 LAB — BRAIN NATRIURETIC PEPTIDE: B Natriuretic Peptide: 663.4 pg/mL — ABNORMAL HIGH (ref 0.0–100.0)

## 2017-10-06 LAB — PROTIME-INR
INR: 1.19
Prothrombin Time: 15.1 seconds (ref 11.4–15.2)

## 2017-10-06 LAB — LACTIC ACID, PLASMA: Lactic Acid, Venous: 3.6 mmol/L (ref 0.5–1.9)

## 2017-10-06 MED ORDER — VASOPRESSIN 20 UNIT/ML IV SOLN
0.0300 [IU]/min | INTRAVENOUS | Status: DC
  Administered 2017-10-06: 0.03 [IU]/min via INTRAVENOUS
  Filled 2017-10-06: qty 2

## 2017-10-06 MED ORDER — SODIUM CHLORIDE 0.9 % IV SOLN
INTRAVENOUS | Status: AC | PRN
  Administered 2017-10-06: 1000 mL via INTRAVENOUS

## 2017-10-06 MED ORDER — EPINEPHRINE PF 1 MG/10ML IJ SOSY
PREFILLED_SYRINGE | INTRAMUSCULAR | Status: AC | PRN
  Administered 2017-10-06: 1 via INTRAVENOUS

## 2017-10-06 MED ORDER — NOREPINEPHRINE 4 MG/250ML-% IV SOLN
0.0000 ug/min | INTRAVENOUS | Status: DC
  Administered 2017-10-06: 5 ug/min via INTRAVENOUS

## 2017-10-06 MED ORDER — IPRATROPIUM-ALBUTEROL 0.5-2.5 (3) MG/3ML IN SOLN: 3.0000 mL | Freq: Four times a day (QID) | RESPIRATORY_TRACT | Status: DC

## 2017-10-06 MED ORDER — PANTOPRAZOLE SODIUM 40 MG PO PACK: 40.0000 mg | PACK | Freq: Every day | ORAL | Status: DC

## 2017-10-06 MED ORDER — SODIUM CHLORIDE 0.9 % IV SOLN: INTRAVENOUS | Status: DC

## 2017-10-06 MED ORDER — SODIUM CHLORIDE 0.9 % IV BOLUS: 500.0000 mL | Freq: Once | INTRAVENOUS | Status: DC

## 2017-10-06 MED ORDER — INSULIN ASPART 100 UNIT/ML ~~LOC~~ SOLN: 0.0000 [IU] | SUBCUTANEOUS | Status: DC

## 2017-10-06 MED FILL — Medication: Qty: 1 | Status: AC

## 2017-10-07 NOTE — Progress Notes (Signed)
Pharmacy Antibiotic Note  Diane [name] is a 67 y.o. female admitted on 10-22-17 with pneumonia. Pharmacy has been consulted for vancomycin and Zosyn dosing. WBC 15.3, currently AF. Scr 1.96 (BL ~1.6), estimated CrCl ~45 mL/min.  Plan: Vancomycin 2g IV x1, followed by vancomycin  IV q24h. Goal trough 15-12mcg/mL. Zosyn 3.375g IV q8h F/u C&S, clincal status, renal function, de-escalation, LOT, vancomycin levels as indicated  No data recorded.  Recent Labs  Lab 22-Oct-2017 1641 10/22/17 1645  WBC 15.3*  --   CREATININE 1.96*  --   LATICACIDVEN  --  3.19*    Estimated Creatinine Clearance: 31 mL/min (A) (by C-G formula based on SCr of 1.96 mg/dL (H)).    Allergies  Allergen Reactions  . Erythromycin Anaphylaxis  . Biaxin [Clarithromycin] Other (See Comments)  . Feldene [Piroxicam] Diarrhea, Nausea And Vomiting and Other (See Comments)    And sores in mouth  . Nsaids Other (See Comments)    Contraindicated because of her other meds  . Tetracyclines & Related   . Latex Rash  . Penicillins Rash and Other (See Comments)    Has patient had a PCN reaction causing immediate rash, facial/tongue/throat swelling, SOB or lightheadedness with hypotension: Yes Has patient had a PCN reaction causing severe rash involving mucus membranes or skin necrosis: No Has patient had a PCN reaction that required hospitalization No Has patient had a PCN reaction occurring within the last 10 years: No If all of the above answers are "NO", then may proceed with Cephalosporin use. And sores in her mouth   . Tape Rash    Antimicrobials this admission: Vanc 5/31 >>  Zosyn 5/31 >>   Dose adjustments this admission: None  Microbiology results: 5/31 BCx: pending 5/31 trach asp: pending  Thank you for allowing pharmacy to be a part of this patient's care.  Roderic Scarce Zigmund Daniel, PharmD PGY1 Pharmacy Resident Pager: [phone] 2017-10-22 5:59 PM

## 2017-10-07 NOTE — ED Notes (Signed)
Pt placed in body bag and all medical eq removed.  Toe tag placed and body bag zipped and tapped with pt sticker. 1 ring was left on pts LEFT ring finger due to unable to get it off pts finger.  1 silver ring was placed in cup and given too security along with pts clothing and socks.  Same was labled with pt stickers.  Transporting pt to morgue at this time.

## 2017-10-07 NOTE — ED Notes (Signed)
Ice applied at [id number]

## 2017-10-07 NOTE — H&P (Signed)
PULMONARY / CRITICAL CARE MEDICINE   Name: Diane [name] MRN: [id number] DOB: Dec 25, 1950    ADMISSION DATE:  16-Oct-2017 CONSULTATION DATE:  10/16/2017  REFERRING MD:  Dr. 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 Recent admission 5/17-5/22 with HCAP  [facility]ed from [facility] after being found agonal and pulseless. Last known normal was 2 hours prior. When EMS arrived patient was PEA, given 3 EPI on route to hospital with return of ROSC, however, while transferring into ED patient lost pulses again briefly requiring 1 EPI and 2 minutes of CPR, this occurred again shortly after, regaining pulse again after 1 EPI and 2 minutes of CPR. CXR with bilateral Infiltrates. LA 3.19. Patient non-responsive. PCCM asked to admit.    PAST MEDICAL HISTORY :  She  has a past medical history of Anxiety, Arthritis, Childhood asthma, Chronic lower back pain, Claustrophobia, Complication of anesthesia, Depression, Diabetic polyneuropathy ([place]), Family history of adverse reaction to anesthesia, Fibromyalgia, GERD (gastroesophageal reflux disease), Headache, HLD (hyperlipidemia) (09/22/2017), Hypertension, Iron deficiency anemia (09/22/2017), Manic, depressive ([place]), Pneumonia, Sickle cell trait ([place]), Sleep apnea, and Type II diabetes mellitus ([place]).  PAST SURGICAL HISTORY: She  has a past surgical history that includes Foot surgery (Left, 2015); Foot surgery (Right, 04/2014); Tonsillectomy and adenoidectomy; Appendectomy; Laparoscopic cholecystectomy; Toe amputation (Bilateral); Open reduction internal fixation (orif) tibia/fibula fracture (Left); Fracture surgery; Abdominal hysterectomy; Cataract extraction w/ intraocular lens  implant, bilateral (Bilateral); and Calcaneal osteotomy (Left, 04/28/2016).  Allergies  Allergen Reactions  . Erythromycin  Anaphylaxis  . Biaxin [Clarithromycin] Other (See Comments)  . Feldene [Piroxicam] Diarrhea, Nausea And Vomiting and Other (See Comments)    And sores in mouth  . Nsaids Other (See Comments)    Contraindicated because of her other meds  . Tetracyclines & Related   . Latex Rash  . Penicillins Rash and Other (See Comments)    Has patient had a PCN reaction causing immediate rash, facial/tongue/throat swelling, SOB or lightheadedness with hypotension: Yes Has patient had a PCN reaction causing severe rash involving mucus membranes or skin necrosis: No Has patient had a PCN reaction that required hospitalization No Has patient had a PCN reaction occurring within the last 10 years: No If all of the above answers are "NO", then may proceed with Cephalosporin use. And sores in her mouth   . Tape Rash    No current facility-administered medications on file prior to encounter.    Current Outpatient Medications on File Prior to Encounter  Medication Sig  . acetaminophen (TYLENOL) 325 MG tablet Take 2 tablets (650 mg total) by mouth every 6 (six) hours as needed for mild pain (or Fever >/= 101).  . Blood Glucose Monitoring Suppl (ACCU-CHEK AVIVA PLUS) w/Device KIT Use as directed for 3 times daily testing of blood glucose. E11.9  . carvedilol (COREG) 6.25 MG tablet Take 1 tablet (6.25 mg total) by mouth 2 (two) times daily with a meal.  . chlorproMAZINE (THORAZINE) 25 MG tablet Take 1 tablet (25 mg total) by mouth at bedtime.  . clonazePAM (KLONOPIN) 0.5 MG tablet Take 1 tablet (0.5 mg total) by mouth 2 (two) times daily for 2 doses.  Marland Kitchen donepezil (ARICEPT) 5 MG tablet Take 1 tablet (5 mg total) by mouth at bedtime. (Patient not taking: Reported on 09/22/2017)  . ergocalciferol (VITAMIN D2) 50000 units  capsule Take 1 capsule (50,000 Units total) by mouth once a week. (Patient not taking: Reported on 09/22/2017)  . famotidine (PEPCID) 20 MG tablet Take 1 tablet (20 mg total) by mouth 2 (two) times  daily. (Patient not taking: Reported on 09/22/2017)  . ferrous sulfate (FERROUSUL) 325 (65 FE) MG tablet Take 1 tablet (325 mg total) by mouth daily with breakfast. (Patient not taking: Reported on 09/22/2017)  . furosemide (LASIX) 40 MG tablet Take 1 tablet (40 mg total) by mouth daily.  Marland Kitchen gabapentin (NEURONTIN) 300 MG capsule Take 2 capsules (600 mg total) by mouth 3 (three) times daily.  Marland Kitchen glucose blood (ACCU-CHEK AVIVA PLUS) test strip Use as instructed for 3 times daily testing of blood glucose. E.11.9  . HYDROcodone-acetaminophen (NORCO/VICODIN) 5-325 MG tablet Take 1 tablet by mouth every 4 (four) hours as needed for moderate pain.  Marland Kitchen insulin aspart (NOVOLOG) 100 UNIT/ML FlexPen Inject 1 Units into the skin 3 (three) times daily with meals. Sliding scale; 12 units max per day  . Insulin Pen Needle (ULTICARE MICRO PEN NEEDLES) 32G X 4 MM MISC 1 applicator by Does not apply route at bedtime.  Marland Kitchen ipratropium-albuterol (DUONEB) 0.5-2.5 (3) MG/3ML SOLN Take 3 mLs by nebulization 3 (three) times daily for 6 days.  . isosorbide-hydrALAZINE (BIDIL) 20-37.5 MG tablet Take 1 tablet by mouth 3 (three) times daily.  Marland Kitchen lactobacillus acidophilus & bulgar (LACTINEX) chewable tablet Chew 1 tablet by mouth 3 (three) times daily with meals. (Patient not taking: Reported on 09/22/2017)  . lamoTRIgine (LAMICTAL) 100 MG tablet Take 1 tablet (100 mg total) by mouth daily.  Marland Kitchen losartan (COZAAR) 50 MG tablet Take 1 tablet (50 mg total) by mouth daily.  . mupirocin ointment (BACTROBAN) 2 % Apply to any area that appears infected 2X daily (Patient not taking: Reported on 09/22/2017)  . ondansetron (ZOFRAN) 4 MG tablet Take 1 tablet (4 mg total) by mouth every 6 (six) hours as needed for nausea.  . polyethylene glycol (MIRALAX / GLYCOLAX) packet Take 17 g by mouth daily as needed for mild constipation.  . pravastatin (PRAVACHOL) 20 MG tablet Take 1 tablet (20 mg total) by mouth daily. (Patient not taking: Reported on  09/22/2017)  . senna-docusate (SENOKOT-S) 8.6-50 MG tablet Take 1 tablet by mouth 2 (two) times daily.  Marland Kitchen triamcinolone cream (KENALOG) 0.1 % Apply 1 application topically 2 (two) times daily. (Patient not taking: Reported on 09/22/2017)    FAMILY HISTORY:  Her indicated that her mother is deceased. She indicated that her father is deceased. She indicated that her sister is deceased. She indicated that both of her brothers are alive.   SOCIAL HISTORY: She  reports that she has never smoked. She has never used smokeless tobacco. She reports that she does not drink alcohol or use drugs.  REVIEW OF SYSTEMS:   Unable to review as patient is intubated/encephalopathic     SUBJECTIVE:   VITAL SIGNS: There were no vitals taken for this visit.  HEMODYNAMICS:    VENTILATOR SETTINGS:    INTAKE / OUTPUT: No intake/output data recorded.  PHYSICAL EXAMINATION: General:  Chronically ill adult female, on vent  Neuro:  Pupils intact, does not follow commands, does not respond to physical/verbal stimulation, -gag/cough  HEENT:  ETT in place  Cardiovascular:  Irregular, no MRG  Lungs:  Coarse breath sounds, no wheeze  Abdomen:  Obese, active bowel sounds  Musculoskeletal:  +1 BLE  Skin:  Warm, dry   LABS:  BMET No results for  input(s): NA, K, CL, CO2, BUN, CREATININE, GLUCOSE in the last 168 hours.  Electrolytes No results for input(s): CALCIUM, MG, PHOS in the last 168 hours.  CBC No results for input(s): WBC, HGB, HCT, PLT in the last 168 hours.  Coag's No results for input(s): APTT, INR in the last 168 hours.  Sepsis Markers Recent Labs  Lab Oct 16, 2017 1645  LATICACIDVEN 3.19*    ABG Recent Labs  Lab 2017-10-16 1721  PHART 7.020*  PCO2ART 70.0*  PO2ART 116.0*    Liver Enzymes No results for input(s): AST, ALT, ALKPHOS, BILITOT, ALBUMIN in the last 168 hours.  Cardiac Enzymes No results for input(s): TROPONINI, PROBNP in the last 168 hours.  Glucose No results for  input(s): GLUCAP in the last 168 hours.  Imaging Dg Chest Port 1 View  Result Date: 2017/10/16 CLINICAL DATA:  Status post intubation today.  Shortness of breath. EXAM: PORTABLE CHEST 1 VIEW COMPARISON:  PA and lateral chest 09/22/2016. FINDINGS: Endotracheal tube is in place the tip in good position just below the clavicular heads. NG tube side port is seen in the stomach. Bilateral airspace disease is worst in the right upper and mid lung zones. There is marked cardiomegaly. IMPRESSION: ETT and NG tube in good position. Bilateral airspace disease is worst in the right mid and upper chest and could be due to pneumonia and/or edema. Electronically Signed   By: Inge Rise M.D.   On: Oct 16, 2017 17:02     STUDIES:  CXR 5/31 > Endotracheal tube is in place the tip in good position just below the clavicular heads. NG tube side port is seen in the stomach. Bilateral airspace disease is worst in the right upper and mid lung zones. There is marked cardiomegaly. ECHO 5/31 >> CT Head 5/31 >>   CULTURES: Blood 5/31 >> Sputum 5/31 >> U/A 5/31 >>  ANTIBIOTICS: Vancomycin 5/31 >>  Zosyn 5/31 >>  SIGNIFICANT EVENTS: 5/31 > Presents to ED   LINES/TUBES: ETT 5/31 >> Right Subclavian CVC 5/31 >>   DISCUSSION: 67 year old female presents to ED s/p cardiac arrest. Now intubated and   ASSESSMENT / PLAN:  PULMONARY A: Acute Hypoxic/Hypercarbic Respiratory Failure in bilateral infiltrates concerning for HCAP  H/O FEV1 of 61,ratio of 83, DLCO 42 P:   Vent Support > 8cc/kg TV Trend ABG/CXR Pulmonary Hygiene  VAP Bundle   CARDIOVASCULAR A:  PEA Arrest  Systolic/Diastolic HF (EF 29-93, Z1IR, Moderate Pulmonary HTN, PA Pressure 43)  H/O HTN, HLD P:  Cardiac Monitoring Normothermia > Maintain MAP >70  ECHO pending  Trend Troponin   RENAL A:   Lactic Acidosis in setting of hypoperfusion vs sepsis  Stage 3 CKD P:   Trend BMP  Replace electrolytes as indicated    GASTROINTESTINAL A:   SUP H/O IBS  P:   NPO PPI  HEMATOLOGIC A:   Anemia of Chronic Kidney Disease  P:  Trend CBC   INFECTIOUS A:   Leukocytosis  Bilateral Infiltrates concerning for HCAP  Recent HCAP with left lower infiltrate  P:   Trend WBC and Fever Curve PAN Culture  Trend PCT and LA  Vancomycin/Zosyn   ENDOCRINE A:   DM    P:   Trend Glucose  SSI   NEUROLOGIC A:   Metabolic Encephalopathy vs anoxic injury  H/O Bipolar/Anxiety  P:   RASS goal: 0 CT Head pending  EEG pending  UDS pending    FAMILY  - Updates: HCPOA Cherron [place] updated via phone.  States patient would not want to be on life support however wishes to continue short term, does not want CPR/Defib/ACLS.   - Inter-disciplinary family meet or Palliative Care meeting due by: 10/10/2017   Hayden Pedro, AGACNP-BC [facility]  Pgr: [phone]  PCCM Pgr: [phone]

## 2017-10-07 NOTE — Progress Notes (Signed)
Critical ABG values given to Waldron Session, MD at bedside.  Vent changes made by ED & CCM doctors.

## 2017-10-07 NOTE — ED Provider Notes (Signed)
[facility] Provider Note   CSN: [id number] Arrival date & time: 10-30-2017  1627     History   Chief Complaint Chief Complaint  Patient presents with  . Cardiac Arrest    HPI Diane [name] is a 67 y.o. female.  HPI  Patient comes in via EMS from her facility where she was last seen normal at 1300.  At 1500, patient was found to be pulseless over a trash can with emesis in the trash can.  Patient has a history of heart failure with a severely depressed EF on last echo.  EMS began CPR patient was given 3 rounds of epinephrine with return of spontaneous circulation.  Just prior to arrival, EMS state they once again lost pulses and CPR was continued into the emergency department.  Patient given 1 epi with return of pulses and was intubated without sedation or paralytic.  Shortly thereafter, patient once again lost pulses and ACLS was continued for approximately 2 minutes before spontaneous circulation resumed.  Past Medical History:  Diagnosis Date  . Anxiety   . Arthritis    "everywhere" (04/26/2016)  . Childhood asthma   . Chronic lower back pain   . Claustrophobia   . Complication of anesthesia    "had metallic taste in my mouth for a year after one of my ORs; next OR they changed some stuff; no problems since" (04/26/2016)  . Depression   . Diabetic polyneuropathy ([place])   . Family history of adverse reaction to anesthesia    "mom; don't have an idea what it was" (04/26/2016)  . Fibromyalgia   . GERD (gastroesophageal reflux disease)   . Headache    "related to stress" (04/26/2016)  . HLD (hyperlipidemia) 09/22/2017  . Hypertension   . Iron deficiency anemia 09/22/2017  . Manic, depressive ([place])    "severe" (04/26/2016)  . Pneumonia    "long time ago" (04/26/2016)  . Sickle cell trait ([place])   . Sleep apnea    "mask ordered; too claustrophobic to wear" (04/26/2016)  . Type II diabetes mellitus [facility])     Patient Active Problem  List   Diagnosis Date Noted  . Cardiac arrest ([place]) 2017-10-30  . HCAP (healthcare-associated pneumonia) 09/22/2017  . HLD (hyperlipidemia) 09/22/2017  . Iron deficiency anemia 09/22/2017  . Acute hypoxemic respiratory failure ([place]) 09/22/2017  . Polyarthralgia 09/04/2017  . Pre-ulcerative corn or callous 09/04/2017  . Cellulitis 07/24/2017  . Pain in left leg 04/20/2017  . Charcot foot due to diabetes mellitus ([place]) 02/21/2017  . Unilateral primary osteoarthritis, right knee 09/07/2016  . Pain in right hip 09/07/2016  . Vitamin D deficiency 07/25/2016  . Diabetic ulcer of right heel associated with type 2 diabetes mellitus, limited to breakdown of skin ([place]) 05/12/2016  . Osteomyelitis of ankle or foot   . Diabetic ulcer of left heel associated with type 2 diabetes mellitus, with bone involvement without evidence of necrosis ([place])   . Right foot ulcer, limited to breakdown of skin ([place]) 04/26/2016  . Hypertension 04/26/2016  . DM (diabetes mellitus) type II controlled with renal manifestation ([place]) 04/26/2016  . Fibromyalgia 04/26/2016  . Diabetic polyneuropathy ([place]) 04/26/2016    Past Surgical History:  Procedure Laterality Date  . ABDOMINAL HYSTERECTOMY     "partial"  . APPENDECTOMY    . CALCANEAL OSTEOTOMY Left 04/28/2016   Procedure: CALCANEAL OSTEOTOMY;  Surgeon: Newt Minion, MD;  Location: [facility];  Service: Orthopedics;  Laterality: Left;  . CATARACT EXTRACTION  W/ INTRAOCULAR LENS  IMPLANT, BILATERAL Bilateral   . FOOT SURGERY Left 2015  . FOOT SURGERY Right 04/2014   subtalar joint arthrodesis, talonavicualr joint arthrodesis, hammertoe repair 2,4,5 right foot  . FRACTURE SURGERY    . LAPAROSCOPIC CHOLECYSTECTOMY    . OPEN REDUCTION INTERNAL FIXATION (ORIF) TIBIA/FIBULA FRACTURE Left   . TOE AMPUTATION Bilateral    toe hallux   . TONSILLECTOMY AND ADENOIDECTOMY       OB History   None      Home Medications    Prior to Admission medications   Medication  Sig Start Date End Date Taking? Authorizing Provider  carvedilol (COREG) 6.25 MG tablet Take 1 tablet (6.25 mg total) by mouth 2 (two) times daily with a meal. 09/27/17  Yes Lavina Hamman, MD  chlorproMAZINE (THORAZINE) 25 MG tablet Take 1 tablet (25 mg total) by mouth at bedtime. 08/30/17  Yes Arfeen, Arlyce Harman, MD  donepezil (ARICEPT) 5 MG tablet Take 1 tablet (5 mg total) by mouth at bedtime. 05/13/16  Yes Argentina Donovan, PA-C  ergocalciferol (VITAMIN D2) 50000 units capsule Take 1 capsule (50,000 Units total) by mouth once a week. Patient taking differently: Take 50,000 Units by mouth every Friday.  05/13/16  Yes Freeman Caldron M, PA-C  famotidine (PEPCID) 20 MG tablet Take 1 tablet (20 mg total) by mouth 2 (two) times daily. 09/06/16  Yes Maren Reamer, MD  ferrous sulfate (FERROUSUL) 325 (65 FE) MG tablet Take 1 tablet (325 mg total) by mouth daily with breakfast. 09/06/17  Yes Ladell Pier, MD  furosemide (LASIX) 40 MG tablet Take 1 tablet (40 mg total) by mouth daily. Patient taking differently: Take 40 mg by mouth 2 (two) times daily.  09/27/17  Yes Lavina Hamman, MD  gabapentin (NEURONTIN) 300 MG capsule Take 2 capsules (600 mg total) by mouth 3 (three) times daily. 08/07/17  Yes Newt Minion, MD  insulin aspart (NOVOLOG) 100 UNIT/ML FlexPen Inject 1 Units into the skin 3 (three) times daily with meals. Sliding scale; 12 units max per day Patient taking differently: Inject 2-12 Units into the skin 3 (three) times daily with meals. Per sliding scale: CBG 200-250 2 units, 251-300 4 units, 301-350 6 units, 351-400 8 units, 401-450 10 units, >451 12 units 09/21/16  Yes Langeland, Dawn T, MD  isosorbide-hydrALAZINE (BIDIL) 20-37.5 MG tablet Take 1 tablet by mouth 3 (three) times daily. 09/27/17  Yes Lavina Hamman, MD  lactobacillus acidophilus & bulgar (LACTINEX) chewable tablet Chew 1 tablet by mouth 3 (three) times daily with meals. 07/28/17  Yes Roxan Hockey, MD  acetaminophen (TYLENOL)  325 MG tablet Take 2 tablets (650 mg total) by mouth every 6 (six) hours as needed for mild pain (or Fever >/= 101). Patient not taking: Reported on Oct 13, 2017 07/28/17   Roxan Hockey, MD  Blood Glucose Monitoring Suppl (ACCU-CHEK AVIVA PLUS) w/Device KIT Use as directed for 3 times daily testing of blood glucose. E11.9 09/07/16   Maren Reamer, MD  clonazePAM (KLONOPIN) 0.5 MG tablet Take 1 tablet (0.5 mg total) by mouth 2 (two) times daily for 2 doses. 09/27/17 09/28/17  Lavina Hamman, MD  glucose blood (ACCU-CHEK AVIVA PLUS) test strip Use as instructed for 3 times daily testing of blood glucose. E.11.9 09/26/16   Maren Reamer, MD  HYDROcodone-acetaminophen (NORCO/VICODIN) 5-325 MG tablet Take 1 tablet by mouth every 4 (four) hours as needed for moderate pain. Patient not taking: Reported on 2017-10-13 09/27/17  Lavina Hamman, MD  Insulin Pen Needle (ULTICARE MICRO PEN NEEDLES) 32G X 4 MM MISC 1 applicator by Does not apply route at bedtime. 09/21/16   Langeland, Dawn T, MD  ipratropium-albuterol (DUONEB) 0.5-2.5 (3) MG/3ML SOLN Take 3 mLs by nebulization 3 (three) times daily for 6 days. 09/27/17 10/03/17  Lavina Hamman, MD  lamoTRIgine (LAMICTAL) 100 MG tablet Take 1 tablet (100 mg total) by mouth daily. Patient not taking: Reported on 10-15-17 08/30/17   Arfeen, Arlyce Harman, MD  losartan (COZAAR) 50 MG tablet Take 1 tablet (50 mg total) by mouth daily. 08/21/17   Ladell Pier, MD  mupirocin ointment (BACTROBAN) 2 % Apply to any area that appears infected 2X daily Patient not taking: Reported on 09/22/2017 06/01/17   Argentina Donovan, PA-C  ondansetron (ZOFRAN) 4 MG tablet Take 1 tablet (4 mg total) by mouth every 6 (six) hours as needed for nausea. 09/04/17   Ladell Pier, MD  polyethylene glycol [facility] / Floria Raveling) packet Take 17 g by mouth daily as needed for mild constipation. 09/27/17   Lavina Hamman, MD  pravastatin (PRAVACHOL) 20 MG tablet Take 1 tablet (20 mg total) by  mouth daily. Patient not taking: Reported on 09/22/2017 09/06/16   [name] Mussel T, MD  senna-docusate (SENOKOT-S) 8.6-50 MG tablet Take 1 tablet by mouth 2 (two) times daily. 09/27/17   Lavina Hamman, MD  triamcinolone cream (KENALOG) 0.1 % Apply 1 application topically 2 (two) times daily. Patient not taking: Reported on 09/22/2017 06/01/17   Argentina Donovan, PA-C    Family History History reviewed. No pertinent family history.  Social History Social History   Tobacco Use  . Smoking status: Never Smoker  . Smokeless tobacco: Never Used  Substance Use Topics  . Alcohol use: No  . Drug use: No     Allergies   Erythromycin; Biaxin [clarithromycin]; Feldene [piroxicam]; Nsaids; Tetracyclines & related; Latex; Penicillins; and Tape   Review of Systems Review of Systems  Unable to perform ROS: Patient unresponsive  All other systems reviewed and are negative.    Physical Exam Updated Vital Signs BP 110/76   Pulse 90   Resp (!) 24   SpO2 97%   Physical Exam  Constitutional: She appears well-developed and well-nourished.  HENT:  Head: Normocephalic and atraumatic.  Emesis over the face and in the oropharynx.  Eyes: Conjunctivae are normal.  Neck: Neck supple.  Cardiovascular: Normal rate and regular rhythm.  No murmur heard. Pulmonary/Chest:  Unresponsive no spontaneous respirations  Abdominal: Soft. There is no tenderness.  Musculoskeletal: She exhibits no edema.  Neurological:  Unresponsive GCS of 3  Skin: Skin is warm and dry.  Psychiatric:  GCS of 3  Nursing note and vitals reviewed.    ED Treatments / Results  Labs (all labs ordered are listed, but only abnormal results are displayed) Labs Reviewed  CBC - Abnormal; Notable for the following components:      Result Value   WBC 15.3 (*)    Hemoglobin 9.6 (*)    HCT 33.7 (*)    MCH 23.0 (*)    MCHC 28.5 (*)    RDW 19.1 (*)    All other components within normal limits  COMPREHENSIVE METABOLIC  PANEL - Abnormal; Notable for the following components:   Chloride 114 (*)    CO2 21 (*)    Glucose, Bld 285 (*)    BUN 30 (*)    Creatinine, Ser 1.96 (*)  Calcium 8.3 (*)    Total Protein 6.2 (*)    Albumin 2.8 (*)    AST 75 (*)    GFR calc non Af Amer 26 (*)    GFR calc Af Amer 30 (*)    All other components within normal limits  LACTIC ACID, PLASMA - Abnormal; Notable for the following components:   Lactic Acid, Venous 3.6 (*)    All other components within normal limits  BRAIN NATRIURETIC PEPTIDE - Abnormal; Notable for the following components:   B Natriuretic Peptide 663.4 (*)    All other components within normal limits  I-STAT CG4 LACTIC ACID, ED - Abnormal; Notable for the following components:   Lactic Acid, Venous 3.19 (*)    All other components within normal limits  I-STAT ARTERIAL BLOOD GAS, ED - Abnormal; Notable for the following components:   pH, Arterial 7.020 (*)    pCO2 arterial 70.0 (*)    pO2, Arterial 116.0 (*)    Bicarbonate 18.3 (*)    TCO2 20 (*)    Acid-base deficit 13.0 (*)    All other components within normal limits  CULTURE, RESPIRATORY (NON-EXPECTORATED)  CULTURE, BLOOD (ROUTINE X 2)  CULTURE, BLOOD (ROUTINE X 2)  PROTIME-INR  PROCALCITONIN  TROPONIN I  TROPONIN I  TROPONIN I  BASIC METABOLIC PANEL  BASIC METABOLIC PANEL  APTT  BLOOD GAS, ARTERIAL  BLOOD GAS, ARTERIAL  CBC  MAGNESIUM  PHOSPHORUS  LACTIC ACID, PLASMA  PROCALCITONIN  URINALYSIS, ROUTINE W REFLEX MICROSCOPIC  RAPID URINE DRUG SCREEN, HOSP PERFORMED  STREP PNEUMONIAE URINARY ANTIGEN  TROPONIN I  I-STAT TROPONIN, ED    EKG None  Radiology Dg Chest Port 1 View  Result Date: 2017/10/30 CLINICAL DATA:  Status post intubation today.  Shortness of breath. EXAM: PORTABLE CHEST 1 VIEW COMPARISON:  PA and lateral chest 09/22/2016. FINDINGS: Endotracheal tube is in place the tip in good position just below the clavicular heads. NG tube side port is seen in the stomach.  Bilateral airspace disease is worst in the right upper and mid lung zones. There is marked cardiomegaly. IMPRESSION: ETT and NG tube in good position. Bilateral airspace disease is worst in the right mid and upper chest and could be due to pneumonia and/or edema. Electronically Signed   By: Inge Rise M.D.   On: 10/30/2017 17:02    Procedures Procedure Name: Intubation Date/Time: 10-30-17 5:21 PM Performed by: Chapman Moss, MD Induction Type: Rapid sequence Ventilation: Mask ventilation without difficulty Laryngoscope Size: Mac and 3 Grade View: Grade II Tube size: 7.5 mm Number of attempts: 1 Airway Equipment and Method: Rigid stylet and Video-laryngoscopy Placement Confirmation: ETT inserted through vocal cords under direct vision,  CO2 detector and Breath sounds checked- equal and bilateral Secured at: 21 cm Dental Injury: Teeth and Oropharynx as per pre-operative assessment       (including critical care time)  Medications Ordered in ED Medications  sodium chloride 0.9 % bolus 500 mL ( Intravenous Canceled Entry 10/30/17 1706)  norepinephrine (LEVOPHED) 68m in D5W 2537mpremix infusion (0 mcg/min Intravenous Stopped 09/2017-06-24809)  0.9 %  sodium chloride infusion (has no administration in time range)  pantoprazole sodium (PROTONIX) 40 mg/20 mL oral suspension 40 mg (has no administration in time range)  vasopressin (PITRESSIN) 40 Units in sodium chloride 0.9 % 250 mL (0.16 Units/mL) infusion (has no administration in time range)  insulin aspart (novoLOG) injection 0-15 Units (has no administration in time range)  0.9 %  sodium chloride  infusion ( Intravenous Stopped Oct 21, 2017 1809)  EPINEPHrine (ADRENALIN) 1 MG/10ML injection (1 Syringe Intravenous Given 21-Oct-2017 1657)     Initial Impression / Assessment and Plan / ED Course  I have reviewed the triage vital signs and the nursing notes.  Pertinent labs & imaging results that were available during my care of the patient  were reviewed by me and considered in my medical decision making (see chart for details).     Patient found to have elevated lactic acid as well as leukocytosis.  Chest x-ray shows possible pneumonia versus edema.  Patient found to be hypercapnic on blood gas.  Patient was admitted to intensivist service, however while intensivist was placing lines, patient once again lost pulses and had just been made DNR by the family via phone conversation.  Patient expired in the emergency department, PCP to complete death certificate.  Final Clinical Impressions(s) / ED Diagnoses   Final diagnoses:  SOB (shortness of breath)    ED Discharge Orders    None       Chapman Moss, MD 10/07/17 3837    Lajean Saver, MD 10/07/17 [phone]

## 2017-10-07 NOTE — ED Triage Notes (Signed)
From [facility] facility for CPR x 2.  Facility last saw her at 1300 with pulses.  At 1500 facility found her pulseless over a trash can.  EMS gave 3 epi in route.  Pulses lost PTA and CPR continued into ED.  One Epi given by hospital staff on arrival to unit.  Pulses present after the EPI.  Pulses lost again at 1656 with another EPI given.  Pluses sustained after this point.

## 2017-10-07 DEATH — deceased

## 2017-10-09 MED ORDER — EPINEPHRINE PF 1 MG/10ML IJ SOSY
PREFILLED_SYRINGE | INTRAMUSCULAR | Status: AC | PRN
  Administered 2017-10-06: 1 via INTRAVENOUS

## 2017-10-11 ENCOUNTER — Ambulatory Visit (HOSPITAL_COMMUNITY): Payer: Self-pay | Admitting: Licensed Clinical Social Worker

## 2017-10-11 LAB — CULTURE, BLOOD (ROUTINE X 2)
CULTURE: NO GROWTH
Culture: NO GROWTH
SPECIAL REQUESTS: ADEQUATE

## 2017-10-18 ENCOUNTER — Ambulatory Visit (HOSPITAL_COMMUNITY): Payer: Self-pay | Admitting: Licensed Clinical Social Worker

## 2017-10-25 ENCOUNTER — Ambulatory Visit (HOSPITAL_COMMUNITY): Payer: Self-pay | Admitting: Licensed Clinical Social Worker

## 2017-11-01 ENCOUNTER — Ambulatory Visit (HOSPITAL_COMMUNITY): Payer: Self-pay | Admitting: Licensed Clinical Social Worker

## 2017-11-02 ENCOUNTER — Ambulatory Visit (HOSPITAL_COMMUNITY): Payer: Self-pay | Admitting: Psychiatry

## 2017-11-03 ENCOUNTER — Ambulatory Visit: Payer: Medicare PPO | Admitting: Neurology

## 2017-11-06 NOTE — Code Documentation (Signed)
After Arctic sun was applied by [facility] RN staff, patient went pulseless again.  Idolina PrimerK. Eubanks ACNP spoke previously with healthcare power of attorney who made patient a DNR.  No CPR preformed at this time. Patient declared dead by Oneida ArenasGray PCCM MD and Ivar DrapeK. Ebuanks ACNP at [phone] 06/10/17

## 2017-11-06 NOTE — ED Notes (Signed)
Arctic sun applied by [facility] RNs

## 2017-11-08 ENCOUNTER — Ambulatory Visit (HOSPITAL_COMMUNITY): Payer: Self-pay | Admitting: Licensed Clinical Social Worker

## 2017-11-15 ENCOUNTER — Ambulatory Visit (HOSPITAL_COMMUNITY): Payer: Self-pay | Admitting: Licensed Clinical Social Worker

## 2017-11-22 ENCOUNTER — Ambulatory Visit (HOSPITAL_COMMUNITY): Payer: Self-pay | Admitting: Licensed Clinical Social Worker

## 2017-11-29 ENCOUNTER — Ambulatory Visit (HOSPITAL_COMMUNITY): Payer: Self-pay | Admitting: Licensed Clinical Social Worker

## 2017-12-06 ENCOUNTER — Ambulatory Visit (HOSPITAL_COMMUNITY): Payer: Self-pay | Admitting: Licensed Clinical Social Worker

## 2019-09-08 IMAGING — MR MR FOOT*R* WO/W CM
5 of 9 series · 18 of 40 positions shown · IV contrast (Yes)
Comparison: Same day radiographs

CLINICAL DATA: Forefoot soft tissue swelling. Swelling of the
second toe with toe turning blue per patient.

EXAM:
MRI OF THE RIGHT FOREFOOT WITHOUT AND WITH CONTRAST
TECHNIQUE: Multiplanar, multisequence MR imaging of the right forefoot was
performed before and after the administration of intravenous
contrast.
CONTRAST:  10mL MULTIHANCE GADOBENATE DIMEGLUMINE 529 MG/ML IV SOLN

[Series 3: T1 · coronal · 4.0mm · 0.29mm/px · 5 of 30 slices shown (1 of 2)]
[im 1/30]
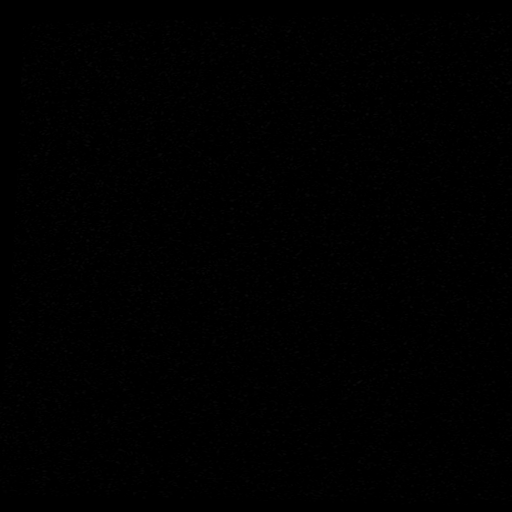
[im 8/30]
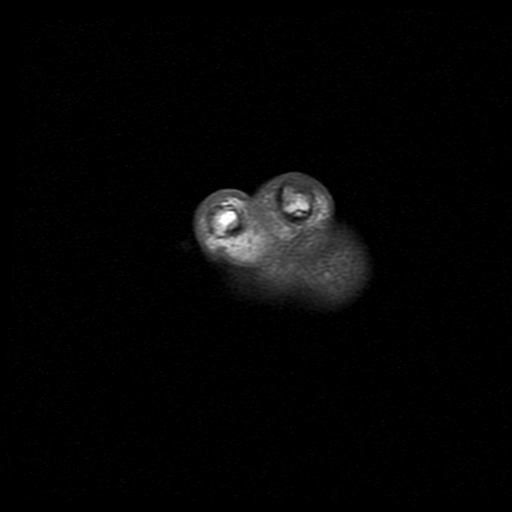
[im 15/30]
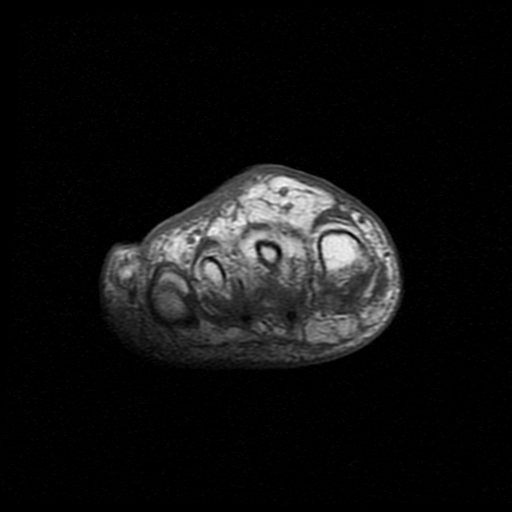
[im 22/30]
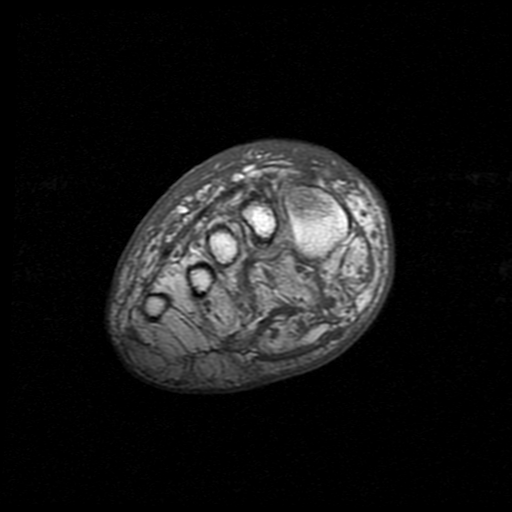
[im 30/30]
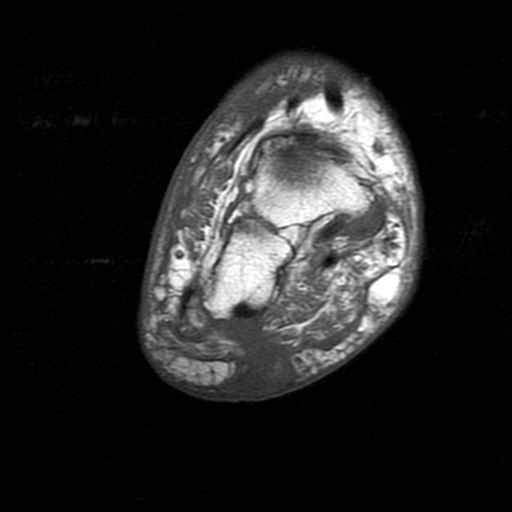

[Series 6: T1 · oblique · 4.0mm · 0.31mm/px · 3 of 18 slices shown (2 of 2)]
[im 1/18]
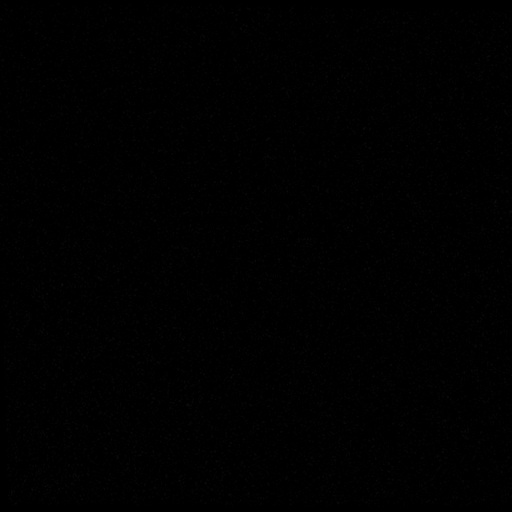
[im 9/18]
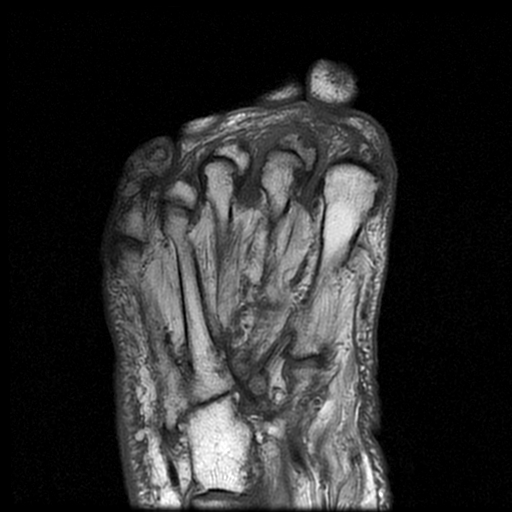
[im 18/18]
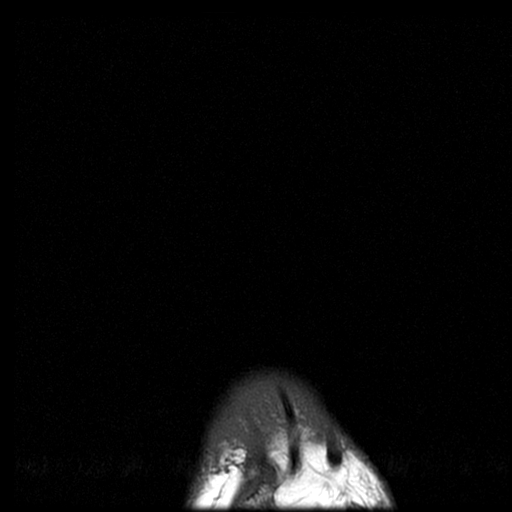

[Series 9: T1 fat-sat post-contrast · coronal · 4.0mm · 0.29mm/px · 6 of 36 slices shown]
[im 1/36]
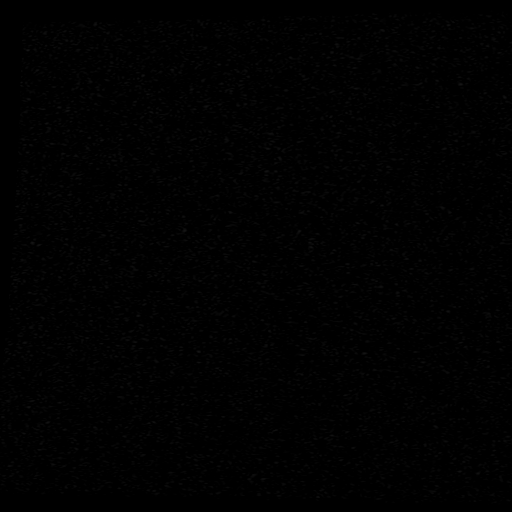
[im 8/36]
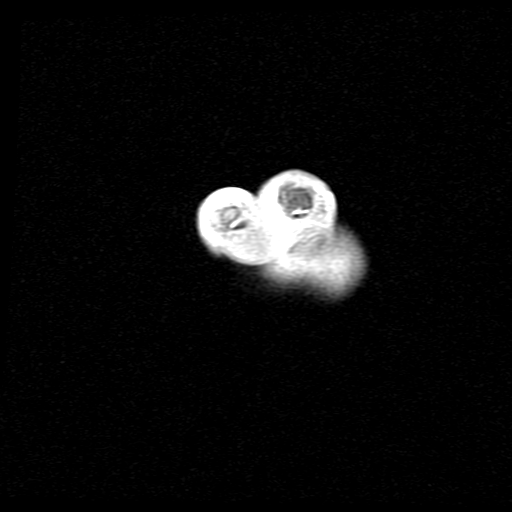
[im 15/36]
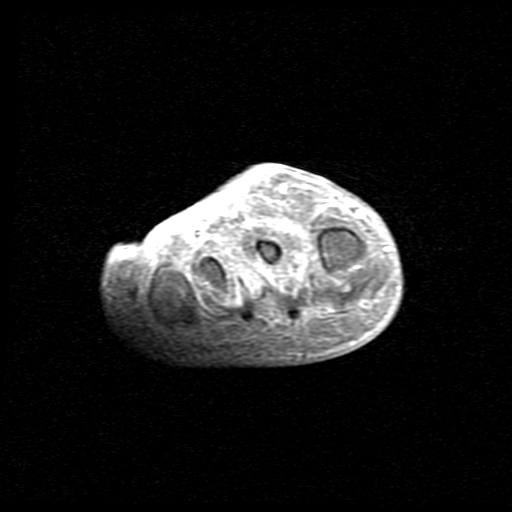
[im 22/36]
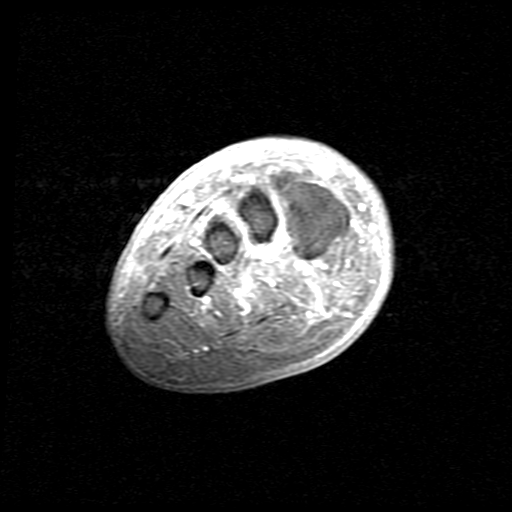
[im 29/36]
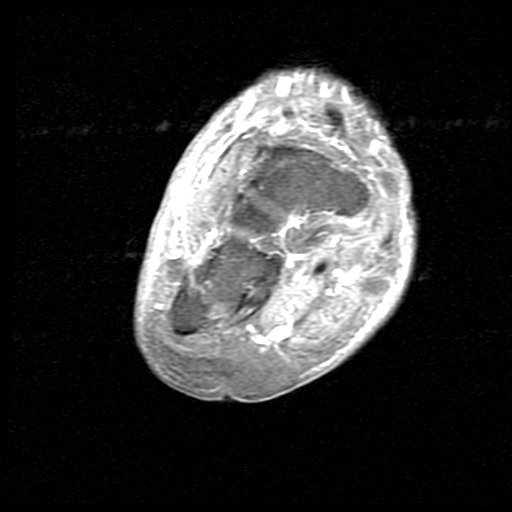
[im 36/36]
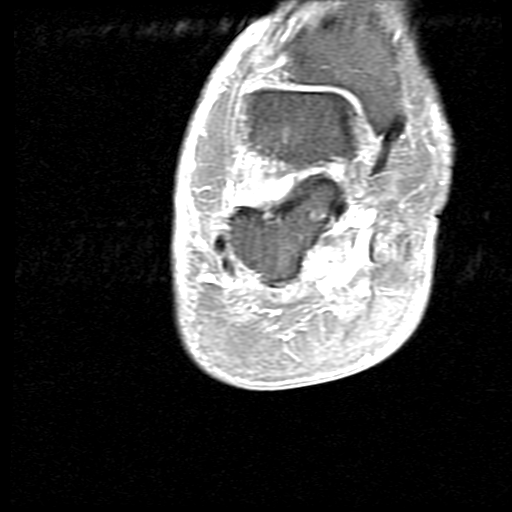

[Series 10: T1 post-contrast · oblique · 4.0mm · 0.31mm/px · 3 of 18 slices shown (1 of 2)]
[im 1/18]
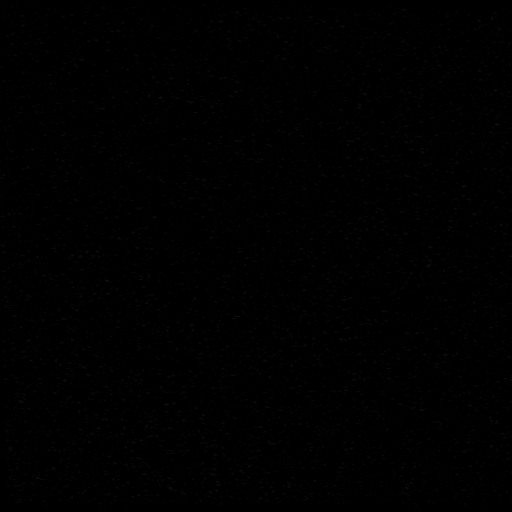
[im 9/18]
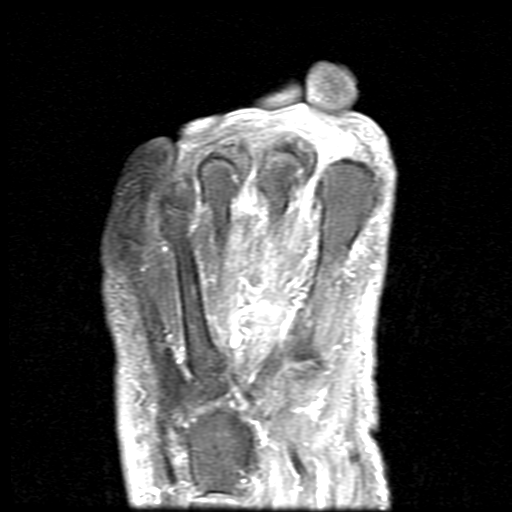
[im 18/18]
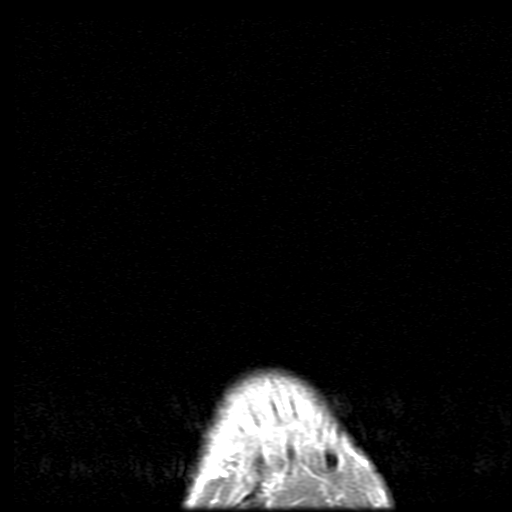

[Series 11: T1 post-contrast · sagittal · 4.0mm · 0.31mm/px · 1 of 21 slices shown (2 of 2)]
[im 1/21]
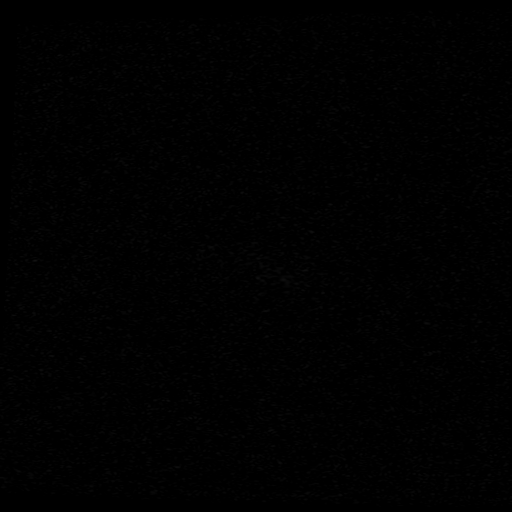

[18 of 40 positions shown; findings below may reference images not displayed]

FINDINGS: Bones/Joint/Cartilage

Previous great toe amputation at the level of the MTP.

Redemonstration of subchondral degenerative cystic change of the
midfoot articulations without acute osseous appearing abnormality
nor frank bone destruction. Degenerative joint space narrowing and
subchondral reactive edema is noted about the calcaneocuboid,
naviculocuneiform, intercuneiform and base of the TMT joints.
Osteoarthritic joint space narrowing of the DIP and PIP joints of
the second through fifth digits.

No conclusive evidence for acute osteomyelitis.

Ligaments

Noncontributory

Muscles and Tendons

Myositis of the plantar flexor muscles of the forefoot. No evidence
of a pyomyositis.

Soft tissues

Two small dorsal soft tissue abscesses measuring 9 x 5 x 5 mm and 15
x 5 x 10 mm are identified along the lateral as well as dorsal
aspect of the proximal second proximal phalanx. Moderate degree of
dorsal soft tissue edema consistent with cellulitis is identified of
the forefoot.
IMPRESSION: 1. Two small enhancing soft tissue abscesses overlying the dorsum
aspect of the left second distal phalanx and within the webspace
between the second and third toes measuring 9 x 5 x 5 mm in the
webspace and 15 x 5 x 10 mm over the dorsum of the toe without
underlying marrow signal abnormalities to suggest acute
osteomyelitis.
2. Moderate degree of soft tissue edema and swelling compatible
cellulitis.
3. Redemonstration of great toe amputation with overlying
cellulitis.
4. Redemonstration of mild to moderate midfoot osteoarthritic joint
space narrowing and reactive subchondral marrow edema and cystic
change.
5. Mild osteoarthritic joint space narrowing of the DIP and PIP
joints of the remaining second through fifth digits.
